# Patient Record
Sex: Female | Born: 1950 | Race: Black or African American | Hispanic: No | Marital: Married | Smoking: Former smoker
Health system: Southern US, Community
[De-identification: ages and names within clinical notes are randomized; demographics above are authoritative.]

## PROBLEM LIST (undated history)

## (undated) DIAGNOSIS — N179 Acute kidney failure, unspecified: Secondary | ICD-10-CM

## (undated) DIAGNOSIS — M7750 Other enthesopathy of unspecified foot: Secondary | ICD-10-CM

## (undated) DIAGNOSIS — D735 Infarction of spleen: Secondary | ICD-10-CM

## (undated) DIAGNOSIS — H269 Unspecified cataract: Secondary | ICD-10-CM

## (undated) DIAGNOSIS — M5030 Other cervical disc degeneration, unspecified cervical region: Secondary | ICD-10-CM

## (undated) DIAGNOSIS — R809 Proteinuria, unspecified: Secondary | ICD-10-CM

## (undated) DIAGNOSIS — Z87442 Personal history of urinary calculi: Secondary | ICD-10-CM

## (undated) DIAGNOSIS — D649 Anemia, unspecified: Secondary | ICD-10-CM

## (undated) DIAGNOSIS — M359 Systemic involvement of connective tissue, unspecified: Secondary | ICD-10-CM

## (undated) HISTORY — DX: Systemic involvement of connective tissue, unspecified: M35.9

## (undated) HISTORY — DX: Other cervical disc degeneration, unspecified cervical region: M50.30

## (undated) HISTORY — PX: CATARACT EXTRACTION, BILATERAL: SHX1313

## (undated) HISTORY — DX: Other enthesopathy of unspecified foot and ankle: M77.50

## (undated) HISTORY — DX: Unspecified cataract: H26.9

## (undated) HISTORY — PX: FOOT SURGERY: SHX648

---
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
## 2013-12-20 LAB — TSH: TSH: 1.33 u[IU]/mL (ref 0.41–5.90)

## 2013-12-20 LAB — CBC AND DIFFERENTIAL
HCT: 39 % (ref 36–46)
Hemoglobin: 12.3 g/dL (ref 12.0–16.0)
PLATELETS: 246 10*3/uL (ref 150–399)
WBC: 3.5 10*3/mL

## 2013-12-20 LAB — BASIC METABOLIC PANEL
BUN: 20 mg/dL (ref 4–21)
CREATININE: 0.8 mg/dL (ref 0.5–1.1)
GLUCOSE: 85 mg/dL
Potassium: 4.6 mmol/L (ref 3.4–5.3)
Sodium: 138 mmol/L (ref 137–147)

## 2013-12-20 LAB — HEPATIC FUNCTION PANEL
ALT: 13 U/L (ref 7–35)
AST: 15 U/L (ref 13–35)
Alkaline Phosphatase: 76 U/L (ref 25–125)

## 2014-06-17 ENCOUNTER — Other Ambulatory Visit: Payer: Self-pay | Admitting: Family Medicine

## 2015-01-16 ENCOUNTER — Other Ambulatory Visit: Payer: Self-pay | Admitting: Family Medicine

## 2015-01-17 ENCOUNTER — Other Ambulatory Visit: Payer: Self-pay | Admitting: Family Medicine

## 2015-01-17 ENCOUNTER — Telehealth: Payer: Self-pay | Admitting: Family Medicine

## 2015-01-17 NOTE — Telephone Encounter (Signed)
Patient is requesting a refill on Naproxen. She states that she takes it as needed. 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  Dr. Conley Rolls, I will pull this patient's chart and place in your box for review.   Thanks, [employer]

## 2015-01-18 NOTE — Telephone Encounter (Signed)
Jasmine-I have not seen the chart? Have you gotten it taken care of?  Thanks, Dr Conley RollsLe

## 2015-01-19 NOTE — Telephone Encounter (Signed)
Looks like MendonJasmine spoke with Pt. See previous message.

## 2015-01-19 NOTE — Telephone Encounter (Signed)
The chart is not filed back, or at the nurses station, or in Dr. Irwin BrakemanLe's box, nor is it in W/C @ 104.     Per Dr. Conley RollsLe - Aleve is the same thing as Naproxen.  Patient can take this.   It has been 11 months since last seen, if she is in need of additional treatment, she needs an OV.   Called patient LMOVM to call back.

## 2015-01-19 NOTE — Telephone Encounter (Signed)
[facility], not Dr. Irwin BrakemanLe's.  Last (and only) DOS is 03/06/2014. She fell and injured her wrist at work. [facility]?  I'll put the paper record in Dr. Irwin BrakemanLe's box, but I suspect the answer is that if she's still needing this, she needs to come in.

## 2015-01-19 NOTE — Telephone Encounter (Signed)
Patient returned call. Gave her the message from Dr. Conley RollsLe.

## 2015-01-19 NOTE — Telephone Encounter (Signed)
Hi, I distinctly remember putting her chart in your box on 01/17/2015 with the phone message attached to it. Perhaps medical records can help us figure this out?  Thanks, [employer]

## 2015-05-23 ENCOUNTER — Ambulatory Visit: Payer: Federal, State, Local not specified - PPO

## 2015-05-23 ENCOUNTER — Ambulatory Visit (INDEPENDENT_AMBULATORY_CARE_PROVIDER_SITE_OTHER): Payer: Federal, State, Local not specified - PPO | Admitting: Podiatry

## 2015-05-23 VITALS — BP 176/86 | HR 86 | Resp 18

## 2015-05-23 DIAGNOSIS — M21622 Bunionette of left foot: Secondary | ICD-10-CM | POA: Diagnosis not present

## 2015-05-23 DIAGNOSIS — R52 Pain, unspecified: Secondary | ICD-10-CM | POA: Diagnosis not present

## 2015-05-23 DIAGNOSIS — M205X9 Other deformities of toe(s) (acquired), unspecified foot: Secondary | ICD-10-CM

## 2015-05-23 NOTE — Progress Notes (Signed)
Subjective:    Patient ID: Madeline Harper, female    DOB: 1950/11/15, 65 y.o.   MRN: [id number]  HPI  65 year old female presents the office today with concerns of bilateral foot and the left >> and right. She states that she has a Taylor's bunion left side and she is previously seen in the podiatrist and she has tried shoe gear changes, padding, offloading without any resolution of symptoms and she is continuing to have pain at this time she'll have the one year removed. Recently she started to develop pain in between her fourth and fifth toes on her right foot. She states he had surgery to her fifth toe to shave the bone down and this helped for several years however she is turned have recurrence of pain at this time. She denies any recent injury or trauma. No swelling or redness. No claudication symptoms however she states that she is somewhat concerned about the circulation to her toes and she occasionally gets tingling. No other complaints at this time.  Review of Systems  All other systems reviewed and are negative.      Objective:   Physical Exam General: AAO x3, NAD  Dermatological: Skin is warm, dry and supple bilateral. Nails x 10 are well manicured; remaining integument appears unremarkable at this time. There are no open sores, no preulcerative lesions, no rash or signs of infection present.  Vascular: Dorsalis Pedis artery and Posterior Tibial artery pedal pulses are 2/4 bilateral with immedate capillary fill time. Pedal hair growth present. No varicosities and no lower extremity edema present bilateral. There is no pain with calf compression, swelling, warmth, erythema.   Neruologic: Grossly intact via light touch bilateral. Vibratory intact via tuning fork bilateral. Protective threshold with Semmes Wienstein monofilament intact to all pedal sites bilateral. Patellar and Achilles deep tendon reflexes 2+ bilateral. No Babinski or clonus noted bilateral.   Musculoskeletal: There is  a Taylor's bunion deformity present on the left side with exostosis as well as a palpable bursa off the lateral aspect of submetatarsal head. There is adductovarus of fourth and fifth toes with a right-sided worse the left. There is a small hyperkeratotic lesion on the right fourth interspace. There is no overlying edema, erythema, increase in warmth. There is no area pinpoint bony tenderness or pain the vibratory sensation. MMT 5/5, ROM WNL.   Gait: Unassisted, Nonantalgic.      Assessment & Plan:  65 year old female left symptomatic Taylor's bunion, right fourth and fifth adductovarus -Treatment options discussed including all alternatives, risks, and complications -Etiology of symptoms were discussed -Due to chronic liver x-rays now a new x-rays today. It does appear to be an exostosis off the lateral aspect of the fifth metatarsal head of the left side however there is no significant deformity to the metatarsal. Adductovarus and hammertoe deformity is also present.  -Discussed both conservative and surgical treatment options. At this time she was still off on surgery of the right-sided proceed with surgery in the left side with a Taylor's bunion. I discussed their exostectomy of the lateral aspect of submetatarsal head and possible osteotomy if worn tendon she went to proceed with this. Discussed with the postoperative course is variable depending on the procedure. We'll try for exostectomy alone. Discussed this is not a guarantee of resolution of symptoms there is a high chance of recurrence. -The incision placement as well as the postoperative course was discussed with the patient. I discussed risks of the surgery which include, but not limited to,  infection, bleeding, pain, swelling, need for further surgery, delayed or nonhealing, painful or ugly scar, numbness or sensation changes, over/under correction, recurrence, transfer lesions, further deformity, hardware failure, DVT/PE, loss of toe/foot.  Patient understands these risks and wishes to proceed with surgery. The surgical consent was reviewed with the patient all 3 pages were signed. No promises or guarantees were given to the outcome of the procedure. All questions were answered to the best of my ability. Before the surgery the patient was encouraged to call the office if there is any further questions. The surgery will be performed at the [facility] on an outpatient basis. -Given her symptoms and concern for vascular insufficiency Will order arterial studies Prior to surgery.  Ovid Curd, DPM

## 2015-05-23 NOTE — Patient Instructions (Signed)

## 2015-05-24 ENCOUNTER — Telehealth: Payer: Self-pay | Admitting: *Deleted

## 2015-05-24 NOTE — Telephone Encounter (Signed)
"  I'm calling because I was there yesterday.  I scheduled for my surgery on March 1st.  I just found out I have an appointment on March 3.  I already paid for tickets.  I'd like to reschedule if possible.  Please contact me.  I need to reschedule for the next available date.  Waiting to hear from you."

## 2015-05-24 NOTE — Telephone Encounter (Signed)
I'm returning your call.  We can reschedule your surgery to March 8th.  "March 8, that will be fine.  My husband reminded me we have a conference to go to.  Thank you so much."

## 2015-05-25 ENCOUNTER — Telehealth: Payer: Self-pay | Admitting: *Deleted

## 2015-05-25 DIAGNOSIS — R0989 Other specified symptoms and signs involving the circulatory and respiratory systems: Secondary | ICD-10-CM

## 2015-05-25 NOTE — Telephone Encounter (Addendum)
-----   Message from Vivi Barrack, DPM sent at 05/24/2015  3:41 PM EST ----- Can you order arterial studies? Thanks. 05/25/2015-I ASKED PT IF SHE was established with a cardiovascular doctor in [facility] and she stated no, and I offered to schedule with [facility] and pt accept.  Faxed orders to [facility].

## 2015-05-29 ENCOUNTER — Other Ambulatory Visit: Payer: Self-pay | Admitting: Podiatry

## 2015-05-29 DIAGNOSIS — R0989 Other specified symptoms and signs involving the circulatory and respiratory systems: Secondary | ICD-10-CM

## 2015-06-06 ENCOUNTER — Ambulatory Visit (HOSPITAL_COMMUNITY)
Admission: RE | Admit: 2015-06-06 | Discharge: 2015-06-06 | Disposition: A | Discharge status: Home / Self Care | Payer: Federal, State, Local not specified - PPO | Source: Ambulatory Visit | Attending: Podiatry | Admitting: Podiatry

## 2015-06-06 DIAGNOSIS — R938 Abnormal findings on diagnostic imaging of other specified body structures: Secondary | ICD-10-CM | POA: Diagnosis not present

## 2015-06-06 DIAGNOSIS — R0989 Other specified symptoms and signs involving the circulatory and respiratory systems: Secondary | ICD-10-CM | POA: Diagnosis not present

## 2015-06-06 DIAGNOSIS — R209 Unspecified disturbances of skin sensation: Secondary | ICD-10-CM | POA: Diagnosis not present

## 2015-06-07 ENCOUNTER — Encounter: Payer: Self-pay | Admitting: *Deleted

## 2015-06-07 ENCOUNTER — Telehealth: Payer: Self-pay | Admitting: *Deleted

## 2015-06-07 DIAGNOSIS — I739 Peripheral vascular disease, unspecified: Secondary | ICD-10-CM

## 2015-06-07 NOTE — Telephone Encounter (Signed)
I'm calling to inform you Dr. Ardelle Anton got your doppler results back.  He said over all your doppler study was normal.  However, your toe pressures were abnormal.  He wants to get medical clearance from the vascular doctor before he performs your surgery so we may have to postpone your surgery.  I sent a medical clearance letter to Dr. Eden Emms requesting the clearance.  "Okay that's fine.  Will I get to see him before I have surgery to go over the study in detail?"  Yes, Dr. Ardelle Anton said he wants to see you again prior to having surgery.  "So we'll schedule that at a later date.  That is fine."

## 2015-06-12 NOTE — Telephone Encounter (Signed)
"  I was called last Thursday.  I was told when I had my vascular, they saw some abnormalities in my toes.  They may have to reschedule my surgery.  No one has called me since.  My surgery is Wednesday.  I have no idea of what's going on.  Please give me a call and let me know what's going on."

## 2015-06-12 NOTE — Telephone Encounter (Signed)
"  Patient has been calling here all day.  She's at Dr. Landry DykeKelly's office.  She said she is unclear of what's supposed to be going on.  Can I connect you to her?"  Yes, I'll take the call.    Mrs. Eves your surgery has been canceled for Wednesday.  Dr. Ardelle AntonWagoner wants to make sure you have adequate blood flow to your foot before he does surgery.  I sent a letter to Dr. Eden EmmsNishan.  He said he can't give clearance because he's never seen you before.  So I'm going to send a letter to Dr. Gery PrayBarry and see if he'll give the okay.  I'm not sure he will or not without seeing you.  "I feel like I was left in limbo.  I don't know what's going on.  I have to make arrangements.  I've already taken time off from work.  I have people arranged to help me.  How do we get this process going?  I'm just frustrated.  So Dr. Tresa EndoKelly has nothing to do with all this?"  No, Dr. Tresa EndoKelly is not involved.  If you want I can send a referral to Dr. Allyson SabalBerry and get them to call and make you an appointment.  "When will he be able to see me?"  I'm not sure they will have to schedule you for whatever he has available.  "Do I need to call them and get this taken care of?  We need to get this moving."  I have to make the referral.  "So, you're going to take care of this?"  Yes, I'm getting ready to send the referral.  They'll call you to set up the appointment.

## 2015-06-14 ENCOUNTER — Encounter: Payer: Self-pay | Admitting: Podiatry

## 2015-06-14 ENCOUNTER — Encounter: Payer: Self-pay | Admitting: Cardiovascular Disease

## 2015-06-14 ENCOUNTER — Ambulatory Visit (INDEPENDENT_AMBULATORY_CARE_PROVIDER_SITE_OTHER): Payer: Federal, State, Local not specified - PPO | Admitting: Cardiovascular Disease

## 2015-06-14 VITALS — BP 150/90 | HR 98 | Ht 59.0 in | Wt 135.8 lb

## 2015-06-14 DIAGNOSIS — Z01818 Encounter for other preprocedural examination: Secondary | ICD-10-CM | POA: Diagnosis not present

## 2015-06-14 DIAGNOSIS — M779 Enthesopathy, unspecified: Secondary | ICD-10-CM | POA: Diagnosis not present

## 2015-06-14 DIAGNOSIS — M7750 Other enthesopathy of unspecified foot: Secondary | ICD-10-CM | POA: Insufficient documentation

## 2015-06-14 NOTE — Patient Instructions (Signed)
Medication Instructions:  Your physician recommends that you continue on your current medications as directed. Please refer to the Current Medication list given to you today.   Labwork: none  Testing/Procedures: none  Follow-Up: Follow up with Dr. Berry as needed.   Any Other Special Instructions Will Be Listed Below (If Applicable).     If you need a refill on your cardiac medications before your next appointment, please call your pharmacy.   

## 2015-06-14 NOTE — Assessment & Plan Note (Signed)
This Madeline Harper has a painful foot spur on her left foot. 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 She had Dopplers performed in our office revealed a right ABI of 1.2 left ABI of 1. Her right TBI was 0.84 and left 0.59. She denies claudication. She has palpable pedal pulses. I believe she can undergo a left foot surgical procedure at low vascular risk.

## 2015-06-14 NOTE — Progress Notes (Signed)
06/14/2015 Madeline Harper   03-Aug-1950  [id number]  Primary Physician Pcp Not In System Primary Cardiologist: Runell Gess MD Roseanne Reno   HPI:  Madeline Harper is a 66 year old married African-American female mother of 2, grandmother and 5 grandchildren referred by Dr. Ardelle Anton, her podiatrist, for vascular evaluation prior to elective left foot surgery for a bone spur. She has no cardiovascular risk factors. Her sister did have a myocardial infarction. She has never had a heart attack or stroke. She denies chest pain, shortness of breath or claudication. She does have a painful left foot spur. 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 She had Dopplers in the office that showed normal ABIs with a slightly lower left  TBI. She has palpable pedal pulses.. I believe that she can undergo her surgical procedure at low vascular risk. I will see her back as needed.   Current Outpatient Prescriptions  Medication Sig Dispense Refill  . calcium carbonate (CALCIUM 600) 600 MG TABS tablet Take 600 mg by mouth daily.    . Multiple Vitamins-Minerals (MULTI ADULT GUMMIES PO) Take 1 tablet by mouth daily.    . naproxen sodium (ANAPROX) 220 MG tablet Take 220 mg by mouth as needed.     No current facility-administered medications for this visit.    No Known Allergies  Social History   Social History  . Marital Status: Married    Spouse Name: N/A  . Number of Children: N/A  . Years of Education: N/A   Occupational History  . Not on file.   Social History Main Topics  . Smoking status: Never Smoker   . Smokeless tobacco: Not on file  . Alcohol Use: Not on file  . Drug Use: Not on file  . Sexual Activity: Not on file   Other Topics Concern  . Not on file   Social History Narrative  . No narrative on file     Review of Systems: General: negative for chills, fever, night sweats or weight changes.  Cardiovascular: negative for chest pain, dyspnea on  exertion, edema, orthopnea, palpitations, paroxysmal nocturnal dyspnea or shortness of breath Dermatological: negative for rash Respiratory: negative for cough or wheezing Urologic: negative for hematuria Abdominal: negative for nausea, vomiting, diarrhea, bright red blood per rectum, melena, or hematemesis Neurologic: negative for visual changes, syncope, or dizziness All other systems reviewed and are otherwise negative except as noted above.    Blood pressure 150/90, pulse 98, height  (1.499 m), weight 135 lb 12.8 oz (61.598 kg).  General appearance: alert and no distress Neck: no adenopathy, no carotid bruit, no JVD, supple, symmetrical, trachea midline and thyroid not enlarged, symmetric, no tenderness/mass/nodules Lungs: clear to auscultation bilaterally Heart: regular rate and rhythm, S1, S2 normal, no murmur, click, rub or gallop Extremities: extremities normal, atraumatic, no cyanosis or edema  EKG normal sinus rhythm at 98 without ST or T-wave changes. I personally reviewed this EKG  ASSESSMENT AND PLAN:   Bone spur of foot This Corro has a painful foot spur on her left foot. 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 She had Dopplers performed in our office revealed a right ABI of 1.2 left ABI of 1. Her right TBI was 0.84 and left 0.59. She denies claudication. She has palpable pedal pulses. I believe she can undergo a left foot surgical procedure at low vascular risk.      Runell Gess MD  Nicholes CalamityFACP,FACC,FAHA, FSCAI 06/14/2015 2:43 PM

## 2015-06-14 NOTE — Progress Notes (Signed)
Patient ID: Lester KinsmanDiane Harper, female   DOB: 06-05-1950, 65 y.o.   MRN: [id number] Last Wednesday, 3/1 I reviewed the vascular studies and the TBI is abnormal. As I am working on the toes for surgery I am hesitant on preforming this surgery without any clearance. On 06/07/15 I discussed this with our surgery coordinator, Madeline Harper in regards to setting up a referral and delaying surgery.

## 2015-06-15 ENCOUNTER — Telehealth: Payer: Self-pay | Admitting: *Deleted

## 2015-06-15 NOTE — Telephone Encounter (Signed)
I'm returning your call.  He can do your surgery on Wednesday of next week.  "That will be good, the sooner the better.  What time will I need to be there?"  Surgical center will call you a day or two prior to surgery date and give you the arrival time.  Do not eat or drink anything after midnight.  "Okay, thank you so much."

## 2015-06-15 NOTE — Telephone Encounter (Signed)
"  I'm calling about the scheduling of my surgery.  I got clearance from Dr. Allyson SabalBerry.  I want to know when I'll be able to have the surgery done on my foot.  I need it taken care of ASAP.  If you would give me a call back."

## 2015-06-15 NOTE — Addendum Note (Signed)
Addended by: Evans LanceSTOVER, Donni Oglesby W on: 06/15/2015 01:22 PM   Modules accepted: Orders

## 2015-06-19 ENCOUNTER — Telehealth: Payer: Self-pay | Admitting: *Deleted

## 2015-06-19 NOTE — Telephone Encounter (Signed)
"  I'm scheduled for surgery on Wednesday with Dr. Ardelle AntonWagoner.  What time am I supposed to be there?"  You need to call the surgical center.  There phone number is [phone].  "I don't call you, I call them?"  Yes, that is correct.

## 2015-06-21 ENCOUNTER — Encounter: Payer: Self-pay | Admitting: Podiatry

## 2015-06-21 DIAGNOSIS — M2012 Hallux valgus (acquired), left foot: Secondary | ICD-10-CM | POA: Diagnosis not present

## 2015-06-26 ENCOUNTER — Telehealth: Payer: Self-pay | Admitting: *Deleted

## 2015-06-26 NOTE — Telephone Encounter (Signed)
Pt states [facility] an app[facility], but doesn't know the location.  I gave pt the [address], [place], [place] [id number].

## 2015-06-28 ENCOUNTER — Encounter: Payer: Self-pay | Admitting: Podiatry

## 2015-06-28 ENCOUNTER — Ambulatory Visit (INDEPENDENT_AMBULATORY_CARE_PROVIDER_SITE_OTHER): Payer: Federal, State, Local not specified - PPO | Admitting: Podiatry

## 2015-06-28 ENCOUNTER — Ambulatory Visit (HOSPITAL_BASED_OUTPATIENT_CLINIC_OR_DEPARTMENT_OTHER)
Admission: RE | Admit: 2015-06-28 | Discharge: 2015-06-28 | Disposition: A | Discharge status: Home / Self Care | Payer: Federal, State, Local not specified - PPO | Source: Ambulatory Visit | Attending: Podiatry | Admitting: Podiatry

## 2015-06-28 DIAGNOSIS — M21622 Bunionette of left foot: Secondary | ICD-10-CM | POA: Insufficient documentation

## 2015-06-28 DIAGNOSIS — Z9889 Other specified postprocedural states: Secondary | ICD-10-CM

## 2015-06-28 NOTE — Progress Notes (Signed)
Patient ID: Lester KinsmanDiane Salser, female   DOB: [birth year], 65 y.o.   MRN: [id number]  Subjective: Lester KinsmanDiane Galluzzo is a 65 y.o. is seen today in office s/p left tailors bunionecotmy (exostectomy) preformed on 06/21/15. She states that overall she is doing well she's having no pain. She stopped taking Percocet about 3 defect of surgery and she has stopped taking ibuprofen. She is continued surgical shoe. He states that she was having pain to her right fifth toe ever since the surgery on the left with the pain is also resolved. No recent injury or trauma. No other complaints. Denies any systemic complaints such as fevers, chills, nausea, vomiting. No calf pain, chest pain, shortness of breath.   Objective: General: No acute distress, AAOx3  DP/PT pulses palpable 2/4, CRT < 3 sec to all digits.  Protective sensation intact. Motor function intact.  Right foot: Incision is well coapted without any evidence of dehiscence and sutures intact. There is no surrounding erythema, ascending cellulitis, fluctuance, crepitus, malodor, drainage/purulence. There is very minimal edema around the surgical site. There is no pain along the surgical site. She states she can "feel it" over the incision upon palpation but it does not hurt. No other areas of tenderness to bilateral lower extremities.  No other open lesions or pre-ulcerative lesions.  No pain with calf compression, swelling, warmth, erythema.   Assessment and Plan:  Status post right 5th metatarsal exostectomy for tailors bunion, doing well with no complications   -Treatment options discussed including all alternatives, risks, and complications -X-ray was ordered and reviewed. On this evaluation of the x-ray does appear to have a slightly more I am ankle for the fourth and fifth compared to x-rays that she brought the office however clinically she has significant improvement. There is no palpable bump present and there appears to be much better clinical result compared  to preoperatively. -Antibiotic ointment was applied followed by dry sterile dressing. Keep the dressing clean, dry, intact. -Continue surgical shoe. -Ice/elevation -Pain medication as needed. -Monitor for any clinical signs or symptoms of infection and DVT/PE and directed to call the office immediately should any occur or go to the ER. -Follow-up in 1 week for suture removal or sooner if any problems arise. In the meantime, encouraged to call the office with any questions, concerns, change in symptoms.   Ovid CurdMatthew Lucianna Ostlund, DPM

## 2015-07-05 ENCOUNTER — Ambulatory Visit (INDEPENDENT_AMBULATORY_CARE_PROVIDER_SITE_OTHER): Payer: Federal, State, Local not specified - PPO | Admitting: Podiatry

## 2015-07-05 ENCOUNTER — Encounter: Payer: Self-pay | Admitting: Podiatry

## 2015-07-05 VITALS — BP 156/99 | HR 94 | Resp 18

## 2015-07-05 DIAGNOSIS — M21622 Bunionette of left foot: Secondary | ICD-10-CM

## 2015-07-05 DIAGNOSIS — Z9889 Other specified postprocedural states: Secondary | ICD-10-CM

## 2015-07-05 NOTE — Progress Notes (Signed)
Patient ID: Madeline KinsmanDiane Zane, female   DOB: 05/14/1950, 65 y.o.   MRN: [id number]  Subjective: Madeline KinsmanDiane Apfel is a 65 y.o. is seen today in office s/p left tailors bunionecotmy (exostectomy) preformed on 06/21/15. She states that she is doing well. She does continue the surgical shoe. She gets some throbbing sensation of the gets swollen but she ices it and does help. She is not taking any pain medicine. No other complaints. Denies any systemic complaints such as fevers, chills, nausea, vomiting. No calf pain, chest pain, shortness of breath.   Objective: General: No acute distress, AAOx3  DP/PT pulses palpable 2/4, CRT < 3 sec to all digits.  Protective sensation intact. Motor function intact.  Right foot: Incision is well coapted without any evidence of dehiscence and sutures intact. There is no surrounding erythema, ascending cellulitis, fluctuance, crepitus, malodor, drainage/purulence. There is very mild edema around the surgical site. There is no pain along the surgical site.  No other areas of tenderness to bilateral lower extremities.  No other open lesions or pre-ulcerative lesions.  No pain with calf compression, swelling, warmth, erythema.   Assessment and Plan:  Status post right 5th metatarsal exostectomy for tailors bunion, doing well with no complications   -Treatment options discussed including all alternatives, risks, and complications -Sutures removed today without complications. Antibiotic ointment was applied followed by dry sterile dressing. She continued the bandage off tomorrow to start to shower. Apply antibiotic ointment over the incision and a Band-Aid. Once the scar form she can use cocoa butter or vitamin E cream over the incision daily to help with scarring. -Continue with ice and elevation to help with swelling -Monitor for any clinical signs or symptoms of infection and DVT/PE and directed to call the office immediately should any occur or go to the ER. -Follow-up in 2-3  weeks or sooner if any problems arise. In the meantime, encouraged to call the office with any questions, concerns, change in symptoms.   Ovid CurdMatthew Dayja Loveridge, DPM

## 2015-07-12 ENCOUNTER — Telehealth: Payer: Self-pay | Admitting: *Deleted

## 2015-07-12 NOTE — Telephone Encounter (Signed)
Pt states had sutures removed last week and it looks like the edges of the surgical site have opened up.  I asked pt if she had any drainage, redness or increased swelling, pt denies these symptoms, and says she can see healthy skin beneath.  I told pt that often with surgery the outer layer of the suture line becomes dry, and opens due to the cutting of the supply line for blood and nutrients between the surgical sides of the wound. I told pt to cover with lightly coated antibiotic ointment and gause after she shower and report any changes.  I offer to check her foot but she stated she felt it was okay.

## 2015-07-19 ENCOUNTER — Ambulatory Visit (INDEPENDENT_AMBULATORY_CARE_PROVIDER_SITE_OTHER): Payer: Federal, State, Local not specified - PPO | Admitting: Podiatry

## 2015-07-19 ENCOUNTER — Encounter: Payer: Self-pay | Admitting: Podiatry

## 2015-07-19 ENCOUNTER — Ambulatory Visit (HOSPITAL_BASED_OUTPATIENT_CLINIC_OR_DEPARTMENT_OTHER)
Admission: RE | Admit: 2015-07-19 | Discharge: 2015-07-19 | Disposition: A | Discharge status: Home / Self Care | Payer: Federal, State, Local not specified - PPO | Source: Ambulatory Visit | Attending: Podiatry | Admitting: Podiatry

## 2015-07-19 VITALS — BP 160/99 | HR 73 | Resp 18

## 2015-07-19 DIAGNOSIS — M21622 Bunionette of left foot: Secondary | ICD-10-CM | POA: Insufficient documentation

## 2015-07-19 DIAGNOSIS — Z09 Encounter for follow-up examination after completed treatment for conditions other than malignant neoplasm: Secondary | ICD-10-CM | POA: Insufficient documentation

## 2015-07-19 DIAGNOSIS — Z9889 Other specified postprocedural states: Secondary | ICD-10-CM

## 2015-07-19 NOTE — Progress Notes (Signed)
Patient ID: Madeline KinsmanDiane Hengst, female   DOB: 10-Jan-1951, 65 y.o.   MRN: [id number]  Subjective: Madeline KinsmanDiane Tiffany is a 65 y.o. is seen today in office s/p left tailors bunionecotmy (exostectomy) preformed on 06/21/15. She states that she is doing well on the incision has opened up somewhat. She has been using antibiotic ointment and this seems to be helping and is closing. She does continue the surgical shoe. She denies any pain to the area at this time. She is not taking any pain medicine. No other complaints. Denies any systemic complaints such as fevers, chills, nausea, vomiting. No calf pain, chest pain, shortness of breath.   Objective: General: No acute distress, AAOx3  DP/PT pulses palpable 2/4, CRT < 3 sec to all digits.  Protective sensation intact. Motor function intact.  Right foot: All incision there is a superficial dehiscence of the wound which appears to be epidermis. The underlying skin appears to be intact. There is localized edema to the area any erythema or increase in warmth. There is no drainage or pus. No before meals cellulitis. There is no malodor. No clinical signs of infection at this time. No tenderness along the incision of the surgical site. No other areas of tenderness to bilateral lower extremities at this time. No other open lesions or pre-ulcerative lesions.  No pain with calf compression, swelling, warmth, erythema.   Assessment and Plan:  Status post right 5th metatarsal exostectomy for tailors bunion, with superficial dehiscence  -Treatment options discussed including all alternatives, risks, and complications -Wound was debrided today. Continue antibiotic ointment dressing changes. Monitor for signs or symptoms of infection. Continue with surgical shoe. -Follow-up as scheduled. Call any questions or concerns in the meantime.  Ovid CurdMatthew Nikolai Wilczak, DPM

## 2015-07-24 NOTE — Progress Notes (Signed)
[facility], Tailors Bunionectomy left foot.  Prescription was written for Percocet 5/325, quantity of 30, Phenergan 12.5 mg, quantity of 30 and Keflex 500 mg, quantity 21.

## 2015-08-02 ENCOUNTER — Encounter: Payer: Self-pay | Admitting: Podiatry

## 2015-08-02 ENCOUNTER — Ambulatory Visit (INDEPENDENT_AMBULATORY_CARE_PROVIDER_SITE_OTHER): Payer: Federal, State, Local not specified - PPO | Admitting: Podiatry

## 2015-08-02 DIAGNOSIS — M21622 Bunionette of left foot: Secondary | ICD-10-CM

## 2015-08-02 DIAGNOSIS — L84 Corns and callosities: Secondary | ICD-10-CM | POA: Diagnosis not present

## 2015-08-07 NOTE — Progress Notes (Signed)
Patient ID: Madeline Harper, female   DOB: 1950/08/28, 65 y.o.   MRN: [id number]  Subjective: Madeline Harper is a 65 y.o. is seen today in office s/p left tailors bunionecotmy (exostectomy) preformed on 06/21/15. She states that she is been applying antibiotic ointment overlying the incision and a bandage daily. She has not been seen drainage or pus. No significant increase in swelling. No redness or red streaks. No pain to the area. She also has a callus on the right big toe that she would have trimmed today. No recent injury or trauma. No other complaints.  Denies any systemic complaints such as fevers, chills, nausea, vomiting. No calf pain, chest pain, shortness of breath.   Objective: General: No acute distress, AAOx3  DP/PT pulses palpable 2/4, CRT < 3 sec to all digits.  Protective sensation intact. Motor function intact.  Left foot: On the incision on the superficial area of dehiscence is a hyperkeratotic lesion over the incision. Upon debridement underlying skin appears to be intact. There is no drainage or pus. There is trace edema around the area of the any increase in warmth or erythema. No ascending cellulitis. No malodor. There is no tenderness on the surgical site. Right foot: Hyperkeratotic lesion plantar hallux. Upon debridement no underlying ulceration, drainage or other signs of infection. No other open lesions or pre-ulcerative lesions.  No pain with calf compression, swelling, warmth, erythema.   Assessment and Plan:  Status post right 5th metatarsal exostectomy for tailors bunion, with healed wound: Right hyperkeratotic lesion  -Treatment options discussed including all alternatives, risks, and complications -Wound was debrided today. Certainly stress or over the incision. Hyperkeratotic tissue is slightly debrided today. Continue the bandages needed. -Callus right big toe debrided without complications or bleeding -At this point she can return back to work next week. Note  provided. -Follow-up as scheduled. Call any questions or concerns in the meantime.  Ovid CurdMatthew Taylah Dubiel, DPM

## 2015-08-30 ENCOUNTER — Ambulatory Visit: Payer: Federal, State, Local not specified - PPO | Admitting: Podiatry

## 2015-09-13 ENCOUNTER — Ambulatory Visit: Payer: Federal, State, Local not specified - PPO | Admitting: Podiatry

## 2016-02-27 DIAGNOSIS — M25511 Pain in right shoulder: Secondary | ICD-10-CM | POA: Insufficient documentation

## 2016-02-27 DIAGNOSIS — M25512 Pain in left shoulder: Secondary | ICD-10-CM

## 2016-02-27 DIAGNOSIS — R768 Other specified abnormal immunological findings in serum: Secondary | ICD-10-CM | POA: Insufficient documentation

## 2016-02-27 NOTE — Progress Notes (Signed)
Office Visit Note  Patient: Madeline Harper             Date of Birth: Apr 08, 1951           MRN: [id number]             PCP: Nilda Simmer, MD Referring: Tamala Julian, MD Visit Date: 02/28/2016 Occupation: Sales person    Subjective:  Pain in bilateral shoulders   History of Present Illness: Madeline Harper is a 65 y.o. right-handed black female seen in consultation per request of Dr. [place] Desanctis. Patient states that one year ago she started having pain and discomfort in her right shoulder for which she was seen by an orthopedic doctor. After the cortisone injection in her right shoulder joint symptoms resolved. She did well until 2 months ago. She states she woke up one day with pain and discomfort in her bilateral shoulders difficulty getting up from bed, dressing and raising her arms. She has severe nocturnal pain. She was seen by Dr. [place] Desanctis and had cortisone injection to bilateral shoulders. She states the symptoms were relieved only for a few days and then recurred. She was given a prednisone taper starting at 60 mg for 1 week. She states after the prednisone taper finished her symptoms recurred. She was given his second taper of prednisone at 60 mg for 2 weeks. She states she finished it today she is at 5 mg now her shoulder joint pain is much better and she can move her arms up without any difficulty. She states during this time she never developed any other joint involvement. She does have some problems with right second and third trigger finger. She denies any hip pain or any difficulty getting out of the chair.  Activities of Daily Living:  Patient reports morning stiffness for 2 hours.   Patient Reports nocturnal pain.  Difficulty dressing/grooming: Reports Difficulty climbing stairs: Denies Difficulty getting out of chair: Denies Difficulty using hands for taps, buttons, cutlery, and/or writing: Denies   Review of Systems  Constitutional: Positive for fatigue. Negative for night sweats,  weight gain, weight loss and weakness.  HENT: Negative for mouth sores, trouble swallowing, trouble swallowing, mouth dryness and nose dryness.   Eyes: Negative for pain, redness, visual disturbance and dryness.  Respiratory: Negative for cough, shortness of breath and difficulty breathing.   Cardiovascular: Negative for chest pain, palpitations, hypertension, irregular heartbeat and swelling in legs/feet.  Gastrointestinal: Negative for blood in stool, constipation and diarrhea.  Endocrine: Negative for increased urination.  Genitourinary: Negative for vaginal dryness.  Musculoskeletal: Positive for arthralgias, joint pain, myalgias and myalgias. Negative for joint swelling, muscle weakness, morning stiffness and muscle tenderness.  Skin: Negative for color change, rash, hair loss, skin tightness, ulcers and sensitivity to sunlight.  Allergic/Immunologic: Negative for susceptible to infections.  Neurological: Negative for dizziness, memory loss and night sweats.  Hematological: Negative for swollen glands.  Psychiatric/Behavioral: Positive for sleep disturbance. Negative for depressed mood. The patient is not nervous/anxious.     PMFS History:  Patient Active Problem List   Diagnosis Date Noted  . Elevated sedimentation rate 02/28/2016  . Positive ANA (antinuclear antibody) 02/27/2016  . Bilateral shoulder pain 02/27/2016  . Status post left foot surgery 06/28/2015  . Tailor's bunion of left foot 06/28/2015  . Bone spur of foot 06/14/2015    Past Medical History:  Diagnosis Date  . Bone spur of foot     Family History  Problem Relation Age of Onset  . Diabetes Mother   .  Heart disease Mother   . Kidney failure Mother   . Cancer Mother     Uterine Cancer  . Diabetes Maternal Grandmother   . Kidney failure Maternal Grandmother   . Stroke Sister   . Gout Maternal Grandfather    Past Surgical History:  Procedure Laterality Date  . [place]   Social History    Social History Narrative  . No narrative on file     Objective: Vital Signs: BP (!) 174/90 (BP Location: Right Arm, Patient Position: Sitting, Cuff Size: Small)   Pulse 85   Resp 14   Ht _0  (1.499 m)   Wt 137 lb (62.1 kg)   BMI 27.67 kg/m    Physical Exam  Constitutional: She is oriented to person, place, and time. She appears well-developed and well-nourished.  HENT:  Head: Normocephalic and atraumatic.  Eyes: Conjunctivae and EOM are normal.  Neck: Normal range of motion.  Cardiovascular: Normal rate, regular rhythm, normal heart sounds and intact distal pulses.   Pulmonary/Chest: Effort normal and breath sounds normal.  Abdominal: Soft. Bowel sounds are normal.  Lymphadenopathy:    She has no cervical adenopathy.  Neurological: She is alert and oriented to person, place, and time.  Skin: Skin is warm and dry. Capillary refill takes less than 2 seconds.  Psychiatric: She has a normal mood and affect. Her behavior is normal.  Nursing note and vitals reviewed.    Musculoskeletal Exam: C-spine, thoracic, lumbar spine good range of motion. Shoulder joints, elbow joints, wrist joints, MCPs PIPs DIPs with good range of motion with no synovitis. Hip joints knee joints ankle joints MTPs PIPs with good range of motion with no synovitis she has no difficulty getting out of chair.  CDAI Exam: No CDAI exam completed.    Investigation: Findings:  01/09/2016: ESR 115, C-reactive protein 1.5 normal, rheumatoid factor 15.9 high ,ANA > 1:1280 NS HLA-B27 negative, uric acid 7.4 high    Imaging: No results found.  Speciality Comments: No specialty comments available.    Procedures:  No procedures performed Allergies: Lyrica [pregabalin]   Assessment / Plan:     Visit Diagnoses: Elevated sedimentation rate: She has very high sedimentation rate. It is usually associated with polymyalgia rheumatica she gives history of pain in bilateral shoulder joints but none in her hip  joints. She denies any history of difficulty getting up from chair or muscle weakness or muscle pain. I would repeat her sedimentation rate today.  Positive ANA (antinuclear antibody) -her ANA is high titer she also gives history of arthritis but has no other clinical features of autoimmune disease I will obtain following labs today. Plan: CBC with Differential/Platelet, COMPLETE METABOLIC PANEL WITH GFR, Urinalysis, Routine w reflex microscopic (not at [facility]), Sedimentation rate, CK, TSH, Cyclic citrul peptide antibody, IgG, Sjogrens syndrome-B extractable nuclear antibody, Lupus anticoagulant panel, RNP Antibody, Sjogrens syndrome-A extractable nuclear antibody, Anti-scleroderma antibody, Anti-DNA antibody, double-stranded, C3 and C4, Glucose 6 phosphate dehydrogenase, Protein electrophoresis, serum, IgG, IgA, IgM, Hepatitis panel, acute  Pain of both shoulders: She's been having recurrent pain in her bilateral shoulders for the last 2 months. She has had 2 high-dose prednisone taper. She is doing fairly well today. I'll keep her on 10 mg of prednisone for right now until we have her lab results back. At this point my differential will be polymyalgia versus autoimmune disease. I've given her a handout on Plaquenil to review in case her labs come positive it may also be helpful  to taper prednisone in future.  Trigger finger, right middle finger - Right second and third : They're not very symptomatic currently.  Her blood pressure was elevated today> I have advised her to follow follow-up with PCP as the prednisone could be causing elevation of blood pressure.   Orders: Orders Placed This Encounter  Procedures  . CBC with Differential/Platelet  . COMPLETE METABOLIC PANEL WITH GFR  . Urinalysis, Routine w reflex microscopic (not at [facility])  . Sedimentation rate  . CK  . TSH  . Cyclic citrul peptide antibody, IgG  . Sjogrens syndrome-B extractable nuclear antibody  . Lupus anticoagulant panel  .  RNP Antibody  . Sjogrens syndrome-A extractable nuclear antibody  . Anti-scleroderma antibody  . Anti-DNA antibody, double-stranded  . C3 and C4  . Glucose 6 phosphate dehydrogenase  . Protein electrophoresis, serum  . IgG, IgA, IgM  . Hepatitis panel, acute   Meds ordered this encounter  Medications  . predniSONE (DELTASONE) 10 MG tablet    Sig: Take 1 tablet (10 mg total) by mouth daily with breakfast.    Dispense:  30 tablet    Refill:  0    Face-to-face time spent with patient was 60 minutes. 50% of time was spent in counseling and coordination of care.  Follow-Up Instructions: Return for [place]ias.   Bo Merino, MD

## 2016-02-28 ENCOUNTER — Telehealth: Payer: Self-pay | Admitting: Rheumatology

## 2016-02-28 ENCOUNTER — Encounter: Payer: Self-pay | Admitting: Rheumatology

## 2016-02-28 ENCOUNTER — Ambulatory Visit (INDEPENDENT_AMBULATORY_CARE_PROVIDER_SITE_OTHER): Payer: Federal, State, Local not specified - PPO | Admitting: Rheumatology

## 2016-02-28 VITALS — BP 174/90 | HR 85 | Resp 14 | Ht 59.0 in | Wt 137.0 lb

## 2016-02-28 DIAGNOSIS — G8929 Other chronic pain: Secondary | ICD-10-CM | POA: Diagnosis not present

## 2016-02-28 DIAGNOSIS — M65331 Trigger finger, right middle finger: Secondary | ICD-10-CM | POA: Diagnosis not present

## 2016-02-28 DIAGNOSIS — R700 Elevated erythrocyte sedimentation rate: Secondary | ICD-10-CM | POA: Diagnosis not present

## 2016-02-28 DIAGNOSIS — R768 Other specified abnormal immunological findings in serum: Secondary | ICD-10-CM

## 2016-02-28 DIAGNOSIS — M25512 Pain in left shoulder: Secondary | ICD-10-CM | POA: Diagnosis not present

## 2016-02-28 DIAGNOSIS — M25511 Pain in right shoulder: Secondary | ICD-10-CM

## 2016-02-28 LAB — CBC WITH DIFFERENTIAL/PLATELET
BASOS PCT: 0 %
Basophils Absolute: 0 cells/uL (ref 0–200)
EOS PCT: 1 %
Eosinophils Absolute: 78 cells/uL (ref 15–500)
HCT: 38 % (ref 35.0–45.0)
Hemoglobin: 12 g/dL (ref 11.7–15.5)
LYMPHS PCT: 8 %
Lymphs Abs: 624 cells/uL — ABNORMAL LOW (ref 850–3900)
MCH: 27.5 pg (ref 27.0–33.0)
MCHC: 31.6 g/dL — AB (ref 32.0–36.0)
MCV: 87.2 fL (ref 80.0–100.0)
MONOS PCT: 6 %
MPV: 10.7 fL (ref 7.5–12.5)
Monocytes Absolute: 468 cells/uL (ref 200–950)
Neutro Abs: 6630 cells/uL (ref 1500–7800)
Neutrophils Relative %: 85 %
PLATELETS: 282 10*3/uL (ref 140–400)
RBC: 4.36 MIL/uL (ref 3.80–5.10)
RDW: 15.7 % — AB (ref 11.0–15.0)
WBC: 7.8 10*3/uL (ref 3.8–10.8)

## 2016-02-28 LAB — TSH: TSH: 0.75 mIU/L

## 2016-02-28 MED ORDER — PREDNISONE 10 MG PO TABS: 10.0000 mg | ORAL_TABLET | Freq: Every day | ORAL | 0 refills | Status: DC

## 2016-02-28 NOTE — Patient Instructions (Signed)

## 2016-02-28 NOTE — Telephone Encounter (Signed)
[facility] in [facility] called about Prednisone rx that they just received. They have questions about the rx. Please call. [phone]

## 2016-02-28 NOTE — Telephone Encounter (Signed)
Clarified with pharmacy that 10mg  is the correct dose, reviewed Dr Corliss Skainseveshwar note and discussed with pharmacist

## 2016-02-29 LAB — COMPLETE METABOLIC PANEL WITH GFR
ALT: 30 U/L — AB (ref 6–29)
AST: 16 U/L (ref 10–35)
Albumin: 3.7 g/dL (ref 3.6–5.1)
Alkaline Phosphatase: 83 U/L (ref 33–130)
BUN: 18 mg/dL (ref 7–25)
CHLORIDE: 103 mmol/L (ref 98–110)
CO2: 23 mmol/L (ref 20–31)
CREATININE: 0.75 mg/dL (ref 0.50–0.99)
Calcium: 9.3 mg/dL (ref 8.6–10.4)
GFR, Est African American: >89 mL/min (ref >=60)
GFR, Est Non African American: 85 mL/min (ref >=60)
Glucose, Bld: 102 mg/dL — ABNORMAL HIGH (ref 65–99)
POTASSIUM: 4.4 mmol/L (ref 3.5–5.3)
Sodium: 140 mmol/L (ref 135–146)
Total Bilirubin: 0.5 mg/dL (ref 0.2–1.2)
Total Protein: 7.5 g/dL (ref 6.1–8.1)

## 2016-02-29 LAB — HEPATITIS PANEL, ACUTE
HCV Ab: NEGATIVE
HEP A IGM: NONREACTIVE
HEP B S AG: NEGATIVE
Hep B C IgM: NONREACTIVE

## 2016-02-29 LAB — CK: CK TOTAL: 28 U/L (ref 7–177)

## 2016-02-29 LAB — URINALYSIS, ROUTINE W REFLEX MICROSCOPIC
Bilirubin Urine: NEGATIVE
Glucose, UA: NEGATIVE
Hgb urine dipstick: NEGATIVE
KETONES UR: NEGATIVE
Leukocytes, UA: NEGATIVE
NITRITE: NEGATIVE
PH: 7 (ref 5.0–8.0)
Protein, ur: NEGATIVE
SPECIFIC GRAVITY, URINE: 1.018 (ref 1.001–1.035)

## 2016-02-29 LAB — SEDIMENTATION RATE: SED RATE: 38 mm/h — AB (ref 0–30)

## 2016-03-01 LAB — C3 AND C4
C3 Complement: 40 mg/dL — ABNORMAL LOW (ref 90–180)
C4 COMPLEMENT: 8 mg/dL — AB (ref 16–47)

## 2016-03-01 LAB — SJOGRENS SYNDROME-A EXTRACTABLE NUCLEAR ANTIBODY: SSA (Ro) (ENA) Antibody, IgG: 5.7 — ABNORMAL HIGH

## 2016-03-01 LAB — IGG, IGA, IGM
IGA: 383 mg/dL (ref 81–463)
IGG (IMMUNOGLOBIN G), SERUM: 2174 mg/dL — AB (ref 694–1618)
IGM, SERUM: 263 mg/dL (ref 48–271)

## 2016-03-01 LAB — ANTI-SCLERODERMA ANTIBODY: SCLERODERMA (SCL-70) (ENA) ANTIBODY, IGG: NEGATIVE

## 2016-03-01 LAB — SJOGRENS SYNDROME-B EXTRACTABLE NUCLEAR ANTIBODY: SSB (LA) (ENA) ANTIBODY, IGG: NEGATIVE

## 2016-03-01 LAB — GLUCOSE 6 PHOSPHATE DEHYDROGENASE: G-6PDH: 8 U/g{Hb} (ref 4.6–13.5)

## 2016-03-01 LAB — RNP ANTIBODY: Ribonucleic Protein(ENA) Antibody, IgG: >8 — ABNORMAL HIGH

## 2016-03-01 LAB — CYCLIC CITRUL PEPTIDE ANTIBODY, IGG

## 2016-03-01 LAB — ANTI-DNA ANTIBODY, DOUBLE-STRANDED: ds DNA Ab: 14 IU/mL — ABNORMAL HIGH

## 2016-03-02 LAB — RFX DRVVT SCR W/RFLX CONF 1:1 MIX: dRVVT Screen: 31 s (ref <=45)

## 2016-03-02 LAB — RFX PTT-LA W/RFX TO HEX PHASE CONF: PTT-LA Screen: 24 s (ref <=40)

## 2016-03-02 LAB — LUPUS ANTICOAGULANT PANEL

## 2016-03-04 LAB — PROTEIN ELECTROPHORESIS, SERUM
ALBUMIN ELP: 3.8 g/dL (ref 3.8–4.8)
Alpha-1-Globulin: 0.3 g/dL (ref 0.2–0.3)
Alpha-2-Globulin: 0.8 g/dL (ref 0.5–0.9)
BETA GLOBULIN: 0.4 g/dL (ref 0.4–0.6)
Beta 2: 0.4 g/dL (ref 0.2–0.5)
Gamma Globulin: 1.8 g/dL — ABNORMAL HIGH (ref 0.8–1.7)
TOTAL PROTEIN, SERUM ELECTROPHOR: 7.5 g/dL (ref 6.1–8.1)

## 2016-03-06 ENCOUNTER — Telehealth: Payer: Self-pay | Admitting: Radiology

## 2016-03-06 NOTE — Telephone Encounter (Signed)
-----   Message from Caffie DammeAmy W Littrell, RT sent at 03/05/2016  4:52 PM EST ----- Call patient and discuss PLQ per Dr Corliss Skainseveshwar

## 2016-03-06 NOTE — Telephone Encounter (Signed)
Dr Corliss Skainseveshwar wants us to discuss PLQ with patient. She states she would like for you to discuss the PLQ with her over the phone. I have called her to advise her the labs will be reviewed in detail in the office in follow up and are consistent with Lupus, and told her you will call with the information regarding new medication Dr Corliss Skainseveshwar has recommended.

## 2016-03-07 NOTE — Telephone Encounter (Signed)
Contacted patient regarding hydroxychloroquine.  Patient was counseled on the purpose, proper use, and adverse effects of hydroxychloroquine including nausea/diarrhea, skin rash, headaches, and sun sensitivity.  Discussed importance of annual eye exams while on hydroxychloroquine to monitor for ocular toxicity and discussed importance of frequent laboratory monitoring.  Answered patients questions regarding hydroxychloroquine.  Patient verbally agreed to use of hydroxychloroquine.  Will mail educational information to patient along with consent form.  Will also mail patient the plaquenil eye exam form.  Advised patient to mail consent back to our office once she receives it.  Patient voiced understanding.  Will initiate hydroxychloroquine once consent is received.     Lilla Shookachel Jayanth Szczesniak, Pharm.D., BCPS Clinical Pharmacist Pager: [phone] Phone: [phone] 03/07/2016 10:24 AM

## 2016-03-14 ENCOUNTER — Telehealth: Payer: Self-pay | Admitting: Pharmacist

## 2016-03-14 NOTE — Telephone Encounter (Signed)
I mailed patient information and consent on hydroxychloroquine on 03/07/16.  I called patient to follow up to ensure she received the letter and to see if she had any questions on the medication.  Patient did not answer the phone.  I left a voicemail requesting patient return my phone call.      [name], Pharm.D., BCPS Clinical Pharmacist Pager: [phone] Phone: [phone] 03/14/2016 1:20 PM

## 2016-03-14 NOTE — Telephone Encounter (Signed)
Returned patient's call.  Patient did confirm that she received the information and consent on hydroxychloroquine in the mail yesterday.  Patient appears anxious about starting a new medications.  Discussed the purpose, proper use, and adverse effects of hydroxychloroquine with patient.  Patient wanted to know why she needed to start a new medication as she states she is doing really well on prednisone right now.  Reviewed adverse effects of prednisone with patient including effect on blood glucose, blood pressure, risk of osteoporosis, and thinning of skin.  Reviewed with patient that we try to use prednisone in the lowest dose possible for the shortest duration possible.  Patient voiced understanding and reports she plans to mail hydroxychloroquine consent back to our office.  Advised patient to call if she has any other questions or concerns regarding hydroxychloroquine.    Lilla Shookachel Henderson, Pharm.D., BCPS Clinical Pharmacist Pager: [phone] Phone: [phone] 03/14/2016 1:52 PM

## 2016-03-14 NOTE — Telephone Encounter (Signed)
Patient returned Rachel's phone call. Please call back soon, patient states she is at work

## 2016-03-18 ENCOUNTER — Telehealth: Payer: Self-pay | Admitting: Rheumatology

## 2016-03-18 NOTE — Telephone Encounter (Signed)
Patient left a message for Fleet ContrasRachel stating that she sent forms in on Friday and if you have any questions please give her a call. Cb# [phone]

## 2016-03-20 ENCOUNTER — Telehealth: Payer: Self-pay | Admitting: Rheumatology

## 2016-03-20 NOTE — Telephone Encounter (Signed)
Patient left message asking for Fleet ContrasRachel to please call her regarding an authorization.

## 2016-03-20 NOTE — Telephone Encounter (Signed)
I called patient back.  Patient asked if we had received her consent form in the mail.  I informed her we have not received it yet, but we will let her know when we do.    Lilla Shookachel Stepan Verrette, Pharm.D., BCPS Clinical Pharmacist Pager: [phone] Phone: [phone] 03/20/2016 4:51 PM

## 2016-03-26 NOTE — Progress Notes (Signed)
Office Visit Note  Patient: Madeline Harper             Date of Birth: 14-Oct-1950           MRN: [id number]             PCP: Nilda Simmer, MD Referring: Kennon Holter, M.D. Visit Date: 03/27/2016 Occupation: Sales person    Subjective:  Bilateral shoulder pain   History of Present Illness: Madeline Harper is a 65 y.o. female she was recently evaluated for elevated sedimentation rate and arthralgias. She also had positive ANA. She had done a few prednisone taper with good response but recurrence of symptoms after each prednisone taper. She was started on prednisone 10 mg a day last visit and was left on that dose. She denies any joint pain on prednisone 10 mg a day. She continues to have some problems with Raynauds phenomenon. She denies any joint swelling. She continues to have some problems with right second and third trigger finger. She complains of pruritus in her left foot. She reports that about a year ago she developed upper respiratory infection which resolved but she continues to have some  cough with yellow sputum since then.  Activities of Daily Living:  Patient reports morning stiffness for 0 minute.   Patient Denies nocturnal pain.  Difficulty dressing/grooming: Denies Difficulty climbing stairs: Denies Difficulty getting out of chair: Denies Difficulty using hands for taps, buttons, cutlery, and/or writing: Denies   Review of Systems  Constitutional: Positive for fatigue and weight gain. Negative for night sweats, weight loss and weakness.  HENT: Negative for mouth sores, trouble swallowing, trouble swallowing, mouth dryness and nose dryness.   Eyes: Negative for pain, redness, visual disturbance and dryness.  Respiratory: Negative for cough, shortness of breath and difficulty breathing.   Cardiovascular: Negative for chest pain, palpitations, hypertension, irregular heartbeat and swelling in legs/feet.  Gastrointestinal: Negative for blood in stool, constipation and diarrhea.    Endocrine: Negative for increased urination.  Genitourinary: Negative for vaginal dryness.  Musculoskeletal: Positive for arthralgias and joint pain. Negative for joint swelling, myalgias, muscle weakness, morning stiffness, muscle tenderness and myalgias.  Skin: Negative for color change, rash, hair loss, skin tightness, ulcers and sensitivity to sunlight.  Allergic/Immunologic: Negative for susceptible to infections.  Neurological: Negative for dizziness, memory loss and night sweats.  Hematological: Negative for swollen glands.  Psychiatric/Behavioral: Negative for depressed mood and sleep disturbance. The patient is not nervous/anxious.     PMFS History:  Patient Active Problem List   Diagnosis Date Noted  . Autoimmune disease ([place]) 03/27/2016  . Bilateral foot pain 03/27/2016  . Trigger index finger of right hand 03/27/2016  . Elevated sedimentation rate 02/28/2016  . Positive ANA (antinuclear antibody) 02/27/2016  . Bilateral shoulder pain 02/27/2016  . Status post left foot surgery 06/28/2015  . Tailor's bunion of left foot 06/28/2015  . Bone spur of foot 06/14/2015    Past Medical History:  Diagnosis Date  . Bone spur of foot     Family History  Problem Relation Age of Onset  . Diabetes Mother   . Heart disease Mother   . Kidney failure Mother   . Cancer Mother     Uterine Cancer  . Diabetes Maternal Grandmother   . Kidney failure Maternal Grandmother   . Stroke Sister   . Gout Maternal Grandfather    Past Surgical History:  Procedure Laterality Date  . [place]   Social History   Social History  Narrative  . No narrative on file     Objective: Vital Signs: BP 134/84   Pulse 82   Resp 16   Ht 4' 11"  (1.499 m)   Wt 139 lb (63 kg)   BMI 28.07 kg/m    Physical Exam  Constitutional: She is oriented to person, place, and time. She appears well-developed and well-nourished.  HENT:  Head: Normocephalic and atraumatic.  Eyes:  Conjunctivae and EOM are normal.  Neck: Normal range of motion.  Cardiovascular: Normal rate, regular rhythm, normal heart sounds and intact distal pulses.   Pulmonary/Chest: Effort normal. She has rales.  Rales in bilateral lung fields  Abdominal: Soft. Bowel sounds are normal.  Lymphadenopathy:    She has no cervical adenopathy.  Neurological: She is alert and oriented to person, place, and time.  Skin: Skin is warm and dry. Capillary refill takes less than 2 seconds.  Psychiatric: She has a normal mood and affect. Her behavior is normal.  Nursing note and vitals reviewed.    Musculoskeletal Exam: C-spine, thoracic, lumbar spine good range of motion. shoulder joints, elbow joints, wrist joints, MCPs, PIPs, DIPs with good range of motion with no synovitis. Hip joints knee joints some ankle joints MTPs PIPs DIPs with good range of motion with no synovitis. No muscular weakness on examination today.  CDAI Exam: No CDAI exam completed.    Investigation: Findings:  01/09/2016: ESR 115, C-reactive protein 1.5 normal, rheumatoid factor 15.9 high ,ANA > 1:1280 NS HLA-B27 negative, uric acid 7.4 high 02/28/2016 CBC normal, CMP normal except ALT 30, UA negative, CK normal, TSH normal, ESR 38, CCP negative, dsDNA +14, RNP positive, Ro positive, La negative, SCL 70 negative, lupus anticoagulant negative, C3 low at 40, C4 low at 8, SPEP normal, IgG 2174 elevated, hepatitis panel negative, G6PD normal   Office Visit on 02/28/2016  Component Date Value Ref Range Status  . WBC 02/28/2016 7.8  3.8 - 10.8 K/uL Final  . RBC 02/28/2016 4.36  3.80 - 5.10 MIL/uL Final  . Hemoglobin 02/28/2016 12.0  11.7 - 15.5 g/dL Final  . HCT 02/28/2016 38.0  35.0 - 45.0 % Final  . MCV 02/28/2016 87.2  80.0 - 100.0 fL Final  . MCH 02/28/2016 27.5  27.0 - 33.0 pg Final  . MCHC 02/28/2016 31.6* 32.0 - 36.0 g/dL Final  . RDW 02/28/2016 15.7* 11.0 - 15.0 % Final  . Platelets 02/28/2016 282  140 - 400 K/uL Final  . MPV  02/28/2016 10.7  7.5 - 12.5 fL Final  . Neutro Abs 02/28/2016 6630  1,500 - 7,800 cells/uL Final  . Lymphs Abs 02/28/2016 624* 850 - 3,900 cells/uL Final  . Monocytes Absolute 02/28/2016 468  200 - 950 cells/uL Final  . Eosinophils Absolute 02/28/2016 78  15 - 500 cells/uL Final  . Basophils Absolute 02/28/2016 0  0 - 200 cells/uL Final  . Neutrophils Relative % 02/28/2016 85  % Final  . Lymphocytes Relative 02/28/2016 8  % Final  . Monocytes Relative 02/28/2016 6  % Final  . Eosinophils Relative 02/28/2016 1  % Final  . Basophils Relative 02/28/2016 0  % Final  . Smear Review 02/28/2016 Criteria for review not met   Final  . Sodium 02/29/2016 140  135 - 146 mmol/L Final  . Potassium 02/29/2016 4.4  3.5 - 5.3 mmol/L Final  . Chloride 02/29/2016 103  98 - 110 mmol/L Final  . CO2 02/29/2016 23  20 - 31 mmol/L Final  . Glucose, Bld 02/29/2016  102* 65 - 99 mg/dL Final  . BUN 02/29/2016 18  7 - 25 mg/dL Final  . Creat 02/29/2016 0.75  0.50 - 0.99 mg/dL Final   Comment:   For patients > or = 64 years of age: The upper reference limit for Creatinine is approximately 13% higher for people identified as African-American.     . Total Bilirubin 02/29/2016 0.5  0.2 - 1.2 mg/dL Final  . Alkaline Phosphatase 02/29/2016 83  33 - 130 U/L Final  . AST 02/29/2016 16  10 - 35 U/L Final  . ALT 02/29/2016 30* 6 - 29 U/L Final  . Total Protein 02/29/2016 7.5  6.1 - 8.1 g/dL Final  . Albumin 02/29/2016 3.7  3.6 - 5.1 g/dL Final  . Calcium 02/29/2016 9.3  8.6 - 10.4 mg/dL Final  . GFR, Est African American 02/29/2016 >89  >=60 mL/min Final  . GFR, Est Non African American 02/29/2016 85  >=60 mL/min Final  . Color, Urine 02/29/2016 YELLOW  YELLOW Final  . APPearance 02/29/2016 CLEAR  CLEAR Final  . Specific Gravity, Urine 02/29/2016 1.018  1.001 - 1.035 Final  . pH 02/29/2016 7.0  5.0 - 8.0 Final  . Glucose, UA 02/29/2016 NEGATIVE  NEGATIVE Final  . Bilirubin Urine 02/29/2016 NEGATIVE  NEGATIVE Final    . Ketones, ur 02/29/2016 NEGATIVE  NEGATIVE Final  . Hgb urine dipstick 02/29/2016 NEGATIVE  NEGATIVE Final  . Protein, ur 02/29/2016 NEGATIVE  NEGATIVE Final  . Nitrite 02/29/2016 NEGATIVE  NEGATIVE Final  . Leukocytes, UA 02/29/2016 NEGATIVE  NEGATIVE Final  . Sed Rate 02/29/2016 38* 0 - 30 mm/hr Final  . Total CK 02/29/2016 28  7 - 177 U/L Final  . TSH 02/28/2016 0.75  mIU/L Final   Comment:   Reference Range   > or = 20 Years  0.40-4.50   Pregnancy Range First trimester  0.26-2.66 Second trimester 0.55-2.73 Third trimester  0.43-2.91     . Cyclic Citrullin Peptide Ab 03/01/2016 <16  Units Final   Comment:   Reference Range Negative               < 20 Weak Positive            20 - 39 Moderate Positive        40 - 59 Strong Positive        > 59   . SSB (La) (ENA) Antibody, IgG 03/01/2016 <1.0 NEG  <1.0 NEG AI Final  . Lupus Anticoagulant Eval 03/02/2016 REPORT   Final   Comment: A Lupus Anticoagulant is not detected. Reference Range:  Not Detected http://education.questdiagnostics.com/faq/LupusAnticoag ------------------------------------------------------- This interpretation is based on the following test results.   . Ribonucleic Protein(ENA) Antibody,* 03/01/2016 >8.0 POS* <1.0 NEG AI Final  . SSA (Ro) (ENA) Antibody, IgG 03/01/2016 5.7 POS* <1.0 NEG AI Final  . Scleroderma (Scl-70) (ENA) Antibod* 03/01/2016 <1.0 NEG  <1.0 NEG AI Final  . ds DNA Ab 03/01/2016 14* IU/mL Final   Comment:                                 IU/mL       Interpretation                               < or = 4    Negative  5-9         Indeterminate                               > or = 10   Positive     . C3 Complement 03/01/2016 40* 90 - 180 mg/dL Final  . C4 Complement 03/01/2016 8* 16 - 47 mg/dL Final  . G-6PDH 03/01/2016 8.0  4.6 - 13.5 U/g Hgb Final  . Total Protein, Serum Electrophores* 03/04/2016 7.5  6.1 - 8.1 g/dL Final  . Albumin ELP 03/04/2016 3.8   3.8 - 4.8 g/dL Final  . Alpha-1-Globulin 03/04/2016 0.3  0.2 - 0.3 g/dL Final  . Alpha-2-Globulin 03/04/2016 0.8  0.5 - 0.9 g/dL Final  . Beta Globulin 03/04/2016 0.4  0.4 - 0.6 g/dL Final  . Beta 2 03/04/2016 0.4  0.2 - 0.5 g/dL Final  . Gamma Globulin 03/04/2016 1.8* 0.8 - 1.7 g/dL Final  . Abnormal Protein Band1 03/04/2016 NOT DET  g/dL Final  . SPE Interp. 03/04/2016 SEE NOTE   Final   Comment: The increase in the gamma globulins appears to be polyclonal, suggesting a chronic inflammatory response. Reviewed by Odis Hollingshead, MD, PhD, FCAP (Electronic Signature on File)   . Abnormal Protein Band2 03/04/2016 NOT DET  g/dL Final  . Abnormal Protein Band3 03/04/2016 NOT DET  g/dL Final  . IgG (Immunoglobin G), Serum 03/01/2016 2174* 694 - 1,618 mg/dL Final  . IgA 03/01/2016 383  81 - 463 mg/dL Final  . IgM, Serum 03/01/2016 263  48 - 271 mg/dL Final  . Hepatitis B Surface Ag 02/29/2016 NEGATIVE  NEGATIVE Final  . HCV Ab 02/29/2016 NEGATIVE  NEGATIVE Final  . Hep B C IgM 02/29/2016 NON REACTIVE  NON REACTIVE Final   Comment: High levels of Hepatitis B Core IgM antibody are detectable during the acute stage of Hepatitis B. This antibody is used to differentiate current from past HBV infection.     . Hep A IgM 02/29/2016 NON REACTIVE  NON REACTIVE Final   Comment:   Effective February 21, 2014, Hepatitis Acute Panel (test code [id number]) will be revised to automatically reflex to the Hepatitis C Viral RNA, Quantitative, Real-Time PCR assay if the Hepatitis C antibody screening result is Reactive. This action is being taken to ensure that the CDC/USPSTF recommended HCV diagnostic algorithm with the appropriate test reflex needed for accurate interpretation is followed.     Marland Kitchen dRVVT Mix Interp. 03/02/2016 REPORT   Final  . dRVVT Screen 03/02/2016 31  <=45 sec Final  . dRVVT 03/02/2016 REPORT   Final  . PTT-LA Screen 03/02/2016 24  <=40 sec Final  . Additional Testing 03/02/2016  REPORT   Final    Imaging: No results found.  Speciality Comments: No specialty comments available.    Procedures:  No procedures performed Allergies: Lyrica [pregabalin]   Assessment / Plan:     Visit Diagnoses: Autoimmune disease ([place]) - ANA> 1:1280NS,ds14, positive RNP, positive Ro, low C3 and C4, elevated ESR 115,Arthritis, Raynauds. She is doing better on prednisone 10 mg a day. She has minimal arthralgias now and no joint swelling. We had detailed discussion regarding the autoimmune disease. 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 I would like to initiate Plaquenil  and taper off the prednisone over time. Detailed counseling regarding autoimmune disease was provided. Different treatment options and their side effects were discussed at length. Indications side effects contraindications of Plaquenil  were discussed with patient with me and Dr. Koleen Nimrod both. She was in agreement and will proceed with Plaquenil. Handout was given consent was taken. Based on her height Given her Plaquenil 200 mg by mouth daily. She was also given a Plaquenil eye exam form so she can get eye exams through her ophthalmologist to monitor for ocular toxicity. I will check labs in a month and then every 3 months to monitor for drug toxicity.  She also had bilateral crackles in her chest on examination. She gives history of chronic cough and sputum production for a year. She denies any shortness of breath. I will obtain a chest x-ray PA and lateral and high-resolution CT as she has mixed connective tissue disease. I will also refer her to pulmonary.  She has positive Ro antibody which could be associated with arrhythmias. I've given her a prescription to get EKG done with her PCP to rule out arrhythmias.  Chronic pain of both shoulders: Improved on prednisone. Prednisone taper was discussed she'll stay on 10 mg of prednisone for one month and then will taper by 2.5 mg every 2 weeks. If she has any  flare she supposed to notify us.  Bilateral foot pain: Some discomfort  Trigger index finger of right hand - And third: The pain is tolerable currently  High risk medication use - Plan: CBC with Differential/Platelet, COMPLETE METABOLIC PANEL WITH GFR, Thiopurine methyltransferase(tpmt)rbc      Imaging: ImagingResults  No results found.    Speciality Comments: No specialty comments available.    Procedures:  No procedures performed Allergies: Lyrica [pregabalin]        Orders: Orders Placed This Encounter  Procedures  . CBC with Differential/Platelet  . COMPLETE METABOLIC PANEL WITH GFR  . Thiopurine methyltransferase(tpmt)rbc   Meds ordered this encounter  Medications  . hydroxychloroquine (PLAQUENIL) 200 MG tablet    Sig: Take 1 tablet (200 mg total) by mouth daily.    Dispense:  90 tablet    Refill:  0  . predniSONE (DELTASONE) 5 MG tablet    Sig: Take 10 mg daily for 1 month then take 7.5 mg daily for two weeks  then 5 mg daily for two weeks then 2.5 mg daily for two weeks    Dispense:  102 tablet    Refill:  0    Face-to-face time spent with patient was 45 minutes. 50% of time was spent in counseling and coordination of care.  Follow-Up Instructions: Return in about 2 months (around 05/28/2016) for Autoimmune disease.   Bo Merino, MD

## 2016-03-27 ENCOUNTER — Ambulatory Visit (INDEPENDENT_AMBULATORY_CARE_PROVIDER_SITE_OTHER): Payer: Federal, State, Local not specified - PPO | Admitting: Rheumatology

## 2016-03-27 ENCOUNTER — Ambulatory Visit (HOSPITAL_COMMUNITY)
Admission: RE | Admit: 2016-03-27 | Discharge: 2016-03-27 | Disposition: A | Discharge status: Home / Self Care | Payer: Federal, State, Local not specified - PPO | Source: Ambulatory Visit | Attending: Rheumatology | Admitting: Rheumatology

## 2016-03-27 ENCOUNTER — Encounter: Payer: Self-pay | Admitting: Rheumatology

## 2016-03-27 ENCOUNTER — Ambulatory Visit: Payer: Federal, State, Local not specified - PPO | Admitting: Rheumatology

## 2016-03-27 VITALS — BP 134/84 | HR 82 | Resp 16 | Ht 59.0 in | Wt 139.0 lb

## 2016-03-27 DIAGNOSIS — Z79899 Other long term (current) drug therapy: Secondary | ICD-10-CM | POA: Diagnosis not present

## 2016-03-27 DIAGNOSIS — M79671 Pain in right foot: Secondary | ICD-10-CM | POA: Diagnosis not present

## 2016-03-27 DIAGNOSIS — M351 Other overlap syndromes: Secondary | ICD-10-CM

## 2016-03-27 DIAGNOSIS — R0989 Other specified symptoms and signs involving the circulatory and respiratory systems: Secondary | ICD-10-CM | POA: Diagnosis present

## 2016-03-27 DIAGNOSIS — M79672 Pain in left foot: Secondary | ICD-10-CM | POA: Diagnosis not present

## 2016-03-27 DIAGNOSIS — Z111 Encounter for screening for respiratory tuberculosis: Secondary | ICD-10-CM | POA: Diagnosis not present

## 2016-03-27 DIAGNOSIS — M359 Systemic involvement of connective tissue, unspecified: Secondary | ICD-10-CM

## 2016-03-27 DIAGNOSIS — M25511 Pain in right shoulder: Secondary | ICD-10-CM | POA: Diagnosis not present

## 2016-03-27 DIAGNOSIS — R918 Other nonspecific abnormal finding of lung field: Secondary | ICD-10-CM | POA: Insufficient documentation

## 2016-03-27 DIAGNOSIS — M25512 Pain in left shoulder: Secondary | ICD-10-CM | POA: Diagnosis not present

## 2016-03-27 DIAGNOSIS — M65321 Trigger finger, right index finger: Secondary | ICD-10-CM | POA: Diagnosis not present

## 2016-03-27 DIAGNOSIS — G8929 Other chronic pain: Secondary | ICD-10-CM | POA: Diagnosis not present

## 2016-03-27 MED ORDER — PREDNISONE 5 MG PO TABS: ORAL_TABLET | ORAL | 0 refills | Status: DC

## 2016-03-27 MED ORDER — HYDROXYCHLOROQUINE SULFATE 200 MG PO TABS: 200.0000 mg | ORAL_TABLET | Freq: Every day | ORAL | 0 refills | Status: DC

## 2016-03-27 NOTE — Patient Instructions (Addendum)
Standing Labs We placed an order today for your standing lab work.    Please come back and get your standing labs in 1 month  We have open lab Monday through Friday from 8:30-11:30 AM and 1:30-4 PM at the office of Dr. Arbutus PedShaili Katee Wentland/Naitik Panwala, PA.   The office is located at [address], [address], [place], [place] [address] No appointment is necessary.   Labs are drawn by [facility].  You may receive a bill from [facility] for your lab work.   [facility], you will get a call about the CT scan and the Pulmonology appointment,    Your prednisone taper using 5 mg tablets You will use 2 tablets for one month, then one and a half for 2 weeks, then one tablet for 2 weeks, then one half tablet for 2 weeks. Then you will stop.  Hydroxychloroquine tablets What is this medicine? HYDROXYCHLOROQUINE (hye drox ee KLOR oh kwin) is used to treat rheumatoid arthritis and systemic lupus erythematosus. It is also used to treat malaria. This medicine may be used for other purposes; ask your health care provider or pharmacist if you have questions. COMMON BRAND NAME(S): Plaquenil, Quineprox What should I tell my health care provider before I take this medicine? They need to know if you have any of these conditions: -diabetes -eye disease, vision problems -G6PD deficiency -history of blood diseases -history of irregular heartbeat -if you often drink alcohol -kidney disease -liver disease -porphyria -psoriasis -seizures -an unusual or allergic reaction to chloroquine, hydroxychloroquine, other medicines, foods, dyes, or preservatives -pregnant or trying to get pregnant -breast-feeding How should I use this medicine? Take this medicine by mouth with a glass of water. Follow the directions on the prescription label. Avoid taking antacids within 4 hours of taking this medicine. It is best to separate these medicines by at least 4 hours. Do not cut, crush or chew this  medicine. You can take it with or without food. If it upsets your stomach, take it with food. Take your medicine at regular intervals. Do not take your medicine more often than directed. Take all of your medicine as directed even if you think you are better. Do not skip doses or stop your medicine early. Talk to your pediatrician regarding the use of this medicine in children. While this drug may be prescribed for selected conditions, precautions do apply. Overdosage: If you think you have taken too much of this medicine contact a poison control center or emergency room at once. NOTE: This medicine is only for you. Do not share this medicine with others. What if I miss a dose? If you miss a dose, take it as soon as you can. If it is almost time for your next dose, take only that dose. Do not take double or extra doses. What may interact with this medicine? Do not take this medicine with any of the following medications: -cisapride -dofetilide -dronedarone -live virus vaccines -penicillamine -pimozide -thioridazine -ziprasidone This medicine may also interact with the following medications: -ampicillin -antacids -cimetidine -cyclosporine -digoxin -medicines for diabetes, like insulin, glipizide, glyburide -medicines for seizures like carbamazepine, phenobarbital, phenytoin -mefloquine -methotrexate -other medicines that prolong the QT interval (cause an abnormal heart rhythm) -praziquantel This list may not describe all possible interactions. Give your health care provider a list of all the medicines, herbs, non-prescription drugs, or dietary supplements you use. Also tell them if you smoke, drink alcohol, or use illegal drugs. Some items may interact  with your medicine. What should I watch for while using this medicine? Tell your doctor or healthcare professional if your symptoms do not start to get better or if they get worse. Avoid taking antacids within 4 hours of taking this  medicine. It is best to separate these medicines by at least 4 hours. Tell your doctor or health care professional right away if you have any change in your eyesight. Your vision and blood may be tested before and during use of this medicine. This medicine can make you more sensitive to the sun. Keep out of the sun. If you cannot avoid being in the sun, wear protective clothing and use sunscreen. Do not use sun lamps or tanning beds/booths. What side effects may I notice from receiving this medicine? Side effects that you should report to your doctor or health care professional as soon as possible: -allergic reactions like skin rash, itching or hives, swelling of the face, lips, or tongue -changes in vision -decreased hearing or ringing of the ears -redness, blistering, peeling or loosening of the skin, including inside the mouth -seizures -sensitivity to light -signs and symptoms of a dangerous change in heartbeat or heart rhythm like chest pain; dizziness; fast or irregular heartbeat; palpitations; feeling faint or lightheaded, falls; breathing problems -signs and symptoms of liver injury like dark yellow or brown urine; general ill feeling or flu-like symptoms; light-colored stools; loss of appetite; nausea; right upper belly pain; unusually weak or tired; yellowing of the eyes or skin -signs and symptoms of low blood sugar such as feeling anxious; confusion; dizziness; increased hunger; unusually weak or tired; sweating; shakiness; cold; irritable; headache; blurred vision; fast heartbeat; loss of consciousness -uncontrollable head, mouth, neck, arm, or leg movements Side effects that usually do not require medical attention (report to your doctor or health care professional if they continue or are bothersome): -anxious -diarrhea -dizziness -hair loss -headache -irritable -loss of appetite -nausea, vomiting -stomach pain This list may not describe all possible side effects. Call your  doctor for medical advice about side effects. You may report side effects to FDA at 1-800-FDA-1088. Where should I keep my medicine? Keep out of the reach of children. In children, this medicine can cause overdose with small doses. Store at room temperature between 15 and 30 degrees C (59 and 86 degrees F). Protect from moisture and light. Throw away any unused medicine after the expiration date. NOTE: This sheet is a summary. It may not cover all possible information. If you have questions about this medicine, talk to your doctor, pharmacist, or health care provider.  2017 Elsevier/Gold Standard (2015-11-08 14:16:15)  Please get him EKG through your PCP to rule out any arrhythmias can be associated with autoimmune disease.  Please get eye exam to monitor for Plaquenil usage.  Please keep up her appointment with pulmonologist.  We will be scheduling chest x-ray and CT scan of the chest.

## 2016-03-27 NOTE — Progress Notes (Signed)
Pharmacy Note  Subjective: Patient presents today to the [facility] to see Dr. Corliss Skainseveshwar.  Patient seen by the pharmacist for counseling on hydroxychloroquine.    Objective: CMP Latest Ref Rng & Units 02/28/2016  Glucose 65 - 99 mg/dL 147(W102(H)  BUN 7 - 25 mg/dL 18  Creatinine 2.950.50 - 6.210.99 mg/dL 3.080.75  Sodium 657135 - 846146 mmol/L 140  Potassium 3.5 - 5.3 mmol/L 4.4  Chloride 98 - 110 mmol/L 103  CO2 20 - 31 mmol/L 23  Calcium 8.6 - 10.4 mg/dL 9.3  Total Protein 6.1 - 8.1 g/dL 7.5  Total Bilirubin 0.2 - 1.2 mg/dL 0.5  Alkaline Phos 33 - 130 U/L 83  AST 10 - 35 U/L 16  ALT 6 - 29 U/L 30(H)    CBC    Component Value Date/Time   WBC 7.8 02/28/2016 0959   RBC 4.36 02/28/2016 0959   HGB 12.0 02/28/2016 0959   HCT 38.0 02/28/2016 0959   PLT 282 02/28/2016 0959   MCV 87.2 02/28/2016 0959   MCH 27.5 02/28/2016 0959   MCHC 31.6 (L) 02/28/2016 0959   RDW 15.7 (H) 02/28/2016 0959   LYMPHSABS 624 (L) 02/28/2016 0959   MONOABS 468 02/28/2016 0959   EOSABS 78 02/28/2016 0959   BASOSABS 0 02/28/2016 0959    Assessment/Plan: I counseled patient on hydroxychloroquine over the phone on 03/06/16.  I reviewed the purpose, proper use, and adverse effects of hydroxychloroquine including nausea/diarrhea, skin rash, headaches, and sun sensitivity.  Discussed importance of annual eye exams while on hydroxychloroquine to monitor to ocular toxicity and discussed importance of frequent laboratory monitoring.  Provided patient with eye exam form for baseline ophthalmologic exam and standing lab instructions.  Provided patient with additional educational materials on hydroxychloroquine and answered all questions.  Patient consented to hydroxychloroquine.  Will upload consent in the media tab.     Lilla Shookachel Anala Whisenant, Pharm.D., BCPS Clinical Pharmacist Pager: [phone] Phone: [phone] 03/27/2016 9:40 AM

## 2016-04-09 ENCOUNTER — Telehealth (INDEPENDENT_AMBULATORY_CARE_PROVIDER_SITE_OTHER): Payer: Self-pay | Admitting: Rheumatology

## 2016-04-09 NOTE — Telephone Encounter (Signed)
Patient calling about the Plaquenil.  Would like to know WHEN she takes Rx.....AM/PM.  She would like to take it at night.  She's worried that it makes her "want to crawl back in bed"   Please advise ASAP.

## 2016-04-09 NOTE — Telephone Encounter (Signed)
I have called her to advise her to use the PLQ at bedtime.

## 2016-05-22 ENCOUNTER — Encounter: Payer: Self-pay | Admitting: Internal Medicine

## 2016-05-22 ENCOUNTER — Ambulatory Visit (INDEPENDENT_AMBULATORY_CARE_PROVIDER_SITE_OTHER): Payer: Federal, State, Local not specified - PPO | Admitting: Internal Medicine

## 2016-05-22 VITALS — BP 124/78 | HR 91 | Ht 59.0 in | Wt 142.0 lb

## 2016-05-22 DIAGNOSIS — M351 Other overlap syndromes: Secondary | ICD-10-CM | POA: Diagnosis not present

## 2016-05-22 DIAGNOSIS — R059 Cough, unspecified: Secondary | ICD-10-CM

## 2016-05-22 DIAGNOSIS — R0989 Other specified symptoms and signs involving the circulatory and respiratory systems: Secondary | ICD-10-CM | POA: Diagnosis not present

## 2016-05-22 DIAGNOSIS — R05 Cough: Secondary | ICD-10-CM

## 2016-05-22 DIAGNOSIS — R0600 Dyspnea, unspecified: Secondary | ICD-10-CM | POA: Diagnosis not present

## 2016-05-22 DIAGNOSIS — R0689 Other abnormalities of breathing: Secondary | ICD-10-CM

## 2016-05-22 NOTE — Patient Instructions (Addendum)
ICD-9-CM ICD-10-CM   1. 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 Cough 786.2 R05   3. Dyspnea and respiratory abnormality 786.09 R06.00     R06.89   4. Bibasilar crackles 786.7 R09.89    Concern that your autoimmune disease is causing lung inflammation +/- pulmonary hypertension  Plan Do next few weeks - Pre-bd spiro and dlco only. No lung volume or bd response. No post-bd spiro Do next few weeks - HRCT Do next few weeks -echo eval for pulmonary hypertension  Followup  next few weeks but after completing above

## 2016-05-22 NOTE — Addendum Note (Signed)
Addended by: Sheran LuzEAST, Joslin Doell K on: 05/22/2016 12:23 PM   Modules accepted: Orders

## 2016-05-22 NOTE — Progress Notes (Signed)
Subjective:    Patient ID: Madeline Harper, female    DOB: May 16, 1950, 66 y.o.   MRN: [id number]  IOV 05/22/2016    HPI  Chief Complaint  Patient presents with  . Pulmonary Consult    Pt referred by Dr. Corliss Skains for abnormal lung sounds. Pt c/o DOE, prod cough with yellow mucus in morning - resolves throughout the day. Pt denies CP/tightness and f/c/s.      Notes from rheumatologist Dr. D reviewed and summarized Patient has a constellation of ANA greater than 1:1280, positive double-stranded DNA, positive RNP, positive Ro, low C3 and C4 associated with elevated sedimentation rate of 115, arthritis and raynaud. 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 During visit on 03/27/2016 based on my summarization of the chart crackles was observed in the flowsheet been referred here.  Madeline Harper presents for evaluation today. She tells me that for the last 1 year she's had arthralgia and Raynaud symptoms this then resulted in the mixed connective tissue diagnosis as stated above. She also tells me for the last 1 year after having a cold she's had chronic cough associated with sputum production. It is particularly worse in the daytime but she does cough during the rest of the day. She also tells me that she has mild dyspnea and exertion for climbing stairs. She tells me that she works out regularly at [employer] but she does not feel dyspneic although she demonstrated that she has labored breathing. This all improved with rest. 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 She is also wondering if the doctors of performing unnecessary tests on her.   Results for MYKENNA, VIELE (MRN [id number]) as of 05/22/2016 12:00  Ref. Range 02/28/2016 09:59  Creatinine Latest Ref Range: 0.50 - 0.99 mg/dL 7.82    She had a chest x-ray 03/27/2016: Shows coarse interstitial markings suggestive of  interstitial lung disease  Results for BECKI, MCCASKILL (MRN [id number]) as of 05/22/2016 12:00  Ref. Range 02/28/2016 09:59  Cyclic Citrullin Peptide Ab Latest Units: Units <16  ds DNA Ab Latest Units: IU/mL 14 (H)  C3 Complement Latest Ref Range: 90 - 180 mg/dL 40 (L)  C4 Complement Latest Ref Range: 16 - 47 mg/dL 8 (L)  IgG (Immunoglobin G), Serum Latest Ref Range: 694 - 1,618 mg/dL 5,784 (H)  IgA Latest Ref Range: 81 - 463 mg/dL 696  EXBMW-4-[id number] Latest Ref Range: 0.2 - 0.3 g/dL 0.3  UVOZD-6-[id number] Latest Ref Range: 0.5 - 0.9 g/dL 0.8  Ribonucleic Protein(ENA) Antibody, IgG Latest Ref Range: <1.0 NEG AI  >8.0 POS (H)  SSA (Ro) (ENA) Antibody, IgG Latest Ref Range: <1.0 NEG AI  5.7 POS (H)  SSB (La) (ENA) Antibody, IgG Latest Ref Range: <1.0 NEG AI  <1.0 NEG  Scleroderma (Scl-70) (ENA) Antibody, IgG Latest Ref Range: <1.0 NEG AI  <1.0 NEG      has a past medical history of Bone spur of foot.   reports that she quit smoking about 36 years ago. Her smoking use included Cigarettes. She has a 30.00 pack-year smoking history. She has never used smokeless tobacco.  Past Surgical History:  Procedure Laterality Date  . KIDNEY STONE SURGERY  1972  . TUBAL LIGATION  1970    Allergies  Allergen Reactions  . Lyrica [Pregabalin] Swelling    Pedal edema      There is no immunization history on file for this  patient.  Family History  Problem Relation Age of Onset  . Diabetes Mother   . Heart disease Mother   . Kidney failure Mother   . Cancer Mother     Uterine Cancer  . Diabetes Maternal Grandmother   . Kidney failure Maternal Grandmother   . Stroke Sister   . Gout Maternal Grandfather      Current Outpatient Prescriptions:  .  acetaminophen (TYLENOL 8 HOUR ARTHRITIS PAIN) 650 MG CR tablet, Take 650 mg by mouth every 8 (eight) hours as needed for pain., Disp: , Rfl:  .  calcium carbonate (CALCIUM 600) 600 MG TABS tablet, Take 600 mg by mouth daily., Disp: , Rfl:  .   estradiol (ESTRACE VAGINAL) 0.1 MG/GM vaginal cream, as needed. , Disp: , Rfl:  .  hydroxychloroquine (PLAQUENIL) 200 MG tablet, Take 1 tablet (200 mg total) by mouth daily., Disp: 90 tablet, Rfl: 0 .  Multiple Vitamins-Minerals (MULTI ADULT GUMMIES PO), Take 1 tablet by mouth daily., Disp: , Rfl:  .  naproxen sodium (ANAPROX) 220 MG tablet, Take 220 mg by mouth as needed., Disp: , Rfl:  .  predniSONE (DELTASONE) 5 MG tablet, Take 10 mg daily for 1 month then take 7.5 mg daily for two weeks  then 5 mg daily for two weeks then 2.5 mg daily for two weeks, Disp: 102 tablet, Rfl: 0 .  tiZANidine (ZANAFLEX) 4 MG tablet, every 6 (six) hours as needed. , Disp: , Rfl:    Review of Systems  Constitutional: Negative for fever and unexpected weight change.  HENT: Positive for congestion. Negative for dental problem, ear pain, nosebleeds, postnasal drip, rhinorrhea, sinus pressure, sneezing, sore throat and trouble swallowing.   Eyes: Negative for redness and itching.  Respiratory: Positive for cough. Negative for chest tightness, shortness of breath and wheezing.   Cardiovascular: Negative for palpitations and leg swelling.  Gastrointestinal: Negative for nausea and vomiting.  Genitourinary: Negative for dysuria.  Musculoskeletal: Negative for joint swelling.  Skin: Negative for rash.  Neurological: Negative for headaches.  Hematological: Does not bruise/bleed easily.  Psychiatric/Behavioral: Negative for dysphoric mood. The patient is not nervous/anxious.        Objective:   Physical Exam  Constitutional: She is oriented to person, place, and time. She appears well-developed and well-nourished. No distress.  HENT:  Head: Normocephalic and atraumatic.  Right Ear: External ear normal.  Left Ear: External ear normal.  Mouth/Throat: Oropharynx is clear and moist. No oropharyngeal exudate.  Eyes: Conjunctivae and EOM are normal. Pupils are equal, round, and reactive to light. Right eye exhibits no  discharge. Left eye exhibits no discharge. No scleral icterus.  Neck: Normal range of motion. Neck supple. No JVD present. No tracheal deviation present. No thyromegaly present.  Cardiovascular: Normal rate, regular rhythm, normal heart sounds and intact distal pulses.  Exam reveals no gallop and no friction rub.   No murmur heard. Pulmonary/Chest: Effort normal. No respiratory distress. She has no wheezes. She has rales. She exhibits no tenderness.  Extensive crackles  Abdominal: Soft. Bowel sounds are normal. She exhibits no distension and no mass. There is no tenderness. There is no rebound and no guarding.  Musculoskeletal: Normal range of motion. She exhibits no edema or tenderness.  Lymphadenopathy:    She has no cervical adenopathy.  Neurological: She is alert and oriented to person, place, and time. She has normal reflexes. No cranial nerve deficit. She exhibits normal muscle tone. Coordination normal.  Skin: Skin is warm and dry. No  rash noted. She is not diaphoretic. No erythema. No pallor.  Psychiatric: She has a normal mood and affect. Her behavior is normal. Judgment and thought content normal.  Vitals reviewed.   Vitals:   05/22/16 1154  BP: 124/78  Pulse: 91  SpO2: 100%  Weight: 142 lb (64.4 kg)  Height: 4\' 11"  (1.499 m)   Estimated body mass index is 28.68 kg/m as calculated from the following:   Height as of this encounter: 4\' 11"  (1.499 m).   Weight as of this encounter: 142 lb (64.4 kg).       Assessment & Plan:     ICD-9-CM ICD-10-CM   1. 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 Cough 786.2 R05   3. Dyspnea and respiratory abnormality 786.09 R06.00 Pulmonary Function Test    R06.89 CT CHEST HIGH RESOLUTION     ECHOCARDIOGRAM COMPLETE  4. 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 No lung volume or bd response. No post-bd spiro Do next few weeks - HRCT Do next few weeks -echo eval for pulmonary hypertension  Followup  next few weeks but after completing above    Dr. Kalman ShanMurali Jacon Whetzel, M.D., [facility] Hospital[facility]C.C.P Pulmonary and Critical Care Medicine Staff Physician [facility] Pager: [phone], If no answer or between  15:00h - 7:00h: call [phone]  05/22/2016 12:21 PM

## 2016-05-28 ENCOUNTER — Ambulatory Visit (HOSPITAL_COMMUNITY)
Admission: RE | Admit: 2016-05-28 | Discharge: 2016-05-28 | Disposition: A | Discharge status: Home / Self Care | Payer: Federal, State, Local not specified - PPO | Source: Ambulatory Visit | Attending: Rheumatology | Admitting: Rheumatology

## 2016-05-28 DIAGNOSIS — M351 Other overlap syndromes: Secondary | ICD-10-CM | POA: Insufficient documentation

## 2016-05-28 DIAGNOSIS — R0989 Other specified symptoms and signs involving the circulatory and respiratory systems: Secondary | ICD-10-CM

## 2016-05-28 DIAGNOSIS — I700 Atherosclerosis of aorta: Secondary | ICD-10-CM | POA: Insufficient documentation

## 2016-05-28 DIAGNOSIS — J849 Interstitial pulmonary disease, unspecified: Secondary | ICD-10-CM | POA: Diagnosis not present

## 2016-05-28 DIAGNOSIS — Z79899 Other long term (current) drug therapy: Secondary | ICD-10-CM | POA: Insufficient documentation

## 2016-05-28 DIAGNOSIS — I7300 Raynaud's syndrome without gangrene: Secondary | ICD-10-CM | POA: Insufficient documentation

## 2016-05-28 NOTE — Progress Notes (Signed)
Office Visit Note  Patient: Madeline Harper             Date of Birth: [birth year]           MRN: [id number]             PCP: Nilda Simmer, MD Referring: Delilah Shan, MD Visit Date: 06/05/2016 Occupation: @GUAROCC @    Subjective:  Pain in hands    History of Present Illness: Madeline Harper is a 66 y.o. female with history of mixed connective tissue disease and interstitial lung disease. She was trying to taper her prednisone and when she came down to 5 mg by mouth daily she had a flare with increased joint swelling. She increased her prednisone to 10 mg by mouth daily. She's also seen Dr. Chase Caller since the last visit and is getting further workup regarding her pulmonary disease. She continues to have fatigue. Raynauds persist.  Activities of Daily Living:  Patient reports morning stiffness for 10 minutes.   Patient Denies nocturnal pain.  Difficulty dressing/grooming: Denies Difficulty climbing stairs: Denies Difficulty getting out of chair: Denies Difficulty using hands for taps, buttons, cutlery, and/or writing: Reports   Review of Systems  Constitutional: Positive for fatigue. Negative for night sweats, weight gain, weight loss and weakness.  HENT: Negative for mouth sores, trouble swallowing, trouble swallowing, mouth dryness and nose dryness.   Eyes: Negative for pain, redness, visual disturbance and dryness.  Respiratory: Positive for cough. Negative for difficulty breathing.   Cardiovascular: Negative for chest pain, palpitations, hypertension, irregular heartbeat and swelling in legs/feet.  Gastrointestinal: Negative for blood in stool, constipation and diarrhea.  Endocrine: Negative for increased urination.  Genitourinary: Negative for vaginal dryness.  Musculoskeletal: Positive for arthralgias, joint pain, joint swelling and morning stiffness. Negative for myalgias, muscle weakness, muscle tenderness and myalgias.  Skin: Positive for color change. Negative for rash, hair  loss, skin tightness, ulcers and sensitivity to sunlight.  Allergic/Immunologic: Negative for susceptible to infections.  Neurological: Negative for dizziness, memory loss and night sweats.  Hematological: Negative for swollen glands.  Psychiatric/Behavioral: Negative for depressed mood and sleep disturbance. The patient is not nervous/anxious.     PMFS History:  Patient Active Problem List   Diagnosis Date Noted  . Interstitial lung disease ([place]) 06/04/2016  . High risk medication use 05/28/2016  . Raynaud's disease without gangrene 05/28/2016  . Autoimmune disease ([place]) 03/27/2016  . Bilateral foot pain 03/27/2016  . Trigger index finger of right hand 03/27/2016  . Elevated sedimentation rate 02/28/2016  . Positive ANA (antinuclear antibody) 02/27/2016  . Bilateral shoulder pain 02/27/2016  . Status post left foot surgery 06/28/2015  . Tailor's bunion of left foot 06/28/2015  . Bone spur of foot 06/14/2015    Past Medical History:  Diagnosis Date  . Bone spur of foot     Family History  Problem Relation Age of Onset  . Diabetes Mother   . Heart disease Mother   . Kidney failure Mother   . Cancer Mother     Uterine Cancer  . Diabetes Maternal Grandmother   . Kidney failure Maternal Grandmother   . Stroke Sister   . Gout Maternal Grandfather    Past Surgical History:  Procedure Laterality Date  . [place]  . TUBAL LIGATION  1970   Social History   Social History Narrative   Lives with husband. Pt works in [employer]     Objective: Vital Signs: BP 120/80   Pulse 92  Resp 16   Wt 142 lb (64.4 kg)   BMI 28.68 kg/m    Physical Exam  Constitutional: She is oriented to person, place, and time. She appears well-developed and well-nourished.  HENT:  Head: Normocephalic and atraumatic.  Eyes: Conjunctivae and EOM are normal.  Neck: Normal range of motion.  Cardiovascular: Normal rate, regular rhythm, normal heart sounds and intact distal pulses.     Pulmonary/Chest: Effort normal. She has rales.  Bilateral lung bases  Abdominal: Soft. Bowel sounds are normal.  Lymphadenopathy:    She has no cervical adenopathy.  Neurological: She is alert and oriented to person, place, and time.  Skin: Skin is warm and dry. Capillary refill takes less than 2 seconds.  Psychiatric: She has a normal mood and affect. Her behavior is normal.  Nursing note and vitals reviewed.    Musculoskeletal Exam: C-spine and thoracic lumbar spine good range of motion. Shoulder joints elbow joints wrist joint MCPs PIPs DIPs with good range of motion with no synovitis. Hip joints knee joints ankles MTPs PIPs DIPs with good range of motion with no synovitis. No muscular weakness was noted.  CDAI Exam: No CDAI exam completed.    Investigation: No additional findings.  No visits with results within 3 Month(s) from this visit.  Latest known visit with results is:  Office Visit on 02/28/2016  Component Date Value Ref Range Status  . WBC 02/28/2016 7.8  3.8 - 10.8 K/uL Final  . RBC 02/28/2016 4.36  3.80 - 5.10 MIL/uL Final  . Hemoglobin 02/28/2016 12.0  11.7 - 15.5 g/dL Final  . HCT 02/28/2016 38.0  35.0 - 45.0 % Final  . MCV 02/28/2016 87.2  80.0 - 100.0 fL Final  . MCH 02/28/2016 27.5  27.0 - 33.0 pg Final  . MCHC 02/28/2016 31.6* 32.0 - 36.0 g/dL Final  . RDW 02/28/2016 15.7* 11.0 - 15.0 % Final  . Platelets 02/28/2016 282  140 - 400 K/uL Final  . MPV 02/28/2016 10.7  7.5 - 12.5 fL Final  . Neutro Abs 02/28/2016 6630  1,500 - 7,800 cells/uL Final  . Lymphs Abs 02/28/2016 624* 850 - 3,900 cells/uL Final  . Monocytes Absolute 02/28/2016 468  200 - 950 cells/uL Final  . Eosinophils Absolute 02/28/2016 78  15 - 500 cells/uL Final  . Basophils Absolute 02/28/2016 0  0 - 200 cells/uL Final  . Neutrophils Relative % 02/28/2016 85  % Final  . Lymphocytes Relative 02/28/2016 8  % Final  . Monocytes Relative 02/28/2016 6  % Final  . Eosinophils Relative 02/28/2016 1   % Final  . Basophils Relative 02/28/2016 0  % Final  . Smear Review 02/28/2016 Criteria for review not met   Final  . Sodium 02/28/2016 140  135 - 146 mmol/L Final  . Potassium 02/28/2016 4.4  3.5 - 5.3 mmol/L Final  . Chloride 02/28/2016 103  98 - 110 mmol/L Final  . CO2 02/28/2016 23  20 - 31 mmol/L Final  . Glucose, Bld 02/28/2016 102* 65 - 99 mg/dL Final  . BUN 02/28/2016 18  7 - 25 mg/dL Final  . Creat 02/28/2016 0.75  0.50 - 0.99 mg/dL Final   Comment:   For patients > or = 65 years of age: The upper reference limit for Creatinine is approximately 13% higher for people identified as African-American.     . Total Bilirubin 02/28/2016 0.5  0.2 - 1.2 mg/dL Final  . Alkaline Phosphatase 02/28/2016 83  33 - 130 U/L Final  .  AST 02/28/2016 16  10 - 35 U/L Final  . ALT 02/28/2016 30* 6 - 29 U/L Final  . Total Protein 02/28/2016 7.5  6.1 - 8.1 g/dL Final  . Albumin 02/28/2016 3.7  3.6 - 5.1 g/dL Final  . Calcium 02/28/2016 9.3  8.6 - 10.4 mg/dL Final  . GFR, Est African American 02/28/2016 >89  >=60 mL/min Final  . GFR, Est Non African American 02/28/2016 85  >=60 mL/min Final  . Color, Urine 02/28/2016 YELLOW  YELLOW Final  . APPearance 02/28/2016 CLEAR  CLEAR Final  . Specific Gravity, Urine 02/28/2016 1.018  1.001 - 1.035 Final  . pH 02/28/2016 7.0  5.0 - 8.0 Final  . Glucose, UA 02/28/2016 NEGATIVE  NEGATIVE Final  . Bilirubin Urine 02/28/2016 NEGATIVE  NEGATIVE Final  . Ketones, ur 02/28/2016 NEGATIVE  NEGATIVE Final  . Hgb urine dipstick 02/28/2016 NEGATIVE  NEGATIVE Final  . Protein, ur 02/28/2016 NEGATIVE  NEGATIVE Final  . Nitrite 02/28/2016 NEGATIVE  NEGATIVE Final  . Leukocytes, UA 02/28/2016 NEGATIVE  NEGATIVE Final  . Sed Rate 02/28/2016 38* 0 - 30 mm/hr Final  . Total CK 02/28/2016 28  7 - 177 U/L Final  . TSH 02/28/2016 0.75  mIU/L Final   Comment:   Reference Range   > or = 20 Years  0.40-4.50   Pregnancy Range First trimester  0.26-2.66 Second trimester  0.55-2.73 Third trimester  0.43-2.91     . Cyclic Citrullin Peptide Ab 02/28/2016 <16  Units Final   Comment:   Reference Range Negative               < 20 Weak Positive            20 - 39 Moderate Positive        40 - 59 Strong Positive        > 59   . SSB ([place]) (ENA) Antibody, IgG 02/28/2016 <1.0 NEG  <1.0 NEG AI Final  . Lupus Anticoagulant Eval 02/28/2016 REPORT   Final   Comment: A Lupus Anticoagulant is not detected. Reference Range:  Not Detected http://education.questdiagnostics.com/faq/LupusAnticoag ------------------------------------------------------- This interpretation is based on the following test results.   . Ribonucleic Protein(ENA) Antibody,* 02/28/2016 >8.0 POS* <1.0 NEG AI Final  . SSA (Ro) (ENA) Antibody, IgG 02/28/2016 5.7 POS* <1.0 NEG AI Final  . Scleroderma (Scl-70) (ENA) Antibod* 02/28/2016 <1.0 NEG  <1.0 NEG AI Final  . ds DNA Ab 02/28/2016 14* IU/mL Final   Comment:                                 IU/mL       Interpretation                               < or = 4    Negative                               5-9         Indeterminate                               > or = 10   Positive     . C3 Complement 02/28/2016 40* 90 - 180 mg/dL Final  . C4 Complement 02/28/2016 8* 16 -  47 mg/dL Final  . G-6PDH 02/28/2016 8.0  4.6 - 13.5 U/g Hgb Final  . Total Protein, Serum Electrophores* 02/28/2016 7.5  6.1 - 8.1 g/dL Final  . Albumin ELP 02/28/2016 3.8  3.8 - 4.8 g/dL Final  . Alpha-1-Globulin 02/28/2016 0.3  0.2 - 0.3 g/dL Final  . Alpha-2-Globulin 02/28/2016 0.8  0.5 - 0.9 g/dL Final  . Beta Globulin 02/28/2016 0.4  0.4 - 0.6 g/dL Final  . Beta 2 02/28/2016 0.4  0.2 - 0.5 g/dL Final  . Gamma Globulin 02/28/2016 1.8* 0.8 - 1.7 g/dL Final  . Abnormal Protein Band1 02/28/2016 NOT DET  g/dL Final  . SPE Interp. 02/28/2016 SEE NOTE   Final   Comment: The increase in the gamma globulins appears to be polyclonal, suggesting a chronic inflammatory  response. Reviewed by Odis Hollingshead, MD, PhD, FCAP (Electronic Signature on File)   . Abnormal Protein Band2 02/28/2016 NOT DET  g/dL Final  . Abnormal Protein Band3 02/28/2016 NOT DET  g/dL Final  . IgG (Immunoglobin G), Serum 02/28/2016 2174* 694 - 1,618 mg/dL Final  . IgA 02/28/2016 383  81 - 463 mg/dL Final  . IgM, Serum 02/28/2016 263  48 - 271 mg/dL Final  . Hepatitis B Surface Ag 02/28/2016 NEGATIVE  NEGATIVE Final  . HCV Ab 02/28/2016 NEGATIVE  NEGATIVE Final  . Hep B C IgM 02/28/2016 NON REACTIVE  NON REACTIVE Final   Comment: High levels of Hepatitis B Core IgM antibody are detectable during the acute stage of Hepatitis B. This antibody is used to differentiate current from past HBV infection.     . Hep A IgM 02/28/2016 NON REACTIVE  NON REACTIVE Final   Comment:   Effective February 21, 2014, Hepatitis Acute Panel (test code [phone]) will be revised to automatically reflex to the Hepatitis C Viral RNA, Quantitative, Real-Time PCR assay if the Hepatitis C antibody screening result is Reactive. This action is being taken to ensure that the CDC/USPSTF recommended HCV diagnostic algorithm with the appropriate test reflex needed for accurate interpretation is followed.     Marland Kitchen dRVVT Mix Interp. 02/28/2016 REPORT   Final  . dRVVT Screen 02/28/2016 31  <=45 sec Final  . dRVVT 02/28/2016 REPORT   Final  . PTT-LA Screen 02/28/2016 24  <=40 sec Final  . Additional Testing 02/28/2016 REPORT   Final    Imaging: Ct Chest High Resolution  Result Date: 05/28/2016 CLINICAL DATA:  66 year old female with history of crackles on physical examination for the past 2-3 months. Chronic cough. History of mixed connective tissue disease with arthralgias and Raynaud's disease. EXAM: CT CHEST WITHOUT CONTRAST TECHNIQUE: Multidetector CT imaging of the chest was performed following the standard protocol without intravenous contrast. High resolution imaging of the lungs, as well as inspiratory  and expiratory imaging, was performed. COMPARISON:  No priors. FINDINGS: Cardiovascular: Heart size is normal. There is no significant pericardial fluid, thickening or pericardial calcification. Atherosclerotic calcifications in the thoracic aorta (mild). No definite coronary artery calcifications. Mediastinum/Nodes: No pathologically enlarged mediastinal or hilar lymph nodes. Please note that accurate exclusion of hilar adenopathy is limited on noncontrast CT scans. Esophagus is unremarkable in appearance. There multiple prominent borderline enlarged axillary lymph nodes bilaterally, however, these appear to have normal fatty hila. Lungs/Pleura: Extensive areas of subpleural reticulation, thickening of the peribronchovascular interstitium, parenchymal banding, traction bronchiectasis and honeycombing are noted throughout the lungs bilaterally. These findings have a definitive craniocaudal gradient and are considered diagnostic of usual interstitial pneumonia (UIP). Inspiratory and  expiratory imaging is unremarkable. No acute consolidative airspace disease. No pleural effusions. No suspicious appearing pulmonary nodules or masses. Upper Abdomen: Unremarkable. Musculoskeletal: There are no aggressive appearing lytic or blastic lesions noted in the visualized portions of the skeleton. IMPRESSION: 1. 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 2. Aortic atherosclerosis. Electronically Signed   By: Vinnie Langton M.D.   On: 05/28/2016 14:41    Speciality Comments: No specialty comments available.    Procedures:  No procedures performed Allergies: Lyrica [pregabalin]   Assessment / Plan:     Visit Diagnoses:  Mixed connective tissue disease: She has positive ANA positive double-stranded DNA, RNP, Ro, ESR 115, hypocomplementemia arthritis and Raynauds. She also has interstitial lung  disease. The pulmonary workup is still going on. She continues to have joint pain and swelling intermittently and cough in the morning. She denies any shortness of breath. We had detailed discussion regarding her current situation and interstitial lung disease different treatment options and their side effects were discussed at length. I also made a phone call to Dr. Chase Caller and he agreed with proceeding with Imuran. I had detailed discussion regarding Imuran indications side effects contraindications with patient. Handout was given consent was taken. I will obtain CBC, CMP, TMP T, TB gold today if her tests are normal then we can start Imuran. My plan is to start on Imuran 50 mg by mouth daily for 2 weeks and increase it by 50 mg every 2 weeks until she reaches 150 mg by mouth daily. I'll check labs every 2 weeks 3 then every 2 months to monitor for drug toxicity. Before patient left she stated that she's not very much convinced about going on a DMARD therapy. I will advised to discuss this further with Dr. Chase Caller as well.  . In the meantime she will continue on prednisone 10 mg by mouth daily I'll give her a new prescription today.  Her Raynauds is a still active which causes discomfort.  She complains of pain in her bilateral hands shoulders and feet do not see any synovitis on examination today  High-risk prescription she is currently on Plaquenil. When she starts Imuran and it gets into her system I'll discontinue Plaquenil.     Chronic pain of both shoulders  Bilateral foot pain   Orders: Orders Placed This Encounter  Procedures  . CBC with Differential/Platelet  . COMPLETE METABOLIC PANEL WITH GFR  . CBC with Differential/Platelet  . COMPLETE METABOLIC PANEL WITH GFR   Meds ordered this encounter  Medications  . predniSONE (DELTASONE) 5 MG tablet    Sig: Take 2 tablets (10 mg total) by mouth daily with breakfast.    Dispense:  180 tablet    Refill:  0    Face-to-face  time spent with patient was 40 minutes. 50% of time was spent in counseling and coordination of care.  Follow-Up Instructions: Return in about 2 months (around 08/03/2016) for MCTD, ILD.  Bo Merino, MD  Note - This record has been created using Editor, commissioning.  Chart creation errors have been sought, but may not always  have been located. Such creation errors do not reflect on  the standard of medical care.

## 2016-05-29 ENCOUNTER — Ambulatory Visit (HOSPITAL_BASED_OUTPATIENT_CLINIC_OR_DEPARTMENT_OTHER)
Admission: RE | Admit: 2016-05-29 | Discharge: 2016-05-29 | Disposition: A | Discharge status: Home / Self Care | Payer: Federal, State, Local not specified - PPO | Source: Ambulatory Visit | Attending: Internal Medicine | Admitting: Internal Medicine

## 2016-05-29 DIAGNOSIS — R0689 Other abnormalities of breathing: Secondary | ICD-10-CM

## 2016-05-29 DIAGNOSIS — R0600 Dyspnea, unspecified: Secondary | ICD-10-CM | POA: Diagnosis not present

## 2016-05-29 DIAGNOSIS — M351 Other overlap syndromes: Secondary | ICD-10-CM | POA: Diagnosis not present

## 2016-05-29 DIAGNOSIS — R0989 Other specified symptoms and signs involving the circulatory and respiratory systems: Secondary | ICD-10-CM

## 2016-05-29 NOTE — Progress Notes (Signed)
Echocardiogram 2D Echocardiogram has been performed.  Madeline Harper 05/29/2016, 1:55 PM

## 2016-05-29 NOTE — Progress Notes (Signed)
Notify Pt and refer her to Dr. Marchelle Gearingamaswamy

## 2016-05-31 ENCOUNTER — Telehealth: Payer: Self-pay | Admitting: Radiology

## 2016-05-31 NOTE — Telephone Encounter (Signed)
-----   Message from Pollyann SavoyShaili Deveshwar, MD sent at 05/29/2016  5:47 PM EST ----- Notify Pt and refer her to Dr. Marchelle Gearingamaswamy

## 2016-05-31 NOTE — Telephone Encounter (Signed)
Sent to patient in my chart, called her to advise. She has an appointment with Dr Marchelle Gearingamaswamy in March

## 2016-06-04 DIAGNOSIS — J849 Interstitial pulmonary disease, unspecified: Secondary | ICD-10-CM | POA: Insufficient documentation

## 2016-06-05 ENCOUNTER — Ambulatory Visit (INDEPENDENT_AMBULATORY_CARE_PROVIDER_SITE_OTHER): Payer: Federal, State, Local not specified - PPO | Admitting: Rheumatology

## 2016-06-05 ENCOUNTER — Encounter: Payer: Self-pay | Admitting: Rheumatology

## 2016-06-05 ENCOUNTER — Telehealth: Payer: Self-pay | Admitting: Internal Medicine

## 2016-06-05 VITALS — BP 120/80 | HR 92 | Resp 16 | Wt 142.0 lb

## 2016-06-05 DIAGNOSIS — M25511 Pain in right shoulder: Secondary | ICD-10-CM

## 2016-06-05 DIAGNOSIS — J849 Interstitial pulmonary disease, unspecified: Secondary | ICD-10-CM

## 2016-06-05 DIAGNOSIS — I7300 Raynaud's syndrome without gangrene: Secondary | ICD-10-CM

## 2016-06-05 DIAGNOSIS — G8929 Other chronic pain: Secondary | ICD-10-CM

## 2016-06-05 DIAGNOSIS — Z111 Encounter for screening for respiratory tuberculosis: Secondary | ICD-10-CM

## 2016-06-05 DIAGNOSIS — M79671 Pain in right foot: Secondary | ICD-10-CM

## 2016-06-05 DIAGNOSIS — M359 Systemic involvement of connective tissue, unspecified: Secondary | ICD-10-CM | POA: Diagnosis not present

## 2016-06-05 DIAGNOSIS — M25512 Pain in left shoulder: Secondary | ICD-10-CM

## 2016-06-05 DIAGNOSIS — M79672 Pain in left foot: Secondary | ICD-10-CM

## 2016-06-05 DIAGNOSIS — Z79899 Other long term (current) drug therapy: Secondary | ICD-10-CM

## 2016-06-05 LAB — CBC WITH DIFFERENTIAL/PLATELET
BASOS ABS: 0 {cells}/uL (ref 0–200)
Basophils Relative: 0 %
Eosinophils Absolute: 0 cells/uL — ABNORMAL LOW (ref 15–500)
Eosinophils Relative: 0 %
HCT: 38.8 % (ref 35.0–45.0)
HEMOGLOBIN: 12.2 g/dL (ref 11.7–15.5)
LYMPHS ABS: 732 {cells}/uL — AB (ref 850–3900)
Lymphocytes Relative: 12 %
MCH: 26.5 pg — AB (ref 27.0–33.0)
MCHC: 31.4 g/dL — AB (ref 32.0–36.0)
MCV: 84.3 fL (ref 80.0–100.0)
MONO ABS: 366 {cells}/uL (ref 200–950)
MPV: 10.9 fL (ref 7.5–12.5)
Monocytes Relative: 6 %
NEUTROS PCT: 82 %
Neutro Abs: 5002 cells/uL (ref 1500–7800)
Platelets: 258 10*3/uL (ref 140–400)
RBC: 4.6 MIL/uL (ref 3.80–5.10)
RDW: 14.4 % (ref 11.0–15.0)
WBC: 6.1 10*3/uL (ref 3.8–10.8)

## 2016-06-05 MED ORDER — PREDNISONE 5 MG PO TABS: 10.0000 mg | ORAL_TABLET | Freq: Every day | ORAL | 0 refills | Status: DC

## 2016-06-05 NOTE — Progress Notes (Signed)
Pharmacy Note  Subjective: Patient presents today to the [facility] to see Dr. Corliss Skainseveshwar.  Patient is currently taking hydroxychloroquine 200 mg daily and prednisone 10 mg daily.  She has not had labs since starting hydroxychloroquine in December.  She is due for standing labs today.  Patient has not had hydroxychloroquine eye exam since starting hydroxychloroquine.  Advised patient of importance of hydroxychloroquine eye exam.  Provided her with eye exam form.    Does the patient feel that his/her medications are working for him/her?  No Has the patient been experiencing any side effects to the medications prescribed?  No Does the patient have any problems obtaining medications?  No  Decision was made by Dr. Corliss Skainseveshwar to initiate patient on azathioprine (Imuram).  Patient seen by the pharmacist for counseling on azathioprine (Imuran).    Objective: CMP     Component Value Date/Time   NA 140 02/28/2016 0959   K 4.4 02/28/2016 0959   CL 103 02/28/2016 0959   CO2 23 02/28/2016 0959   GLUCOSE 102 (H) 02/28/2016 0959   BUN 18 02/28/2016 0959   CREATININE 0.75 02/28/2016 0959   CALCIUM 9.3 02/28/2016 0959   PROT 7.5 02/28/2016 0959   ALBUMIN 3.7 02/28/2016 0959   AST 16 02/28/2016 0959   ALT 30 (H) 02/28/2016 0959   ALKPHOS 83 02/28/2016 0959   BILITOT 0.5 02/28/2016 0959   GFRNONAA 85 02/28/2016 0959   GFRAA >89 02/28/2016 0959   CBC    Component Value Date/Time   WBC 7.8 02/28/2016 0959   RBC 4.36 02/28/2016 0959   HGB 12.0 02/28/2016 0959   HCT 38.0 02/28/2016 0959   PLT 282 02/28/2016 0959   MCV 87.2 02/28/2016 0959   MCH 27.5 02/28/2016 0959   MCHC 31.6 (L) 02/28/2016 0959   RDW 15.7 (H) 02/28/2016 0959   LYMPHSABS 624 (L) 02/28/2016 0959   MONOABS 468 02/28/2016 0959   EOSABS 78 02/28/2016 0959   BASOSABS 0 02/28/2016 0959   TPMT: ordered today  Assessment/Plan: Patient was counseled on the purpose, proper use, and adverse effects of azathioprine  including risk of infection, nausea, rash, and hair loss.  Reviewed risk of cancer after long term use.  Discussed risk of myelosupression and reviewed importance of frequent lab work to monitor blood counts.  Provided patient with educational materials on azathioprine and answered all questions.  Patient did not want to consent to Imuran at this time.  She took home information to read about and will decide at a later date.  We will need result of TPMT and Imuran consent before Imuran can be initiated.     Lilla Shookachel Henderson, Pharm.D., BCPS Clinical Pharmacist Pager: [phone] Phone: [phone] 06/05/2016 12:32 PM

## 2016-06-05 NOTE — Telephone Encounter (Signed)
Notes Recorded by Lorel MonacoLindsay C Lemons, CMA on 06/05/2016 at 12:24 PM EST lmtcb x2 for pt. ------  Notes Recorded by Velvet BatheAshley L Caulfield, CMA on 06/04/2016 at 10:39 AM EST lmtcb X1 for pt to relay results/recs.  ------  Notes Recorded by Kalman ShanMurali Ramaswamy, MD on 05/31/2016 at 2:21 PM EST Let Madeline Harper know that echo show very mild stiff heart muscle but the important thing is no evidence of pulmonary hypertension  Pt aware of results. Nothing more needed at this time.

## 2016-06-05 NOTE — Patient Instructions (Addendum)
Standing Labs We placed an order today for your standing lab work.    Please come back and get your standing labs in 2weeks after start of Imuran then every 2weeks x2 then every 2 months.  We have open lab Monday through Friday from 8:30-11:30 AM and 1:30-4 PM at the office of Dr. Arbutus Ped, PA.   The office is located at [address], [address], [place], [place] [address] No appointment is necessary.   Labs are drawn by [facility].  You may receive a bill from [facility] for your lab work.    Continue taking prednisone 5 mg 2 tablets every morning  Azathioprine tablets What is this medicine? AZATHIOPRINE (ay za THYE oh preen) suppresses the immune system. It is used to prevent organ rejection after a transplant. It is also used to treat rheumatoid arthritis. This medicine may be used for other purposes; ask your health care provider or pharmacist if you have questions. COMMON BRAND NAME(S): Azasan, Imuran What should I tell my health care provider before I take this medicine? They need to know if you have any of these conditions: -infection -kidney disease -liver disease -an unusual or allergic reaction to azathioprine, other medicines, lactose, foods, dyes, or preservatives -pregnant or trying to get pregnant -breast feeding How should I use this medicine? Take this medicine by mouth with a full glass of water. Follow the directions on the prescription label. Take your medicine at regular intervals. Do not take your medicine more often than directed. Continue to take your medicine even if you feel better. Do not stop taking except on your doctor's advice. Talk to your pediatrician regarding the use of this medicine in children. Special care may be needed. Overdosage: If you think you have taken too much of this medicine contact a poison control center or emergency room at once. NOTE: This medicine is only for you. Do not share this medicine with others. What if I miss  a dose? If you miss a dose, take it as soon as you can. If it is almost time for your next dose, take only that dose. Do not take double or extra doses. What may interact with this medicine? Do not take this medicine with any of the following medications: -febuxostat -mercaptopurine This medicine may also interact with the following medications: -allopurinol -aminosalicylates like sulfasalazine, mesalamine, balsalazide, and olsalazine -leflunomide -medicines called ACE inhibitors like benazepril, captopril, enalapril, fosinopril, quinapril, lisinopril, ramipril, and trandolapril -mycophenolate -sulfamethoxazole; trimethoprim -vaccines -warfarin This list may not describe all possible interactions. Give your health care provider a list of all the medicines, herbs, non-prescription drugs, or dietary supplements you use. Also tell them if you smoke, drink alcohol, or use illegal drugs. Some items may interact with your medicine. What should I watch for while using this medicine? Visit your doctor or health care professional for regular checks on your progress. You will need frequent blood checks during the first few months you are receiving the medicine. If you get a cold or other infection while receiving this medicine, call your doctor or health care professional. Do not treat yourself. The medicine may increase your risk of getting an infection. Women should inform their doctor if they wish to become pregnant or think they might be pregnant. There is a potential for serious side effects to an unborn child. Talk to your health care professional or pharmacist for more information. Men may have a reduced sperm count while they are taking this medicine. Talk to your health  care professional for more information. This medicine may increase your risk of getting certain kinds of cancer. Talk to your doctor about healthy lifestyle choices, important screenings, and your risk. What side effects may I  notice from receiving this medicine? Side effects that you should report to your doctor or health care professional as soon as possible: -allergic reactions like skin rash, itching or hives, swelling of the face, lips, or tongue -changes in vision -confusion -fever, chills, or any other sign of infection -loss of balance or coordination -severe stomach pain -unusual bleeding, bruising -unusually weak or tired -vomiting -yellowing of the eyes or skin Side effects that usually do not require medical attention (report to your doctor or health care professional if they continue or are bothersome): -hair loss -nausea This list may not describe all possible side effects. Call your doctor for medical advice about side effects. You may report side effects to FDA at 1-800-FDA-1088. Where should I keep my medicine? Keep out of the reach of children. Store at room temperature between 15 and 25 degrees C (59 and 77 degrees F). Protect from light. Throw away any unused medicine after the expiration date. NOTE: This sheet is a summary. It may not cover all possible information. If you have questions about this medicine, talk to your doctor, pharmacist, or health care provider.  2018 Elsevier/Gold Standard (2013-07-20 12:00:31)

## 2016-06-06 LAB — COMPLETE METABOLIC PANEL WITH GFR
ALT: 18 U/L (ref 6–29)
AST: 20 U/L (ref 10–35)
Albumin: 3.6 g/dL (ref 3.6–5.1)
Alkaline Phosphatase: 61 U/L (ref 33–130)
BUN: 16 mg/dL (ref 7–25)
CALCIUM: 9.7 mg/dL (ref 8.6–10.4)
CHLORIDE: 104 mmol/L (ref 98–110)
CO2: 21 mmol/L (ref 20–31)
CREATININE: 0.91 mg/dL (ref 0.50–0.99)
GFR, EST AFRICAN AMERICAN: 77 mL/min (ref >=60)
GFR, EST NON AFRICAN AMERICAN: 67 mL/min (ref >=60)
Glucose, Bld: 109 mg/dL — ABNORMAL HIGH (ref 65–99)
Potassium: 4.3 mmol/L (ref 3.5–5.3)
Sodium: 139 mmol/L (ref 135–146)
Total Bilirubin: 0.3 mg/dL (ref 0.2–1.2)
Total Protein: 7.9 g/dL (ref 6.1–8.1)

## 2016-06-06 LAB — CBC WITH DIFFERENTIAL/PLATELET
BASOS PCT: 0 %
Basophils Absolute: 0 cells/uL (ref 0–200)
EOS ABS: 0 {cells}/uL — AB (ref 15–500)
EOS PCT: 0 %
HCT: 38.7 % (ref 35.0–45.0)
Hemoglobin: 12.2 g/dL (ref 11.7–15.5)
LYMPHS PCT: 13 %
Lymphs Abs: 806 cells/uL — ABNORMAL LOW (ref 850–3900)
MCH: 26.8 pg — ABNORMAL LOW (ref 27.0–33.0)
MCHC: 31.5 g/dL — ABNORMAL LOW (ref 32.0–36.0)
MCV: 85.1 fL (ref 80.0–100.0)
MONOS PCT: 5 %
MPV: 10.9 fL (ref 7.5–12.5)
Monocytes Absolute: 310 cells/uL (ref 200–950)
NEUTROS ABS: 5084 {cells}/uL (ref 1500–7800)
Neutrophils Relative %: 82 %
PLATELETS: 248 10*3/uL (ref 140–400)
RBC: 4.55 MIL/uL (ref 3.80–5.10)
RDW: 14.5 % (ref 11.0–15.0)
WBC: 6.2 10*3/uL (ref 3.8–10.8)

## 2016-06-06 NOTE — Progress Notes (Signed)
WNL

## 2016-06-06 NOTE — Progress Notes (Signed)
Left voicemail for patient to contact office for medical results.

## 2016-06-07 LAB — QUANTIFERON TB GOLD ASSAY (BLOOD)
Mitogen-Nil: 0.11 IU/mL
Quantiferon Nil Value: 0.02 IU/mL

## 2016-06-10 ENCOUNTER — Other Ambulatory Visit: Payer: Self-pay | Admitting: Rheumatology

## 2016-06-11 ENCOUNTER — Telehealth: Payer: Self-pay | Admitting: Rheumatology

## 2016-06-11 NOTE — Telephone Encounter (Signed)
Patient called wanting to know what she can take for the pain when she is working.  She wants to make sure that she is not mixing her medication.  She also states that tylenol does not help the pain.  Please call patient and advise what she can do.  She takes Oxycodone when she is not working.  [phone]#[phone].  Thank you.

## 2016-06-11 NOTE — Telephone Encounter (Signed)
Patient states she is in severe pain. Patient states she was told to take tylenol and she is in pain at work. Patient states she had Oxycodone from a previous doctor and took it. She states it did help with the pain. Patient would like to know if there is anything else she can take during the day while she is working. Patient is currently on PLQ 1 daily and Prednisone 10 mg daily.

## 2016-06-11 NOTE — Telephone Encounter (Signed)
Need results of TB gold and TPM T

## 2016-06-11 NOTE — Telephone Encounter (Signed)
Attempted to contact the patient and unable to leave message. Mailbox is full.  

## 2016-06-11 NOTE — Telephone Encounter (Signed)
Patient had CBC and CMP which was normal on 06/05/16. DO we need any other labs?

## 2016-06-11 NOTE — Telephone Encounter (Signed)
We discussed Imuran during the last visit. That would be the treatment of choice. If she wants to start on the medication and will need lab for an acute consent from the patient. In the meantime she can take Tylenol. I would not be able to prescribe narcotics.

## 2016-06-12 ENCOUNTER — Other Ambulatory Visit: Payer: Self-pay | Admitting: Pharmacist

## 2016-06-12 ENCOUNTER — Other Ambulatory Visit: Payer: Self-pay | Admitting: *Deleted

## 2016-06-12 ENCOUNTER — Telehealth: Payer: Self-pay | Admitting: Pharmacist

## 2016-06-12 ENCOUNTER — Other Ambulatory Visit: Payer: Self-pay | Admitting: Radiology

## 2016-06-12 DIAGNOSIS — Z79899 Other long term (current) drug therapy: Secondary | ICD-10-CM

## 2016-06-12 DIAGNOSIS — Z9225 Personal history of immunosupression therapy: Secondary | ICD-10-CM

## 2016-06-12 DIAGNOSIS — M351 Other overlap syndromes: Secondary | ICD-10-CM

## 2016-06-12 LAB — THIOPURINE METHYLTRANSFERASE (TPMT), RBC: Thiopurine Methyltransferase, RBC: 17 nmol/hr/mL RBC

## 2016-06-12 NOTE — Telephone Encounter (Signed)
TPMT pending....and TB Gold indeterminate. Patient coming to office to have it redrawn. Patient will sign consent on Imuran while here.

## 2016-06-12 NOTE — Telephone Encounter (Signed)
Pharmacy Note  Subjective: Patient came by the office for repeat TB Gold test and to sign consent on Imuran (see telephone note from 06/11/16).  Patient seen by me for counseling on Imuran.    Objective: CMP normal except for glucose of 109 mg/dL (1/61/09602/28/2018) CBC    Component Value Date/Time   WBC 6.2 06/05/2016 1230   RBC 4.55 06/05/2016 1230   HGB 12.2 06/05/2016 1230   HCT 38.7 06/05/2016 1230   PLT 248 06/05/2016 1230   MCV 85.1 06/05/2016 1230   MCH 26.8 (L) 06/05/2016 1230   MCHC 31.5 (L) 06/05/2016 1230   RDW 14.5 06/05/2016 1230   LYMPHSABS 806 (L) 06/05/2016 1230   MONOABS 310 06/05/2016 1230   EOSABS 0 (L) 06/05/2016 1230   BASOSABS 0 06/05/2016 1230   TPMT: normal (06/05/16)  Assessment/Plan: Patient was counseled on the purpose, proper use, and adverse effects of azathioprine including risk of infection, nausea, rash, and hair loss.  Reviewed risk of cancer after long term use.  Discussed risk of myelosupression and reviewed importance of frequent lab work to monitor blood counts.  Reviewed drug-drug interactions including contraindication with allopurinol.  Provided patient with educational materials on azathioprine and answered all questions.  Patient did not consent to Imuran at this time.  She took home information on Imuran and will read more about it.  She also asked about getting second opinion.    Patient is concerned that her pain is not being adequately treated.  Patient asked about a prescription for oxycodone.  Patient was previously informed that Dr. Corliss Skainseveshwar would not be able to prescribe narcotics, and that Dr. Corliss Skainseveshwar recommended taking acetaminophen for pain.  I again explained this to the patient.  Also reviewed that prednisone is being prescribed to help with her inflammation as well.    Lilla Shookachel Jillane Po, Pharm.D., BCPS Clinical Pharmacist Pager: [phone] Phone: [phone] 06/12/2016 2:50 PM

## 2016-06-12 NOTE — Telephone Encounter (Signed)
ok 

## 2016-06-12 NOTE — Progress Notes (Signed)
Repeat TB gold

## 2016-06-14 LAB — QUANTIFERON TB GOLD ASSAY (BLOOD)
MITOGEN-NIL SO: 0.16 [IU]/mL
QUANTIFERON NIL VALUE: 0.02 [IU]/mL
Quantiferon Tb Ag Minus Nil Value: 0 IU/mL

## 2016-06-15 NOTE — Progress Notes (Signed)
She will need PPD prior to starting on any immunosuppressive agents. She is going for second opinion. It'll be up to her when she wants to PPD.

## 2016-06-24 ENCOUNTER — Telehealth: Payer: Self-pay | Admitting: Rheumatology

## 2016-06-24 NOTE — Telephone Encounter (Signed)
Patient advised the other medication offered is Imuran. Patient states she refuses to take that medication. Patient states she wants to know what other treatment options there are. Advised she would have to discuss that with Dr. Corliss Skainseveshwar. Patient has been scheduled for 07/18/16.

## 2016-06-24 NOTE — Telephone Encounter (Signed)
Dr. Corliss Skainseveshwar had discussed about putting her on a different medication besides the PLQ.  Her PLQ is running out and is not sure what other medication Dr Corliss Skainseveshwar is wanting to put her on.  CB#[phone].  Thank you.

## 2016-06-25 ENCOUNTER — Ambulatory Visit (INDEPENDENT_AMBULATORY_CARE_PROVIDER_SITE_OTHER): Payer: Federal, State, Local not specified - PPO | Admitting: Internal Medicine

## 2016-06-25 DIAGNOSIS — M351 Other overlap syndromes: Secondary | ICD-10-CM

## 2016-06-25 DIAGNOSIS — R0989 Other specified symptoms and signs involving the circulatory and respiratory systems: Secondary | ICD-10-CM

## 2016-06-25 DIAGNOSIS — R0600 Dyspnea, unspecified: Secondary | ICD-10-CM

## 2016-06-25 DIAGNOSIS — R0689 Other abnormalities of breathing: Secondary | ICD-10-CM

## 2016-06-25 LAB — PULMONARY FUNCTION TEST
DL/VA % pred: 116 %
DL/VA: 4.86 ml/min/mmHg/L
DLCO UNC % PRED: 73 %
DLCO unc: 13.7 ml/min/mmHg
FEF 25-75 PRE: 2.35 L/s
FEF2575-%Pred-Pre: 146 %
FEV1-%PRED-PRE: 112 %
FEV1-PRE: 1.81 L
FEV1FVC-%Pred-Pre: 120 %
FEV6-%PRED-PRE: 97 %
FEV6-Pre: 1.93 L
FEV6FVC-%PRED-PRE: 104 %
FVC-%PRED-PRE: 93 %
FVC-Pre: 1.93 L
PRE FEV1/FVC RATIO: 94 %
Pre FEV6/FVC Ratio: 100 %

## 2016-06-25 NOTE — Progress Notes (Signed)
PFT done today. 

## 2016-06-26 ENCOUNTER — Encounter: Payer: Self-pay | Admitting: Internal Medicine

## 2016-06-26 ENCOUNTER — Telehealth: Payer: Self-pay | Admitting: Pharmacist

## 2016-06-26 ENCOUNTER — Ambulatory Visit (INDEPENDENT_AMBULATORY_CARE_PROVIDER_SITE_OTHER): Payer: Federal, State, Local not specified - PPO | Admitting: Internal Medicine

## 2016-06-26 DIAGNOSIS — J84112 Idiopathic pulmonary fibrosis: Secondary | ICD-10-CM

## 2016-06-26 MED ORDER — HYDROXYCHLOROQUINE SULFATE 200 MG PO TABS: 200.0000 mg | ORAL_TABLET | Freq: Every day | ORAL | 0 refills | Status: DC

## 2016-06-26 NOTE — Progress Notes (Signed)
Subjective:     Patient ID: Madeline Harper, female   DOB: 17-Dec-1950, 66 y.o.   MRN: [id number]  HPI  IOV 05/22/2016    HPI  Chief Complaint  Patient presents with  . Pulmonary Consult    Pt referred by Dr. Corliss Skains for abnormal lung sounds. Pt c/o DOE, prod cough with yellow mucus in morning - resolves throughout the day. Pt denies CP/tightness and f/c/s.      Notes from rheumatologist Dr. D reviewed and summarized Patient has a constellation of ANA greater than 1:1280, positive double-stranded DNA, positive RNP, positive Ro, low C3 and C4 associated with elevated sedimentation rate of 115, arthritis and raynaud. 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 During visit on 03/27/2016 based on my summarization of the chart crackles was observed in the flowsheet been referred here.  Madeline Harper presents for evaluation today. She tells me that for the last 1 year she's had arthralgia and Raynaud symptoms this then resulted in the mixed connective tissue diagnosis as stated above. She also tells me for the last 1 year after having a cold she's had chronic cough associated with sputum production. It is particularly worse in the daytime but she does cough during the rest of the day. She also tells me that she has mild dyspnea and exertion for climbing stairs. She tells me that she works out regularly at [employer] but she does not feel dyspneic although she demonstrated that she has labored breathing. This all improved with rest. 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 She is also wondering if the doctors of performing unnecessary tests on her.   Results for NASIA, CANNAN (MRN [id number]) as of 05/22/2016 12:00  Ref. Range 02/28/2016 09:59  Creatinine Latest Ref Range: 0.50 - 0.99 mg/dL 7.82    She had a chest x-ray 03/27/2016: Shows coarse interstitial markings suggestive of  interstitial lung disease  Results for YSIDRA, SOPHER (MRN [id number]) as of 05/22/2016 12:00  Ref. Range 02/28/2016 09:59  Cyclic Citrullin Peptide Ab Latest Units: Units <16  ds DNA Ab Latest Units: IU/mL 14 (H)  C3 Complement Latest Ref Range: 90 - 180 mg/dL 40 (L)  C4 Complement Latest Ref Range: 16 - 47 mg/dL 8 (L)  IgG (Immunoglobin G), Serum Latest Ref Range: 694 - 1,618 mg/dL 5,784 (H)  IgA Latest Ref Range: 81 - 463 mg/dL 696  EXBMW-4-XLKGMWNU Latest Ref Range: 0.2 - 0.3 g/dL 0.3  UVOZD-6-UYQIHKVQ Latest Ref Range: 0.5 - 0.9 g/dL 0.8  Ribonucleic Protein(ENA) Antibody, IgG Latest Ref Range: <1.0 NEG AI  >8.0 POS (H)  SSA (Ro) (ENA) Antibody, IgG Latest Ref Range: <1.0 NEG AI  5.7 POS (H)  SSB (La) (ENA) Antibody, IgG Latest Ref Range: <1.0 NEG AI  <1.0 NEG  Scleroderma (Scl-70) (ENA) Antibody, IgG Latest Ref Range: <1.0 NEG AI  <1.0 NEG      has a past medical history of Bone spur of foot.   reports that she quit smoking about 36 years ago. Her smoking use included Cigarettes. She has a 30.00 pack-year smoking history. She has never used smokeless tobacco.   OV 06/26/2016  Chief Complaint  Patient presents with  . Follow-up    Pt here after CT chest, PFT, and echo. Pt deneis change in breahting since last OV.    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 In the interim she did see Dr. Algis Downs on 05/28/2016 and has been recommended Imuran based on her symptoms, autoimmune profile and presence of UIP on CT chest. The show that she has grade 1 diastolic dysfunction associated borderline elevation of pulmonary artery systolic pressure which could easily be due to grade 1 diastolic dysfunction. 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 Based on pulmonary function testing and is only mild severe   The overwhelming thing for this visit is that she is extremely  scared to take Imuran another immune suppressants. She is worried about the cancer risk. We talked about the spiritual distress that she is in. We talked about the pros and cons of these medications. We talked about the risk of disease progression particularly pulmonary fibrosis. We talked about the future of drugs that are less toxic being developed such as Pirfenidone (Esbriet) and Ofev . We talked about  having to manage her chronic disease not be in denial. After all this she was somewhat reassured we talked about the need to monitor  She does have early morning cough with some mild sputum for which she wants symptom relief   Echocardiogram 05/29/16: Shows ejection fraction of 65% with grade 1 diastoic dysfunction and a PSA systolic pressure off 28 mmHg  IMPRESSION: 1. 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 2. Aortic atherosclerosis.   Electronically Signed   By: Trudie Reedaniel  Entrikin M.D.   On: 05/28/2016 14:41    Results for Madeline KinsmanWALKER, Madeline (MRN [id number]) as of 06/26/2016 10:41  Ref. Range 06/25/2016 11:11  FVC-Pre Latest Units: L 1.93  FVC-%Pred-Pre Latest Units: % 93  FEV1-Pre Latest Units: L 1.81  FEV1-%Pred-Pre Latest Units: % 112  Pre FEV1/FVC ratio Latest Units: % 94  Results for Madeline KinsmanWALKER, Madeline (MRN [id number]) as of 06/26/2016 10:41  Ref. Range 06/25/2016 11:11  DLCO unc Latest Units: ml/min/mmHg 13.70  DLCO unc % pred Latest Units: % 73      has a past medical history of Bone spur of foot.   reports that she quit smoking about 36 years ago. Her smoking use included Cigarettes. She has a 30.00 pack-year smoking history. She has never used smokeless tobacco.  Past Surgical History:  Procedure Laterality Date  . KIDNEY STONE SURGERY  1972  . TUBAL LIGATION  1970    Allergies  Allergen Reactions  . Lyrica [Pregabalin] Swelling    Pedal edema       There is no immunization history on file for this patient.  Family History  Problem Relation Age of Onset  . Diabetes Mother   . Heart disease Mother   . Kidney failure Mother   . Cancer Mother     Uterine Cancer  . Diabetes Maternal Grandmother   . Kidney failure Maternal Grandmother   . Stroke Sister   . Gout Maternal Grandfather      Current Outpatient Prescriptions:  .  acetaminophen (TYLENOL 8 HOUR ARTHRITIS PAIN) 650 MG CR tablet, Take 650 mg by mouth every 8 (eight) hours as needed for pain., Disp: , Rfl:  .  calcium carbonate (CALCIUM 600) 600 MG TABS tablet, Take 600 mg by mouth daily., Disp: , Rfl:  .  estradiol (ESTRACE VAGINAL) 0.1 MG/GM vaginal cream, as needed. , Disp: , Rfl:  .  hydroxychloroquine (PLAQUENIL) 200 MG tablet, Take 1 tablet (200 mg total) by mouth daily.,  Disp: 90 tablet, Rfl: 0 .  Multiple Vitamins-Minerals (MULTI ADULT GUMMIES PO), Take 1 tablet by mouth daily., Disp: , Rfl:  .  predniSONE (DELTASONE) 5 MG tablet, Take 2 tablets (10 mg total) by mouth daily with breakfast., Disp: 180 tablet, Rfl: 0 .  tiZANidine (ZANAFLEX) 4 MG tablet, every 6 (six) hours as needed. , Disp: , Rfl:    Review of Systems     Objective:   Physical Exam  Vitals:   06/26/16 1029  BP: 138/80  Pulse: 85  SpO2: 98%  Weight: 141 lb (64 kg)  Height: 4\' 11"  (1.499 m)   Estimated body mass index is 28.48 kg/m as calculated from the following:   Height as of this encounter: 4\' 11"  (1.499 m).   Weight as of this encounter: 141 lb (64 kg).  discusion only     Assessment:       ICD-9-CM ICD-10-CM   1. 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 No lung volume or bd response.  - return sooner if needed  > 50% of this > 25 min visit spent in face to face counseling or coordination of care     Dr. Kalman Shan, M.D., [facility].C.P Pulmonary and Critical Care Medicine Staff Physician [facility] Pager: [phone], If no answer or between  15:00h - 7:00h: call [phone]  06/26/2016 11:05 AM

## 2016-06-26 NOTE — Patient Instructions (Addendum)
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 No lung volume or bd response.  - return sooner if needed

## 2016-06-26 NOTE — Telephone Encounter (Signed)
Received a phone call from Mrs. Orzechowski today.  She reports she had an appointment with her pulmonologist, Dr. Marchelle Gearingamaswamy, and has decided she wants to start Imuran.  Reviewed patient's chart and noted that she had two indeterminant TB Gold tests.  Advised patient she will need TB skin test (PPD) before we can start Imuran.  Patient confirms she will get PPD and bring the results to her appointment on 07/18/16.  We will also need Imuran consent at that time.   Patient needs refill of Plaquenil.   Last visit: 06/05/16 Next visit: 07/18/16 Labs: 06/05/16 CMP, CBC normal  Ok to refill Plaquenil?

## 2016-06-26 NOTE — Assessment & Plan Note (Addendum)
We discussed at length pro- and cono of having to take immuran and other immune suppressants  At this point your pulmonary fibrosis is mild and in order to keep it where it is, support recommendation to accept risks of medication and take the immune suppressant  In future, if less toxic drugs get approved (eg Ofev or esbriet) we can certainly start those  Plan - please revisit with Dr Corliss Skainseveshwar to tsart immune suppression - recommend OTC prilosec 20mg  per day - acid reflux Rx can help slow the fibrosis - recommend nasal steroid OTC - recommend OTC mucines for cough  - return in 6 months with Pre-bd spiro and dlco only. No lung volume or bd response.  - return sooner if needed

## 2016-06-26 NOTE — Telephone Encounter (Signed)
Ok to refill PLQ. Will start Imuran at fu if PPD is neg

## 2016-07-12 ENCOUNTER — Telehealth (INDEPENDENT_AMBULATORY_CARE_PROVIDER_SITE_OTHER): Payer: Self-pay | Admitting: Radiology

## 2016-07-12 ENCOUNTER — Telehealth: Payer: Self-pay | Admitting: *Deleted

## 2016-07-12 DIAGNOSIS — M351 Other overlap syndromes: Secondary | ICD-10-CM | POA: Insufficient documentation

## 2016-07-12 NOTE — Telephone Encounter (Signed)
Labs and office notes faxed

## 2016-07-12 NOTE — Telephone Encounter (Signed)
Aneese calling from [facility] Rheumat[facility] ph [phone], fax [phone]. They state patient was referred to their office but it is missing the clinic notes and labs necessary for them to schedule a patient. They request that this information please be resent so they can schedule the patient.

## 2016-07-12 NOTE — Progress Notes (Addendum)
Office Visit Note  Patient: Madeline Harper             Date of Birth: 1950/12/19           MRN: [id number]             PCP: Nilda Simmer, MD Referring: Delilah Shan, MD Visit Date: 07/18/2016 Occupation: '@GUAROCC'$ @    Subjective:  Pain in hands   History of Present Illness: Madeline Harper is a 66 y.o. female with history of mixed connective tissue disease. She states she continues to have some discomfort in her hands. She has difficulty making a fist. She also complains of discomfort in her bilateral shoulders. She saw Dr. Chase Caller for evaluation of her pulmonary disease. He diagnosed her with UIP and recommended use of Imuran. He felt that her pulmonary disease is mild and she has mild pulmonary hypertension. She is currently on prednisone 10 mg by mouth daily and Plaquenil 200 mg by mouth daily. She denies any shortness of breath at rest. She does have some shortness of breath on exertion.   Activities of Daily Living:  Patient reports morning stiffness for 30-60 minutes.   Patient Denies nocturnal pain.  Difficulty dressing/grooming: Denies Difficulty climbing stairs: Denies Difficulty getting out of chair: Denies Difficulty using hands for taps, buttons, cutlery, and/or writing: Reports   Review of Systems  Constitutional: Positive for fatigue. Negative for night sweats, weight gain, weight loss and weakness.  HENT: Negative for mouth sores, trouble swallowing, trouble swallowing, mouth dryness and nose dryness.   Eyes: Negative for pain, redness, visual disturbance and dryness.  Respiratory: Positive for shortness of breath. Negative for cough and difficulty breathing.        On exertion only  Cardiovascular: Negative for chest pain, palpitations, hypertension, irregular heartbeat and swelling in legs/feet.  Gastrointestinal: Negative for blood in stool, constipation and diarrhea.  Endocrine: Negative for increased urination.  Genitourinary: Negative for vaginal dryness.    Musculoskeletal: Positive for arthralgias, joint pain, joint swelling and morning stiffness. Negative for myalgias, muscle weakness, muscle tenderness and myalgias.  Skin: Positive for color change. Negative for rash, hair loss, skin tightness, ulcers and sensitivity to sunlight.  Allergic/Immunologic: Negative for susceptible to infections.  Neurological: Negative for dizziness, memory loss and night sweats.  Hematological: Negative for swollen glands.  Psychiatric/Behavioral: Positive for sleep disturbance. Negative for depressed mood. The patient is not nervous/anxious.     PMFS History:  Patient Active Problem List   Diagnosis Date Noted  . Mixed connective tissue disease ([place]) 07/12/2016  . 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 06/26/2016  . Interstitial lung disease ([place]) 06/04/2016  . High risk medication use 05/28/2016  . Raynaud's disease without gangrene 05/28/2016  . Autoimmune disease ([place]) 03/27/2016  . Bilateral foot pain 03/27/2016  . Trigger index finger of right hand 03/27/2016  . Elevated sedimentation rate 02/28/2016  . Positive ANA (antinuclear antibody) 02/27/2016  . Bilateral shoulder pain 02/27/2016  . Status post left foot surgery 06/28/2015  . Tailor's bunion of left foot 06/28/2015  . Bone spur of foot 06/14/2015    Past Medical History:  Diagnosis Date  . Bone spur of foot     Family History  Problem Relation Age of Onset  . Diabetes Mother   . Heart disease Mother   . Kidney failure Mother   . Cancer Mother     Uterine Cancer  . Diabetes Maternal Grandmother   . Kidney failure Maternal Grandmother   .  Stroke Sister   . Gout Maternal Grandfather    Past Surgical History:  Procedure Laterality Date  . [place]  . TUBAL LIGATION  1970   Social History   Social History Narrative   Lives with husband. Pt works in [employer]     Objective: Vital Signs: BP 124/74   Pulse 80   Resp 16   Wt 145 lb (65.8  kg)   BMI 29.29 kg/m    Physical Exam  Constitutional: She is oriented to person, place, and time. She appears well-developed and well-nourished.  HENT:  Head: Normocephalic and atraumatic.  Eyes: Conjunctivae and EOM are normal.  Neck: Normal range of motion.  Cardiovascular: Normal rate, regular rhythm, normal heart sounds and intact distal pulses.   Pulmonary/Chest: Effort normal. She has rales.  In bilateral lungs  Abdominal: Soft. Bowel sounds are normal.  Lymphadenopathy:    She has no cervical adenopathy.  Neurological: She is alert and oriented to person, place, and time.  Skin: Skin is warm and dry. Capillary refill takes less than 2 seconds.  Psychiatric: She has a normal mood and affect. Her behavior is normal.  Nursing note and vitals reviewed.    Musculoskeletal Exam: C-spine and thoracic lumbar spine good range of motion. Shoulder joints elbow joints wrist joint MCPs PIPs DIPs with good range of motion with no synovitis on examination. Hip joints knee joints ankles MTPs PIPs DIPs with good range of motion with no swelling or synovitis.  CDAI Exam: No CDAI exam completed.    Investigation: Findings:  06/26/2016 PPD negative    Clinical Support on 06/25/2016  Component Date Value Ref Range Status  . FVC-Pre 06/25/2016 1.93  L Preliminary  . FVC-%Pred-Pre 06/25/2016 93  % Preliminary  . FEV1-Pre 06/25/2016 1.81  L Preliminary  . FEV1-%Pred-Pre 06/25/2016 112  % Preliminary  . FEV6-Pre 06/25/2016 1.93  L Preliminary  . FEV6-%Pred-Pre 06/25/2016 97  % Preliminary  . Pre FEV1/FVC ratio 06/25/2016 94  % Preliminary  . FEV1FVC-%Pred-Pre 06/25/2016 120  % Preliminary  . Pre FEV6/FVC Ratio 06/25/2016 100  % Preliminary  . FEV6FVC-%Pred-Pre 06/25/2016 104  % Preliminary  . FEF 25-75 Pre 06/25/2016 2.35  L/sec Preliminary  . FEF2575-%Pred-Pre 06/25/2016 146  % Preliminary  . DLCO unc 06/25/2016 13.70  ml/min/mmHg Preliminary  . DLCO unc % pred 06/25/2016 73  %  Preliminary  . DL/VA 06/25/2016 4.86  ml/min/mmHg/L Preliminary  . DL/VA % pred 06/25/2016 116  % Preliminary  Orders Only on 06/12/2016  Component Date Value Ref Range Status  . Interferon Gamma Release Assay 06/12/2016 INDETERM* NEGATIVE Final   Comment: Results are indeterminate for response to ESAT-6,TB7.7 and/or CFP-10 test antigens.   . Quantiferon Nil Value 06/12/2016 0.02  IU/mL Final  . Mitogen-Nil 06/12/2016 0.16  IU/mL Final  . Quantiferon Tb Ag Minus Nil Value 06/12/2016 0.00  IU/mL Final   Comment:   The Nil tube value is used to determine if the patient has a preexisting immune response which could cause a false-positive reading on the test. In order for a test to be valid, the Nil tube must have a value of less than or equal to 8.0 IU/mL.   The mitogen control tube is used to assure the patient has a healthy immune status and also serves as a control for correct blood handling and incubation. It is used to detect false-negative readings. The mitogen tube must have a gamma interferon value of greater than or equal to  0.5 IU/mL higher than the value of the Nil tube.   The TB antigen tube is coated with the M. tuberculosis specific antigens. For a test to be considered positive, the TB antigen tube value minus the Nil tube value must be greater than or equal to 0.35 IU/mL.   For additional information, please refer to http://education.questdiagnostics.com/faq/QFT (This link is being provided for informational/educational purposes only.)   Office Visit on 06/05/2016  Component Date Value Ref Range Status  . WBC 06/05/2016 6.1  3.8 - 10.8 K/uL Final  . RBC 06/05/2016 4.60  3.80 - 5.10 MIL/uL Final  . Hemoglobin 06/05/2016 12.2  11.7 - 15.5 g/dL Final  . HCT 06/05/2016 38.8  35.0 - 45.0 % Final  . MCV 06/05/2016 84.3  80.0 - 100.0 fL Final  . MCH 06/05/2016 26.5* 27.0 - 33.0 pg Final  . MCHC 06/05/2016 31.4* 32.0 - 36.0 g/dL Final  . RDW 06/05/2016 14.4  11.0 - 15.0  % Final  . Platelets 06/05/2016 258  140 - 400 K/uL Final  . MPV 06/05/2016 10.9  7.5 - 12.5 fL Final  . Neutro Abs 06/05/2016 5002  1,500 - 7,800 cells/uL Final  . Lymphs Abs 06/05/2016 732* 850 - 3,900 cells/uL Final  . Monocytes Absolute 06/05/2016 366  200 - 950 cells/uL Final  . Eosinophils Absolute 06/05/2016 0* 15 - 500 cells/uL Final  . Basophils Absolute 06/05/2016 0  0 - 200 cells/uL Final  . Neutrophils Relative % 06/05/2016 82  % Final  . Lymphocytes Relative 06/05/2016 12  % Final  . Monocytes Relative 06/05/2016 6  % Final  . Eosinophils Relative 06/05/2016 0  % Final  . Basophils Relative 06/05/2016 0  % Final  . Smear Review 06/05/2016 Criteria for review not met   Final  . Sodium 06/05/2016 139  135 - 146 mmol/L Final  . Potassium 06/05/2016 4.3  3.5 - 5.3 mmol/L Final  . Chloride 06/05/2016 104  98 - 110 mmol/L Final  . CO2 06/05/2016 21  20 - 31 mmol/L Final  . Glucose, Bld 06/05/2016 109* 65 - 99 mg/dL Final  . BUN 06/05/2016 16  7 - 25 mg/dL Final  . Creat 06/05/2016 0.91  0.50 - 0.99 mg/dL Final   Comment:   For patients > or = 65 years of age: The upper reference limit for Creatinine is approximately 13% higher for people identified as African-American.     . Total Bilirubin 06/05/2016 0.3  0.2 - 1.2 mg/dL Final  . Alkaline Phosphatase 06/05/2016 61  33 - 130 U/L Final  . AST 06/05/2016 20  10 - 35 U/L Final  . ALT 06/05/2016 18  6 - 29 U/L Final  . Total Protein 06/05/2016 7.9  6.1 - 8.1 g/dL Final  . Albumin 06/05/2016 3.6  3.6 - 5.1 g/dL Final  . Calcium 06/05/2016 9.7  8.6 - 10.4 mg/dL Final  . GFR, Est African American 06/05/2016 77  >=60 mL/min Final  . GFR, Est Non African American 06/05/2016 67  >=60 mL/min Final  . WBC 06/05/2016 6.2  3.8 - 10.8 K/uL Final  . RBC 06/05/2016 4.55  3.80 - 5.10 MIL/uL Final  . Hemoglobin 06/05/2016 12.2  11.7 - 15.5 g/dL Final  . HCT 06/05/2016 38.7  35.0 - 45.0 % Final  . MCV 06/05/2016 85.1  80.0 - 100.0 fL Final   . MCH 06/05/2016 26.8* 27.0 - 33.0 pg Final  . MCHC 06/05/2016 31.5* 32.0 - 36.0 g/dL Final  . RDW  06/05/2016 14.5  11.0 - 15.0 % Final  . Platelets 06/05/2016 248  140 - 400 K/uL Final  . MPV 06/05/2016 10.9  7.5 - 12.5 fL Final  . Neutro Abs 06/05/2016 5084  1,500 - 7,800 cells/uL Final  . Lymphs Abs 06/05/2016 806* 850 - 3,900 cells/uL Final  . Monocytes Absolute 06/05/2016 310  200 - 950 cells/uL Final  . Eosinophils Absolute 06/05/2016 0* 15 - 500 cells/uL Final  . Basophils Absolute 06/05/2016 0  0 - 200 cells/uL Final  . Neutrophils Relative % 06/05/2016 82  % Final  . Lymphocytes Relative 06/05/2016 13  % Final  . Monocytes Relative 06/05/2016 5  % Final  . Eosinophils Relative 06/05/2016 0  % Final  . Basophils Relative 06/05/2016 0  % Final  . Smear Review 06/05/2016 Criteria for review not met   Final  . Sodium 06/05/2016 CANCELED  135 - 146 mmol/L Final   Comment: DUPLICATE SEE [id number]  Result canceled by the ancillary   . Potassium 06/05/2016 CANCELED  3.5 - 5.3 mmol/L Final  . Chloride 06/05/2016 CANCELED  98 - 110 mmol/L Final  . CO2 06/05/2016 CANCELED  20 - 31 mmol/L Final  . Glucose, Bld 06/05/2016 CANCELED  65 - 99 mg/dL Final  . BUN 06/05/2016 CANCELED  7 - 25 mg/dL Final  . Creat 06/05/2016 CANCELED  0.50 - 0.99 mg/dL Final  . Total Bilirubin 06/05/2016 CANCELED  0.2 - 1.2 mg/dL Final  . Alkaline Phosphatase 06/05/2016 CANCELED  33 - 130 U/L Final  . AST 06/05/2016 CANCELED  10 - 35 U/L Final  . ALT 06/05/2016 CANCELED  6 - 29 U/L Final  . Total Protein 06/05/2016 CANCELED  6.1 - 8.1 g/dL Final  . Albumin 06/05/2016 CANCELED  3.6 - 5.1 g/dL Final  . Calcium 06/05/2016 CANCELED  8.6 - 10.4 mg/dL Final  . GFR, Est African American 06/05/2016 CANCELED  >=60 mL/min Final  . GFR, Est Non African American 06/05/2016 CANCELED  >=60 mL/min Final  . Thiopurine Methltransferase, RBC 06/05/2016 17  nmol/hr/mL RBC Final   Comment:   Reference Range for TPMT  Activity:      >12       Normal   4-12       Heterozygote or low metabolizer     <4       Homozygote Deficient Range   This test was developed and its analytical performance characteristics have been determined by [facility]. It has not been cleared or approved by FDA. This assay has been validated pursuant to the CLIA regulations and is used for clinical purposes.   Warren Danes Release Assay 06/05/2016 INDETERM* NEGATIVE Final   Comment: Results are indeterminate for response to ESAT-6,TB7.7 and/or CFP-10 test antigens.   . Quantiferon Nil Value 06/05/2016 0.02  IU/mL Final  . Mitogen-Nil 06/05/2016 0.11  IU/mL Final  . Quantiferon Tb Ag Minus Nil Value 06/05/2016 <0.00  IU/mL Final   Comment:   The Nil tube value is used to determine if the patient has a preexisting immune response which could cause a false-positive reading on the test. In order for a test to be valid, the Nil tube must have a value of less than or equal to 8.0 IU/mL.   The mitogen control tube is used to assure the patient has a healthy immune status and also serves as a control for correct blood handling and incubation. It is used to detect false-negative readings. The mitogen  tube must have a gamma interferon value of greater than or equal to 0.5 IU/mL higher than the value of the Nil tube.   The TB antigen tube is coated with the M. tuberculosis specific antigens. For a test to be considered positive, the TB antigen tube value minus the Nil tube value must be greater than or equal to 0.35 IU/mL.   For additional information, please refer to http://education.questdiagnostics.com/faq/QFT (This link is being provided for informational/educational purposes only.)      Imaging: No results found.  Speciality Comments: No specialty comments available.    Procedures:  No procedures performed Allergies: Lyrica [pregabalin]   Assessment / Plan:       Visit Diagnoses: Mixed connective tissue disease ([place]) - Positive ANA, double-stranded DNA, RNP, SSA, elevated ESR, hypocomplementemia, Raynauds, arthritis, interstitial lung disease. She continues to have some arthralgias and shortness of breath on exertion. She had very good visit with Dr. Chase Caller and is motivated to start on Imuran now. She would like to discontinue Plaquenil when she starts Imuran. We were in agreement with that. Indications side effects contraindications of Imuran were discussed today at length. Informed consent was taken. The plan is to start her on Imuran 50 mg by mouth daily for 2 weeks and then increase to 100 mg by mouth daily if her labs are normal. We will check her labs CBC, CMP 2 weeks 2 and then every 2 months. I also plan to taper her prednisone to 7.5 mg by mouth daily after 2 weeks of Imuran and then decrease it by 2.5 mg every month.  Raynaud's disease without gangrene: Continues to be active.  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 Golden Pop notes. She will continue to follow-up with Dr. Chase Caller.  High risk medication use - Plaquenil 200 mg po qd, prednisone10 mg po qd. She will discontinue Plaquenil and start Imuran. Prednisone tapering schedule will be given to her.  Chronic pain of both shoulders  Bilateral foot pain     Orders: No orders of the defined types were placed in this encounter.  Meds ordered this encounter  Medications  . azaTHIOprine (IMURAN) 50 MG tablet    Sig: Take 1 tablet (50 mg total) by mouth daily. For two weeks then increase dose to 100 mg daily if labs are normal    Dispense:  60 tablet    Refill:  2    Face-to-face time spent with patient was 30 minutes. 50% of time was spent in counseling and coordination of care.  Follow-Up Instructions: Return in about 2 months (around 09/17/2016) for Mixed connective tissue  disease.   Bo Merino, MD  Note - This record has been created using Editor, commissioning.  Chart creation errors have been sought, but may not always  have been located. Such creation errors do not reflect on  the standard of medical care.

## 2016-07-12 NOTE — Telephone Encounter (Signed)
See message.

## 2016-07-12 NOTE — Telephone Encounter (Signed)
Received fax from [facility] with order for EKG for patient. Patient lost this order in their store and they faxed it to our office. Mailed order to patient.

## 2016-07-18 ENCOUNTER — Encounter: Payer: Self-pay | Admitting: Rheumatology

## 2016-07-18 ENCOUNTER — Ambulatory Visit (INDEPENDENT_AMBULATORY_CARE_PROVIDER_SITE_OTHER): Payer: Federal, State, Local not specified - PPO | Admitting: Rheumatology

## 2016-07-18 VITALS — BP 124/74 | HR 80 | Resp 16 | Wt 145.0 lb

## 2016-07-18 DIAGNOSIS — M351 Other overlap syndromes: Secondary | ICD-10-CM

## 2016-07-18 DIAGNOSIS — M79671 Pain in right foot: Secondary | ICD-10-CM

## 2016-07-18 DIAGNOSIS — I7300 Raynaud's syndrome without gangrene: Secondary | ICD-10-CM

## 2016-07-18 DIAGNOSIS — M25512 Pain in left shoulder: Secondary | ICD-10-CM

## 2016-07-18 DIAGNOSIS — M79672 Pain in left foot: Secondary | ICD-10-CM

## 2016-07-18 DIAGNOSIS — G8929 Other chronic pain: Secondary | ICD-10-CM | POA: Diagnosis not present

## 2016-07-18 DIAGNOSIS — Z79899 Other long term (current) drug therapy: Secondary | ICD-10-CM | POA: Diagnosis not present

## 2016-07-18 DIAGNOSIS — J84112 Idiopathic pulmonary fibrosis: Secondary | ICD-10-CM

## 2016-07-18 DIAGNOSIS — M25511 Pain in right shoulder: Secondary | ICD-10-CM

## 2016-07-18 MED ORDER — AZATHIOPRINE 50 MG PO TABS: 50.0000 mg | ORAL_TABLET | Freq: Every day | ORAL | 2 refills | Status: DC

## 2016-07-18 NOTE — Progress Notes (Signed)
Pharmacy Note  Subjective: Patient presents today to the [facility] to see Dr. Corliss Skains.  Patient seen by the pharmacist for counseling on azathioprine (Imuran).    Objective: CMP  CMP Latest Ref Rng & Units 06/05/2016 06/05/2016 02/28/2016  Glucose 65 - 99 mg/dL CANCELED 409(W) 119(J)  BUN 7 - 25 mg/dL CANCELED 16 18  Creatinine 0.50 - 0.99 mg/dL CANCELED 4.78 2.95  Sodium 135 - 146 mmol/L CANCELED 139 140  Potassium 3.5 - 5.3 mmol/L CANCELED 4.3 4.4  Chloride 98 - 110 mmol/L CANCELED 104 103  CO2 20 - 31 mmol/L CANCELED 21 23  Calcium 8.6 - 10.4 mg/dL CANCELED 9.7 9.3  Total Protein 6.1 - 8.1 g/dL CANCELED 7.9 7.5  Total Bilirubin 0.2 - 1.2 mg/dL CANCELED 0.3 0.5  Alkaline Phos 33 - 130 U/L CANCELED 61 83  AST 10 - 35 U/L CANCELED 20 16  ALT 6 - 29 U/L CANCELED 18 30(H)   CBC    Component Value Date/Time   WBC 6.2 06/05/2016 1230   RBC 4.55 06/05/2016 1230   HGB 12.2 06/05/2016 1230   HCT 38.7 06/05/2016 1230   PLT 248 06/05/2016 1230   MCV 85.1 06/05/2016 1230   MCH 26.8 (L) 06/05/2016 1230   MCHC 31.5 (L) 06/05/2016 1230   RDW 14.5 06/05/2016 1230   LYMPHSABS 806 (L) 06/05/2016 1230   MONOABS 310 06/05/2016 1230   EOSABS 0 (L) 06/05/2016 1230   BASOSABS 0 06/05/2016 1230   TPMT: 17 - normal (06/05/2016) TB Skin test: negative (06/29/2016)  Assessment/Plan: Patient was counseled on the purpose, proper use, and adverse effects of azathioprine including risk of infection, nausea, rash, and hair loss.  Reviewed risk of cancer after long term use.  Discussed risk of myelosupression and reviewed importance of frequent lab work to monitor blood counts.  Reviewed for drug-drug interactions.  No interactions noted.  Provided patient with educational materials on azathioprine and answered all questions.  Patient consented to azathioprine.  Will upload consent into the media tab.    Lilla Shook, Pharm.D., BCPS Clinical Pharmacist Pager: [phone] Phone:  [phone] 07/18/2016 10:25 AM

## 2016-07-18 NOTE — Patient Instructions (Addendum)
Prednisone taper using 5 mg tablets:  Continue prednisone 10 mg (2 tablets) daily for two weeks, then decrease dose to 7.5 mg (1 and 1/2 tablet) daily for one month, then decrease dose to 5 mg (1 tablet) daily for one month, then decrease dose to 2.5 mg (1/2 tablet) daily for one month, then decrease dose to 2.5 mg (1/2 tablet) every other day for one month.    Start Imuran 50 mg daily for two weeks then increase dose to 100 mg daily if the labs are normal.   Stop hydroxychloroquine (Plaquenil)   Standing Labs We placed an order today for your standing lab work.    Please come back and get your standing labs in 2 weeks times 2 then every 2 months  We have open lab Monday through Friday from 8:30-11:30 AM and 1:30-4 PM at the office of Dr. Arbutus Ped, PA.   The office is located at [address], [address], [place], [place] [address] No appointment is necessary.   Labs are drawn by [facility].  You may receive a bill from [facility] for your lab work.     Azathioprine tablets What is this medicine? AZATHIOPRINE (ay za THYE oh preen) suppresses the immune system. It is used to prevent organ rejection after a transplant. It is also used to treat rheumatoid arthritis. This medicine may be used for other purposes; ask your health care provider or pharmacist if you have questions. COMMON BRAND NAME(S): Azasan, Imuran What should I tell my health care provider before I take this medicine? They need to know if you have any of these conditions: -infection -kidney disease -liver disease -an unusual or allergic reaction to azathioprine, other medicines, lactose, foods, dyes, or preservatives -pregnant or trying to get pregnant -breast feeding How should I use this medicine? Take this medicine by mouth with a full glass of water. Follow the directions on the prescription label. Take your medicine at regular intervals. Do not take your medicine more often than directed. Continue to  take your medicine even if you feel better. Do not stop taking except on your doctor's advice. Talk to your pediatrician regarding the use of this medicine in children. Special care may be needed. Overdosage: If you think you have taken too much of this medicine contact a poison control center or emergency room at once. NOTE: This medicine is only for you. Do not share this medicine with others. What if I miss a dose? If you miss a dose, take it as soon as you can. If it is almost time for your next dose, take only that dose. Do not take double or extra doses. What may interact with this medicine? Do not take this medicine with any of the following medications: -febuxostat -mercaptopurine This medicine may also interact with the following medications: -allopurinol -aminosalicylates like sulfasalazine, mesalamine, balsalazide, and olsalazine -leflunomide -medicines called ACE inhibitors like benazepril, captopril, enalapril, fosinopril, quinapril, lisinopril, ramipril, and trandolapril -mycophenolate -sulfamethoxazole; trimethoprim -vaccines -warfarin This list may not describe all possible interactions. Give your health care provider a list of all the medicines, herbs, non-prescription drugs, or dietary supplements you use. Also tell them if you smoke, drink alcohol, or use illegal drugs. Some items may interact with your medicine. What should I watch for while using this medicine? Visit your doctor or health care professional for regular checks on your progress. You will need frequent blood checks during the first few months you are receiving the medicine. If you get a cold  or other infection while receiving this medicine, call your doctor or health care professional. Do not treat yourself. The medicine may increase your risk of getting an infection. Women should inform their doctor if they wish to become pregnant or think they might be pregnant. There is a potential for serious side effects  to an unborn child. Talk to your health care professional or pharmacist for more information. Men may have a reduced sperm count while they are taking this medicine. Talk to your health care professional for more information. This medicine may increase your risk of getting certain kinds of cancer. Talk to your doctor about healthy lifestyle choices, important screenings, and your risk. What side effects may I notice from receiving this medicine? Side effects that you should report to your doctor or health care professional as soon as possible: -allergic reactions like skin rash, itching or hives, swelling of the face, lips, or tongue -changes in vision -confusion -fever, chills, or any other sign of infection -loss of balance or coordination -severe stomach pain -unusual bleeding, bruising -unusually weak or tired -vomiting -yellowing of the eyes or skin Side effects that usually do not require medical attention (report to your doctor or health care professional if they continue or are bothersome): -hair loss -nausea This list may not describe all possible side effects. Call your doctor for medical advice about side effects. You may report side effects to FDA at 1-800-FDA-1088. Where should I keep my medicine? Keep out of the reach of children. Store at room temperature between 15 and 25 degrees C (59 and 77 degrees F). Protect from light. Throw away any unused medicine after the expiration date. NOTE: This sheet is a summary. It may not cover all possible information. If you have questions about this medicine, talk to your doctor, pharmacist, or health care provider.  2018 Elsevier/Gold Standard (2013-07-20 12:00:31)

## 2016-07-29 ENCOUNTER — Telehealth: Payer: Self-pay | Admitting: Family Medicine

## 2016-07-29 NOTE — Telephone Encounter (Signed)
Received records from [facility]- healthcare for her.  Will abstract and scan as indicated.  It is mostly visits for shoulder pain and then around the time of her dx of mixed connective tissue disorder per rheumatology.  Not much of great relevance

## 2016-08-05 ENCOUNTER — Ambulatory Visit: Payer: Federal, State, Local not specified - PPO | Admitting: Rheumatology

## 2016-08-05 ENCOUNTER — Other Ambulatory Visit: Payer: Self-pay | Admitting: Radiology

## 2016-08-05 DIAGNOSIS — Z79899 Other long term (current) drug therapy: Secondary | ICD-10-CM

## 2016-08-05 LAB — CBC WITH DIFFERENTIAL/PLATELET
BASOS PCT: 0 %
Basophils Absolute: 0 cells/uL (ref 0–200)
EOS PCT: 3 %
Eosinophils Absolute: 102 cells/uL (ref 15–500)
HEMATOCRIT: 34.7 % — AB (ref 35.0–45.0)
HEMOGLOBIN: 11 g/dL — AB (ref 11.7–15.5)
LYMPHS ABS: 1156 {cells}/uL (ref 850–3900)
Lymphocytes Relative: 34 %
MCH: 27.4 pg (ref 27.0–33.0)
MCHC: 31.7 g/dL — AB (ref 32.0–36.0)
MCV: 86.5 fL (ref 80.0–100.0)
MONO ABS: 442 {cells}/uL (ref 200–950)
MPV: 10.6 fL (ref 7.5–12.5)
Monocytes Relative: 13 %
NEUTROS ABS: 1700 {cells}/uL (ref 1500–7800)
Neutrophils Relative %: 50 %
Platelets: 260 10*3/uL (ref 140–400)
RBC: 4.01 MIL/uL (ref 3.80–5.10)
RDW: 14.9 % (ref 11.0–15.0)
WBC: 3.4 10*3/uL — AB (ref 3.8–10.8)

## 2016-08-06 ENCOUNTER — Telehealth: Payer: Self-pay | Admitting: Pharmacist

## 2016-08-06 ENCOUNTER — Ambulatory Visit: Payer: Federal, State, Local not specified - PPO | Admitting: Rheumatology

## 2016-08-06 DIAGNOSIS — Z79899 Other long term (current) drug therapy: Secondary | ICD-10-CM

## 2016-08-06 LAB — CMP 10231
AG Ratio: 0.9 Ratio — ABNORMAL LOW (ref 1.0–2.5)
ALBUMIN: 3.4 g/dL — AB (ref 3.6–5.1)
ALT: 20 U/L (ref 6–29)
AST: 19 U/L (ref 10–35)
Alkaline Phosphatase: 57 U/L (ref 33–130)
BUN / CREAT RATIO: 19.8 ratio (ref 6–22)
BUN: 18 mg/dL (ref 7–25)
CALCIUM: 9.2 mg/dL (ref 8.6–10.4)
CO2: 27 mmol/L (ref 20–31)
CREATININE: 0.91 mg/dL (ref 0.50–0.99)
Chloride: 106 mmol/L (ref 98–110)
GFR, EST AFRICAN AMERICAN: 77 mL/min (ref >=60)
GFR, Est Non African American: 67 mL/min (ref >=60)
Globulin: 3.8 g/dL — ABNORMAL HIGH (ref 1.9–3.7)
Glucose, Bld: 93 mg/dL (ref 65–99)
POTASSIUM: 4 mmol/L (ref 3.5–5.3)
SODIUM: 141 mmol/L (ref 135–146)
Total Bilirubin: 0.4 mg/dL (ref 0.2–1.2)
Total Protein: 7.2 g/dL (ref 6.1–8.1)

## 2016-08-06 NOTE — Telephone Encounter (Signed)
I reviewed lab results with patient.  She confirms she is currently taking azathioprine (Imuran) 50 mg daily.  She did discontinue hydroxychloroquine.  I reviewed instructions with patient to continue Imuran 50 mg daily at this time and get repeat labs in 3 weeks.  Lab orders have been placed.  Patient voiced understanding.    Lilla Shook, Pharm.D., BCPS, CPP Clinical Pharmacist Pager: [phone] Phone: [phone] 08/06/2016 4:47 PM

## 2016-08-06 NOTE — Progress Notes (Signed)
Her WBC is low. We will keep her on Imuran 50 mg by mouth daily for right now and recheck her CBC in 3 weeks. If WBC states same or improves then I may increase her Imuran to 75 mg by mouth daily. She should discontinue her Plaquenil.

## 2016-08-06 NOTE — Telephone Encounter (Signed)
-----   Message from Pollyann Savoy, MD sent at 08/06/2016  4:43 PM EDT ----- Her WBC is low. We will keep her on Imuran 50 mg by mouth daily for right now and recheck her CBC in 3 weeks. If WBC states same or improves then I may increase her Imuran to 75 mg by mouth daily. She should discontinue her Plaquenil.

## 2016-08-08 ENCOUNTER — Telehealth: Payer: Self-pay | Admitting: Rheumatology

## 2016-08-08 NOTE — Telephone Encounter (Signed)
I returned patient's call.  She expressed some concern about the low WBC.  I advised patient we plan to monitor them again in 3 weeks.  Also reviewed instructions with patient to continue Imuran 50 mg daily at this time and not to increase to 100 mg daily.    Lilla Shookachel Samaa Ueda, Pharm.D., BCPS, CPP Clinical Pharmacist Pager: [phone] Phone: [phone] 08/08/2016 8:27 AM

## 2016-08-08 NOTE — Telephone Encounter (Signed)
Patient states she was returning Dr. Donnamarie PoagHenderson's call from the other day. Patient is requesting a call back at [phone].

## 2016-08-08 NOTE — Telephone Encounter (Signed)
LMAM to call back.

## 2016-08-09 ENCOUNTER — Telehealth: Payer: Self-pay

## 2016-08-09 NOTE — Telephone Encounter (Signed)
Pre visit call completed 

## 2016-08-11 NOTE — Progress Notes (Signed)
[facility] [address], [address] [place], [place] [address] [phone] [phone]  Date:  08/12/2016   Name:  Madeline KinsmanDiane Harper   DOB:  06/23/1950   MRN:  [id number]  PCP:  Wilburn MylarKelly, Samuel S, MD    Chief Complaint: Establish Care (Pt here to est care. recently dx with autoimmue disease slowly weaning off prednisone. due for mammogram. )   History of Present Illness:  Madeline KinsmanDiane Harper is a 66 y.o. very pleasant female patient who presents with the following:  Here today as a new patient to my clinic. History of mixed connective tissue disease- pt of Dr. Corliss Skainseveshwar.  She also has interstitial pneumonitis treated by Dr. Marchelle Gearingamaswamy- this is thought related to her MCDD She is being slowly weaned off her prednisone. She is suffering from the effects of weaning the prednisone- feels very tired  She is now on 7 mg prednisone- she had been on 10 mg max.  They plan to taper over several months She was dx with this disorder just last fall They discovered the issue due to a her sed rate being high   She has come here as she is seeking a new primary care doctor.    She works with the [employer]- she is in [employer].  She works part time and has been able to keep up with the demands of her job pretty well She is married and her husband is quite supportive. They have 2 children, several grands and 1 great grand  She was in good health prior to diagnosis of her autoimmune disorder She has noted a hard time concentrating and is feeling down.  She is having a hard time staying asleep at night.  She is in a lot of pain and will get stiff and sore- this will wake her up at night.  Also admits that she is likely having some depression as a result of being sick. No SI, but she has low energy, poor motivation, feels negative, does not feel like getting out like she used to.  She is not suffering from anxiety.   Admits that she has never had a mammogram- she is willing to  consider this esp if we can do it for her today!   She did have a CT of her chest in February  IMPRESSION: 1. 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 2. Aortic atherosclerosis.   Patient Active Problem List   Diagnosis Date Noted  . Mixed connective tissue disease (HCC) 07/12/2016  . UIP (usual interstitial pneumonitis) (HCC) [facility]  . Interstitial lung disease (HCC) 06/04/2016  . High risk medication use 05/28/2016  . Raynaud's disease without gangrene 05/28/2016  . Autoimmune disease (HCC) 03/27/2016  . Bilateral foot pain 03/27/2016  . Trigger index finger of right hand 03/27/2016  . Elevated sedimentation rate 02/28/2016  . Positive ANA (antinuclear antibody) 02/27/2016  . Bilateral shoulder pain 02/27/2016  . Status post left foot surgery 06/28/2015  . Tailor's bunion of left foot 06/28/2015  . Bone spur of foot 06/14/2015    Past Medical History:  Diagnosis Date  . Bone spur of foot   . Cataract     Past Surgical History:  Procedure Laterality Date  . KIDNEY STONE SURGERY  1972  . TUBAL LIGATION  1970    Social History  Substance Use  Topics  . Smoking status: Former Smoker    Packs/day: 2.00    Years: 15.00    Types: Cigarettes    Quit date: 04/08/1980  . Smokeless tobacco: Never Used  . Alcohol use 0.0 oz/week     Comment: Occ    Family History  Problem Relation Age of Onset  . Diabetes Mother   . Heart disease Mother   . Kidney failure Mother   . Cancer Mother     Uterine Cancer  . Diabetes Maternal Grandmother   . Kidney failure Maternal Grandmother   . Stroke Sister   . Gout Maternal Grandfather     Allergies  Allergen Reactions  . Lyrica [Pregabalin] Swelling    Pedal edema     Medication list has been reviewed and updated.  Current Outpatient Prescriptions on File Prior to Visit  Medication  Sig Dispense Refill  . acetaminophen (TYLENOL 8 HOUR ARTHRITIS PAIN) 650 MG CR tablet Take 650 mg by mouth every 8 (eight) hours as needed for pain.    Marland Kitchen azaTHIOprine (IMURAN) 50 MG tablet Take 1 tablet (50 mg total) by mouth daily. For two weeks then increase dose to 100 mg daily if labs are normal 60 tablet 2  . calcium carbonate (CALCIUM 600) 600 MG TABS tablet Take 600 mg by mouth daily.    Marland Kitchen estradiol (ESTRACE VAGINAL) 0.1 MG/GM vaginal cream as needed.     . Multiple Vitamins-Minerals (MULTI ADULT GUMMIES PO) Take 1 tablet by mouth daily.    . predniSONE (DELTASONE) 5 MG tablet Take 2 tablets (10 mg total) by mouth daily with breakfast. 180 tablet 0  . tiZANidine (ZANAFLEX) 4 MG tablet every 6 (six) hours as needed.      No current facility-administered medications on file prior to visit.     Review of Systems:  As per HPI- otherwise negative. No fever, chills, CP, SOB, nausea, vomiting, rash   Physical Examination: Vitals:   08/12/16 1149  BP: 124/72  Pulse: 96  Temp: 98.9 F (37.2 C)   Vitals:   08/12/16 1149  Weight: 142 lb 6.4 oz (64.6 kg)  Height: 4\' 11"  (1.499 m)   Body mass index is 28.76 kg/m. Ideal Body Weight: Weight in (lb) to have BMI = 25: 123.5  GEN: WDWN, NAD, Non-toxic, A & O x 3, mild overweight, looks well HEENT: Atraumatic, Normocephalic. Neck supple. No masses, No LAD. Ears and Nose: No external deformity. CV: RRR, No M/G/R. No JVD. No thrill. No extra heart sounds. PULM: CTA B, no wheezes, crackles, rhonchi. No retractions. No resp. distress. No accessory muscle use. ABD: S, NT, ND, +BS. No rebound. No HSM. EXTR: No c/c/e NEURO Normal gait.  PSYCH: Normally interactive. Conversant. Not depressed or anxious appearing.  Calm demeanor. Sometimes tearful when discussing her health    Assessment and Plan: Dysthymia - Plan: buPROPion (WELLBUTRIN SR) 150 MG 12 hr tablet  Muscle pain - Plan: tiZANidine (ZANAFLEX) 4 MG tablet  Mixed connective  tissue disease (HCC)  Screening for breast cancer - Plan: MM SCREENING BREAST TOMO BILATERAL  Here today as a new patient She is sleeping poorly due to pain- will rx zanaflex for her to use as needed.  She tried this before and it worked well for her Encouraged a mammogram asap She has depression/ dysthymia likely due to her current illness.  Will start her on wellbutrin in hopes of also improving her concentration and energy level  See patient instructions for more details.  Signed Lamar Blinks, MD

## 2016-08-12 ENCOUNTER — Ambulatory Visit (INDEPENDENT_AMBULATORY_CARE_PROVIDER_SITE_OTHER): Payer: Medicare Other | Admitting: Family Medicine

## 2016-08-12 ENCOUNTER — Telehealth: Payer: Self-pay | Admitting: Rheumatology

## 2016-08-12 VITALS — BP 124/72 | HR 96 | Temp 98.9°F | Ht 59.0 in | Wt 142.4 lb

## 2016-08-12 DIAGNOSIS — Z1239 Encounter for other screening for malignant neoplasm of breast: Secondary | ICD-10-CM

## 2016-08-12 DIAGNOSIS — F341 Dysthymic disorder: Secondary | ICD-10-CM | POA: Diagnosis not present

## 2016-08-12 DIAGNOSIS — M351 Other overlap syndromes: Secondary | ICD-10-CM | POA: Diagnosis not present

## 2016-08-12 DIAGNOSIS — Z1231 Encounter for screening mammogram for malignant neoplasm of breast: Secondary | ICD-10-CM

## 2016-08-12 DIAGNOSIS — M791 Myalgia, unspecified site: Secondary | ICD-10-CM

## 2016-08-12 MED ORDER — BUPROPION HCL ER (SR) 150 MG PO TB12: 150.0000 mg | ORAL_TABLET | Freq: Two times a day (BID) | ORAL | 5 refills | Status: DC

## 2016-08-12 MED ORDER — TIZANIDINE HCL 4 MG PO TABS: 4.0000 mg | ORAL_TABLET | Freq: Every day | ORAL | 2 refills | Status: DC

## 2016-08-12 NOTE — Telephone Encounter (Signed)
Attempted to contact the office and left message for patient to call the office.  

## 2016-08-12 NOTE — Telephone Encounter (Signed)
Patient states she is on Prednisone 7.5 mg. Patient states she is fatigue and states she is extremely tired and doesn't feel like doing anything. Patient states she is having pain in her right arm, shoulder , and wrist. Patient states she is on Imuran and is taking it as prescribed.

## 2016-08-12 NOTE — Telephone Encounter (Signed)
She may increase prednisone to 10 mg by mouth daily. Please reschedule her next available follow-up visit to evaluate. If she has shortness of breath she should see Dr. Marchelle Gearingamaswamy.

## 2016-08-12 NOTE — Telephone Encounter (Signed)
Patient left message wanting to know what she can use while going thru withdraws from Prednisone. Patient having a lot of pain. Does she use Tylenol. Please advise.

## 2016-08-12 NOTE — Telephone Encounter (Signed)
Patient returning Andrea's call. °

## 2016-08-12 NOTE — Patient Instructions (Signed)
It was nice to see you today!  I am sorry that you are going through this trouble with your health.    Please use the zanaflex at bedtime as needed for pain- this will make you a bit sleepy  We will try the wellbutrin for mood and energy.  Take it once a day for 3 days, then increase to twice a day  Please send me a mychart message in about one month with an update, and let's plan to visit in 2-3 months Please stop by imaging (ground floor) on your way out and ask about a mammogram!

## 2016-08-12 NOTE — Telephone Encounter (Signed)
See previous phone note.  

## 2016-08-13 NOTE — Telephone Encounter (Signed)
Patient has been advised she may increase her prednisone to 10 mg by mouth daily. She has been scheduled for a follow up visit on 08/20/16 at 10:30 am with Dr. Corliss Skainseveshwar. Patient advised if she is having any shortness of breath to make an appointment with Dr. Marchelle Gearingamaswamy.

## 2016-08-14 NOTE — Progress Notes (Signed)
Office Visit Note  Patient: Madeline Harper             Date of Birth: 1950-04-24           MRN: [id number]             PCP: Darreld Mclean, MD Referring: Darreld Mclean, MD Visit Date: 08/20/2016 Occupation: @GUAROCC @    Subjective:  Fatigue and joint pain.   History of Present Illness: Madeline Harper is a 66 y.o. female with history of mixed connective tissue disease. She states she's been on tapering dose of prednisone. She's been experiencing increased pain and discomfort on the lower dose of prednisone. She's currently taking prednisone 7.5 mg by mouth daily. She's been experiencing pain in her bilateral shoulders, bilateral wrist joints and bilateral hands. She denies any joint swelling. She's been experiencing a lot of fatigue. She has some shortness of breath.  Activities of Daily Living:  Patient reports morning stiffness for 4 hours.   Patient Denies nocturnal pain.  Difficulty dressing/grooming: Denies Difficulty climbing stairs: Denies Difficulty getting out of chair: Denies Difficulty using hands for taps, buttons, cutlery, and/or writing: Reports   Review of Systems  Constitutional: Positive for fatigue. Negative for night sweats, weight gain, weight loss and weakness.  HENT: Negative for mouth sores, trouble swallowing, trouble swallowing, mouth dryness and nose dryness.   Eyes: Negative for pain, redness, visual disturbance and dryness.  Respiratory: Positive for shortness of breath. Negative for cough.   Cardiovascular: Negative for chest pain, palpitations, hypertension, irregular heartbeat and swelling in legs/feet.  Gastrointestinal: Negative for blood in stool, constipation and diarrhea.  Endocrine: Negative for increased urination.  Genitourinary: Negative for vaginal dryness.  Musculoskeletal: Positive for arthralgias, joint pain, myalgias, morning stiffness and myalgias. Negative for joint swelling, muscle weakness and muscle tenderness.  Skin:  Positive for color change. Negative for rash, hair loss, skin tightness, ulcers and sensitivity to sunlight.  Allergic/Immunologic: Negative for susceptible to infections.  Neurological: Negative for dizziness, memory loss and night sweats.  Hematological: Negative for swollen glands.  Psychiatric/Behavioral: Positive for sleep disturbance. Negative for depressed mood. The patient is not nervous/anxious.     PMFS History:  Patient Active Problem List   Diagnosis Date Noted  . Mixed connective tissue disease ([place]) 07/12/2016  . UIP (usual interstitial pneumonitis) ([place]) - ILD due to [facility] 06/26/2016  . Interstitial lung disease ([place]) 06/04/2016  . High risk medication use 05/28/2016  . Raynaud's disease without gangrene 05/28/2016  . Autoimmune disease ([place]) 03/27/2016  . Bilateral foot pain 03/27/2016  . Trigger index finger of right hand 03/27/2016  . Elevated sedimentation rate 02/28/2016  . Positive ANA (antinuclear antibody) 02/27/2016  . Bilateral shoulder pain 02/27/2016  . Status post left foot surgery 06/28/2015  . Tailor's bunion of left foot 06/28/2015  . Bone spur of foot 06/14/2015    Past Medical History:  Diagnosis Date  . Bone spur of foot   . Cataract     Family History  Problem Relation Age of Onset  . Diabetes Mother   . Heart disease Mother   . Kidney failure Mother   . Cancer Mother        Uterine Cancer  . Diabetes Maternal Grandmother   . Kidney failure Maternal Grandmother   . Stroke Sister   . Gout Maternal Grandfather    Past Surgical History:  Procedure Laterality Date  . [place]  . [place]  History   Social History Narrative   Lives with husband. Pt works in [employer]     Objective: Vital Signs: BP 130/74   Pulse 78   Wt 144 lb (65.3 kg)   BMI 29.08 kg/m    Physical Exam  Constitutional: She is oriented to person, place, and time. She appears well-developed and well-nourished.  HENT:  Head:  Normocephalic and atraumatic.  Eyes: Conjunctivae and EOM are normal.  Neck: Normal range of motion.  Cardiovascular: Normal rate, regular rhythm, normal heart sounds and intact distal pulses.   Pulmonary/Chest: Effort normal and breath sounds normal.  Abdominal: Soft. Bowel sounds are normal.  Lymphadenopathy:    She has no cervical adenopathy.  Neurological: She is alert and oriented to person, place, and time.  Skin: Skin is warm and dry. Capillary refill takes less than 2 seconds.  Psychiatric: She has a normal mood and affect. Her behavior is normal.  Nursing note and vitals reviewed.    Musculoskeletal Exam:   CDAI Exam: No CDAI exam completed.    Investigation: No additional findings.  CBC    Component Value Date/Time   WBC 5.4 08/19/2016 1202   RBC 4.02 08/19/2016 1202   HGB 10.9 (L) 08/19/2016 1202   HCT 34.5 (L) 08/19/2016 1202   PLT 409 (H) 08/19/2016 1202   MCV 85.8 08/19/2016 1202   MCH 27.1 08/19/2016 1202   MCHC 31.6 (L) 08/19/2016 1202   RDW 14.4 08/19/2016 1202   LYMPHSABS 756 (L) 08/19/2016 1202   MONOABS 432 08/19/2016 1202   EOSABS 54 08/19/2016 1202   BASOSABS 0 08/19/2016 1202   CBC    Component Value Date/Time   WBC 5.4 08/19/2016 1202   RBC 4.02 08/19/2016 1202   HGB 10.9 (L) 08/19/2016 1202   HCT 34.5 (L) 08/19/2016 1202   PLT 409 (H) 08/19/2016 1202   MCV 85.8 08/19/2016 1202   MCH 27.1 08/19/2016 1202   MCHC 31.6 (L) 08/19/2016 1202   RDW 14.4 08/19/2016 1202   LYMPHSABS 756 (L) 08/19/2016 1202   MONOABS 432 08/19/2016 1202   EOSABS 54 08/19/2016 1202   BASOSABS 0 08/19/2016 1202   CMP     Component Value Date/Time   NA 140 08/19/2016 1202   K 4.5 08/19/2016 1202   CL 104 08/19/2016 1202   CO2 27 08/19/2016 1202   GLUCOSE 94 08/19/2016 1202   BUN 16 08/19/2016 1202   CREATININE 0.94 08/19/2016 1202   CALCIUM 9.2 08/19/2016 1202   PROT 7.2 08/19/2016 1202   ALBUMIN 3.4 (L) 08/19/2016 1202   AST 19 08/19/2016 1202   ALT 18  08/19/2016 1202   ALKPHOS 56 08/19/2016 1202   BILITOT 0.3 08/19/2016 1202   GFRNONAA 64 08/19/2016 1202   GFRAA 74 08/19/2016 1202   CMP     Component Value Date/Time   NA 140 08/19/2016 1202   K 4.5 08/19/2016 1202   CL 104 08/19/2016 1202   CO2 27 08/19/2016 1202   GLUCOSE 94 08/19/2016 1202   BUN 16 08/19/2016 1202   CREATININE 0.94 08/19/2016 1202   CALCIUM 9.2 08/19/2016 1202   PROT 7.2 08/19/2016 1202   ALBUMIN 3.4 (L) 08/19/2016 1202   AST 19 08/19/2016 1202   ALT 18 08/19/2016 1202   ALKPHOS 56 08/19/2016 1202   BILITOT 0.3 08/19/2016 1202   GFRNONAA 64 08/19/2016 1202   GFRAA 74 08/19/2016 1202    Imaging: No results found.  Speciality Comments: No specialty comments available.  Procedures:  No procedures performed Allergies: Lyrica [pregabalin]   Assessment / Plan:     Visit Diagnoses: Mixed connective tissue disease ([place]) - Positive ANA, double-stranded DNA, RNP, SSA, elevated ESR, hypocomplementemia,She is on Imuran 50 mg by mouth daily. Her labs are better. We discussed increasing Imuran to 75 mg by mouth daily. We will check labs 2 weeks after the increased dose and then every 2 months to monitor for drug toxicity. If she tolerates 75 mg we may be able to increase her Imuran 200 mg by mouth daily.  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 Followed by Dr. Chase Caller  High risk medication use - Imuran 50 mg po qd, prednisone7.5 mg po qd. Her WBC count is better. She has anemia. I've advised her to take some iron supplement and increase intake of green leafy vegetables.  Raynaud's disease without gangrene: [place] is not very active currently  Trigger index finger of right hand: Doing better  Bilateral foot pain: She has no synovitis on examination. Proper fitting shoes were discussed.  Status post left foot surgery  Chronic pain of both shoulders  Elevated sedimentation rate    Orders: No orders of the defined types were  placed in this encounter.  No orders of the defined types were placed in this encounter.   Face-to-face time spent with patient was 35 minutes. 50% of time was spent in counseling and coordination of care.  Follow-Up Instructions: Return in about 2 months (around 10/20/2016) for MCTD, UIP.   Bo Merino, MD  Note - This record has been created using Editor, commissioning.  Chart creation errors have been sought, but may not always  have been located. Such creation errors do not reflect on  the standard of medical care.

## 2016-08-19 ENCOUNTER — Other Ambulatory Visit: Payer: Self-pay

## 2016-08-19 DIAGNOSIS — Z79899 Other long term (current) drug therapy: Secondary | ICD-10-CM

## 2016-08-19 LAB — CBC WITH DIFFERENTIAL/PLATELET
BASOS PCT: 0 %
Basophils Absolute: 0 cells/uL (ref 0–200)
EOS ABS: 54 {cells}/uL (ref 15–500)
Eosinophils Relative: 1 %
HCT: 34.5 % — ABNORMAL LOW (ref 35.0–45.0)
Hemoglobin: 10.9 g/dL — ABNORMAL LOW (ref 11.7–15.5)
Lymphocytes Relative: 14 %
Lymphs Abs: 756 cells/uL — ABNORMAL LOW (ref 850–3900)
MCH: 27.1 pg (ref 27.0–33.0)
MCHC: 31.6 g/dL — ABNORMAL LOW (ref 32.0–36.0)
MCV: 85.8 fL (ref 80.0–100.0)
MONOS PCT: 8 %
MPV: 10 fL (ref 7.5–12.5)
Monocytes Absolute: 432 cells/uL (ref 200–950)
Neutro Abs: 4158 cells/uL (ref 1500–7800)
Neutrophils Relative %: 77 %
Platelets: 409 10*3/uL — ABNORMAL HIGH (ref 140–400)
RBC: 4.02 MIL/uL (ref 3.80–5.10)
RDW: 14.4 % (ref 11.0–15.0)
WBC: 5.4 10*3/uL (ref 3.8–10.8)

## 2016-08-20 ENCOUNTER — Ambulatory Visit (INDEPENDENT_AMBULATORY_CARE_PROVIDER_SITE_OTHER): Payer: Medicare Other | Admitting: Rheumatology

## 2016-08-20 ENCOUNTER — Encounter: Payer: Self-pay | Admitting: Rheumatology

## 2016-08-20 VITALS — BP 130/74 | HR 78 | Wt 144.0 lb

## 2016-08-20 DIAGNOSIS — M79671 Pain in right foot: Secondary | ICD-10-CM

## 2016-08-20 DIAGNOSIS — I7300 Raynaud's syndrome without gangrene: Secondary | ICD-10-CM | POA: Diagnosis not present

## 2016-08-20 DIAGNOSIS — M25512 Pain in left shoulder: Secondary | ICD-10-CM | POA: Diagnosis not present

## 2016-08-20 DIAGNOSIS — Z9889 Other specified postprocedural states: Secondary | ICD-10-CM | POA: Diagnosis not present

## 2016-08-20 DIAGNOSIS — M79672 Pain in left foot: Secondary | ICD-10-CM | POA: Diagnosis not present

## 2016-08-20 DIAGNOSIS — R700 Elevated erythrocyte sedimentation rate: Secondary | ICD-10-CM | POA: Diagnosis not present

## 2016-08-20 DIAGNOSIS — Z79899 Other long term (current) drug therapy: Secondary | ICD-10-CM | POA: Diagnosis not present

## 2016-08-20 DIAGNOSIS — M25511 Pain in right shoulder: Secondary | ICD-10-CM

## 2016-08-20 DIAGNOSIS — J84112 Idiopathic pulmonary fibrosis: Secondary | ICD-10-CM | POA: Diagnosis not present

## 2016-08-20 DIAGNOSIS — M351 Other overlap syndromes: Secondary | ICD-10-CM

## 2016-08-20 DIAGNOSIS — M65321 Trigger finger, right index finger: Secondary | ICD-10-CM | POA: Diagnosis not present

## 2016-08-20 DIAGNOSIS — G8929 Other chronic pain: Secondary | ICD-10-CM

## 2016-08-20 LAB — COMPLETE METABOLIC PANEL WITH GFR
ALT: 18 U/L (ref 6–29)
AST: 19 U/L (ref 10–35)
Albumin: 3.4 g/dL — ABNORMAL LOW (ref 3.6–5.1)
Alkaline Phosphatase: 56 U/L (ref 33–130)
BUN: 16 mg/dL (ref 7–25)
CHLORIDE: 104 mmol/L (ref 98–110)
CO2: 27 mmol/L (ref 20–31)
Calcium: 9.2 mg/dL (ref 8.6–10.4)
Creat: 0.94 mg/dL (ref 0.50–0.99)
GFR, EST AFRICAN AMERICAN: 74 mL/min (ref >=60)
GFR, EST NON AFRICAN AMERICAN: 64 mL/min (ref >=60)
Glucose, Bld: 94 mg/dL (ref 65–99)
Potassium: 4.5 mmol/L (ref 3.5–5.3)
Sodium: 140 mmol/L (ref 135–146)
Total Bilirubin: 0.3 mg/dL (ref 0.2–1.2)
Total Protein: 7.2 g/dL (ref 6.1–8.1)

## 2016-08-20 NOTE — Progress Notes (Signed)
Rheumatology Medication Review by a Pharmacist Does the patient feel that his/her medications are working for him/her?  Yes Has the patient been experiencing any side effects to the medications prescribed?  No Does the patient have any problems obtaining medications?  No  Issues to address at subsequent visits: None   Pharmacist comments:  Madeline Harper is a 66 yo F who presents for follow up of mixed connective tissue disease.  She is currently taking azathioprine 50 mg daily and prednisone 7.5 mg daily.  She had standing labs yesterday.  Plan today was to increase patient's azathioprine to 75 mg daily for two weeks then get labs.  I reviewed these instructions with the patient who voiced understanding.  She denies any further questions or concerns regarding her medications at this time.   Madeline Harper, Pharm.D., BCPS, CPP Clinical Pharmacist Pager: [phone] Phone: [phone] 08/20/2016 11:36 AM

## 2016-08-20 NOTE — Patient Instructions (Addendum)
Increase your Imuran to 75 mg (1 and 1/2 tablet) by mouth daily.  Get lab work again in 2 weeks.  If your labs are stable, we will consider increasing dose to 100 mg daily.  If your dose is increased again, we will want labs 2 weeks after increasing the dose.    Continue to taper prednisone as instructed.  Call us in case she would have increased swelling in your joints.    Standing Labs We placed an order today for your standing lab work.    Please come back and get your standing labs in 2 weeks x2  We have open lab Monday through Friday from 8:30-11:30 AM and 1:30-4 PM at the office of Dr. Arbutus PedShaili Lawren Sexson/Naitik Panwala, PA.   The office is located at [address], [address], [place], [place] [address] No appointment is necessary.   Labs are drawn by [facility].  You may receive a bill from [facility] for your lab work.

## 2016-08-28 ENCOUNTER — Encounter: Payer: Self-pay | Admitting: Family Medicine

## 2016-09-04 ENCOUNTER — Other Ambulatory Visit: Payer: Self-pay

## 2016-09-04 ENCOUNTER — Encounter: Payer: Self-pay | Admitting: Rheumatology

## 2016-09-04 ENCOUNTER — Ambulatory Visit (INDEPENDENT_AMBULATORY_CARE_PROVIDER_SITE_OTHER): Payer: Medicare Other | Admitting: Rheumatology

## 2016-09-04 VITALS — BP 140/84 | HR 88 | Resp 18 | Wt 142.0 lb

## 2016-09-04 DIAGNOSIS — I7300 Raynaud's syndrome without gangrene: Secondary | ICD-10-CM

## 2016-09-04 DIAGNOSIS — J84112 Idiopathic pulmonary fibrosis: Secondary | ICD-10-CM | POA: Diagnosis not present

## 2016-09-04 DIAGNOSIS — M79642 Pain in left hand: Secondary | ICD-10-CM

## 2016-09-04 DIAGNOSIS — M65321 Trigger finger, right index finger: Secondary | ICD-10-CM

## 2016-09-04 DIAGNOSIS — M79641 Pain in right hand: Secondary | ICD-10-CM

## 2016-09-04 DIAGNOSIS — G8929 Other chronic pain: Secondary | ICD-10-CM

## 2016-09-04 DIAGNOSIS — Z79899 Other long term (current) drug therapy: Secondary | ICD-10-CM | POA: Diagnosis not present

## 2016-09-04 DIAGNOSIS — M25512 Pain in left shoulder: Secondary | ICD-10-CM

## 2016-09-04 DIAGNOSIS — M79672 Pain in left foot: Secondary | ICD-10-CM | POA: Diagnosis not present

## 2016-09-04 DIAGNOSIS — M25511 Pain in right shoulder: Secondary | ICD-10-CM

## 2016-09-04 DIAGNOSIS — M79671 Pain in right foot: Secondary | ICD-10-CM

## 2016-09-04 DIAGNOSIS — M351 Other overlap syndromes: Secondary | ICD-10-CM | POA: Diagnosis not present

## 2016-09-04 LAB — COMPLETE METABOLIC PANEL WITH GFR
ALT: 12 U/L (ref 6–29)
AST: 16 U/L (ref 10–35)
Albumin: 3.6 g/dL (ref 3.6–5.1)
Alkaline Phosphatase: 66 U/L (ref 33–130)
BUN: 12 mg/dL (ref 7–25)
CALCIUM: 9.3 mg/dL (ref 8.6–10.4)
CHLORIDE: 106 mmol/L (ref 98–110)
CO2: 21 mmol/L (ref 20–31)
Creat: 0.89 mg/dL (ref 0.50–0.99)
GFR, EST AFRICAN AMERICAN: 79 mL/min (ref >=60)
GFR, Est Non African American: 68 mL/min (ref >=60)
Glucose, Bld: 108 mg/dL — ABNORMAL HIGH (ref 65–99)
POTASSIUM: 4.4 mmol/L (ref 3.5–5.3)
Sodium: 138 mmol/L (ref 135–146)
Total Bilirubin: 0.4 mg/dL (ref 0.2–1.2)
Total Protein: 7.7 g/dL (ref 6.1–8.1)

## 2016-09-04 LAB — CBC WITH DIFFERENTIAL/PLATELET
Basophils Absolute: 0 cells/uL (ref 0–200)
Basophils Relative: 0 %
Eosinophils Absolute: 40 cells/uL (ref 15–500)
Eosinophils Relative: 1 %
HEMATOCRIT: 34 % — AB (ref 35.0–45.0)
Hemoglobin: 10.6 g/dL — ABNORMAL LOW (ref 11.7–15.5)
LYMPHS PCT: 11 %
Lymphs Abs: 440 cells/uL — ABNORMAL LOW (ref 850–3900)
MCH: 27.2 pg (ref 27.0–33.0)
MCHC: 31.2 g/dL — AB (ref 32.0–36.0)
MCV: 87.4 fL (ref 80.0–100.0)
MONO ABS: 360 {cells}/uL (ref 200–950)
MPV: 10.9 fL (ref 7.5–12.5)
Monocytes Relative: 9 %
NEUTROS PCT: 79 %
Neutro Abs: 3160 cells/uL (ref 1500–7800)
Platelets: 269 10*3/uL (ref 140–400)
RBC: 3.89 MIL/uL (ref 3.80–5.10)
RDW: 15.2 % — AB (ref 11.0–15.0)
WBC: 4 10*3/uL (ref 3.8–10.8)

## 2016-09-04 MED ORDER — LIDOCAINE HCL 1 % IJ SOLN
1.5000 mL | INTRAMUSCULAR | Status: AC | PRN
  Administered 2016-09-04: 1.5 mL

## 2016-09-04 MED ORDER — TRIAMCINOLONE ACETONIDE 40 MG/ML IJ SUSP
40.0000 mg | INTRAMUSCULAR | Status: AC | PRN
  Administered 2016-09-04: 40 mg via INTRA_ARTICULAR

## 2016-09-04 NOTE — Progress Notes (Signed)
Office Visit Note  Patient: Madeline Harper             Date of Birth: 1950-05-12           MRN: [id number]             PCP: Darreld Mclean, MD Referring: Darreld Mclean, MD Visit Date: 09/04/2016 Occupation: _0 @    Subjective:  Muscle Pain (complains of pain can not complete ADLs ); Joint Pain; and Medication Management (not taking PLQ is taking the imuran/ does not want to increase prednisone )   History of Present Illness: Madeline Harper is a 66 y.o. female with history of autoimmune disease and interstitial lung disease. She was started on Imuran on 07/18/2016 at 50 mg by mouth daily. Her Imuran was increased to 75 mg to use daily and Aug 20 2016. She's been on prednisone taper and has decreased prednisone to 5 mg by mouth daily on 08/30/2016. The plan was to continue prednisone 5 mg for a month and then taper by 2.5 mg every month. She's been experiencing increased joint pain and morning stiffness especially in her hands and shoulder joints. She did also experiencing some neck pain. She denies any joint swelling. She reports shortness of breath only on exertion but not at rest.  Activities of Daily Living:  Patient reports morning stiffness for 1 hour.   Patient Reports nocturnal pain.  Difficulty dressing/grooming: reports Difficulty climbing stairs: Denies Difficulty getting out of chair: Denies Difficulty using hands for taps, buttons, cutlery, and/or writing: Reports   Review of Systems  Constitutional: Positive for fatigue. Negative for night sweats, weight gain, weight loss and weakness.  HENT: Negative for mouth sores, trouble swallowing, trouble swallowing, mouth dryness and nose dryness.   Eyes: Negative for pain, redness, visual disturbance and dryness.  Respiratory: Positive for shortness of breath. Negative for cough and difficulty breathing.        On exertion  Cardiovascular: Negative for chest pain, palpitations, hypertension, irregular heartbeat and  swelling in legs/feet.  Gastrointestinal: Negative for blood in stool, constipation and diarrhea.  Endocrine: Negative for increased urination.  Genitourinary: Negative for vaginal dryness.  Musculoskeletal: Positive for arthralgias, joint pain and morning stiffness. Negative for joint swelling, myalgias, muscle weakness, muscle tenderness and myalgias.  Skin: Negative for color change, rash, hair loss, skin tightness, ulcers and sensitivity to sunlight.  Allergic/Immunologic: Negative for susceptible to infections.  Neurological: Negative for dizziness, memory loss and night sweats.  Hematological: Negative for swollen glands.  Psychiatric/Behavioral: Positive for sleep disturbance. Negative for depressed mood. The patient is not nervous/anxious.     PMFS History:  Patient Active Problem List   Diagnosis Date Noted  . Mixed connective tissue disease ([place]) 07/12/2016  . 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 High risk medication use 05/28/2016  . Raynaud's disease without gangrene 05/28/2016  . Bilateral foot pain 03/27/2016  . Trigger index finger of right hand 03/27/2016  . Elevated sedimentation rate 02/28/2016  . Positive ANA (antinuclear antibody) 02/27/2016  . Bilateral shoulder pain 02/27/2016  . Status post left foot surgery 06/28/2015  . Bone spur of foot 06/14/2015    Past Medical History:  Diagnosis Date  . Bone spur of foot   . Cataract     Family History  Problem Relation Age of Onset  . Diabetes Mother   . Heart disease Mother   . Kidney failure Mother   . Cancer Mother  Uterine Cancer  . Diabetes Maternal Grandmother   . Kidney failure Maternal Grandmother   . Stroke Sister   . Gout Maternal Grandfather    Past Surgical History:  Procedure Laterality Date  . [place]  . TUBAL LIGATION  1970   Social History   Social History Narrative   Lives with husband. Pt works in Press photographer      Objective: Vital Signs: BP 140/84   Pulse 88   Resp 18   Wt 142 lb (64.4 kg)   BMI 28.68 kg/m    Physical Exam  Constitutional: She is oriented to person, place, and time. She appears well-developed and well-nourished.  HENT:  Head: Normocephalic and atraumatic.  Eyes: Conjunctivae and EOM are normal.  Neck: Normal range of motion.  Cardiovascular: Normal rate, regular rhythm, normal heart sounds and intact distal pulses.   Pulmonary/Chest: Effort normal. She has rales.  Bilateral lung bases  Abdominal: Soft. Bowel sounds are normal.  Lymphadenopathy:    She has no cervical adenopathy.  Neurological: She is alert and oriented to person, place, and time.  Skin: Skin is warm and dry. Capillary refill takes less than 2 seconds.  Psychiatric: She has a normal mood and affect. Her behavior is normal.  Nursing note and vitals reviewed.    Musculoskeletal Exam: C-spine and thoracic lumbar spine good range of motion. She had shoulder joint abduction limited to 110 bilaterally which discomfort. Elbow joints wrist joints MCPs PIPs DIPs are good range of motion. Did not appreciate any synovitis on examination. Hip joints knee joints ankles MTPs PIPs DIPs with good range of motion with no synovitis.  CDAI Exam: No CDAI exam completed.    Investigation: No additional findings.   Imaging: No results found.  Speciality Comments: No specialty comments available.    Procedures:  Large Joint Inj Date/Time: 09/04/2016 3:39 PM Performed by: Bo Merino Authorized by: Bo Merino   Consent Given by:  Patient Site marked: the procedure site was marked   Timeout: prior to procedure the correct patient, procedure, and site was verified   Indications:  Pain Location:  Shoulder Site:  R glenohumeral Prep: patient was prepped and draped in usual sterile fashion   Needle Size:  27 G Needle Length:  1.5 inches Approach:  Posterior Ultrasound Guidance: No    Fluoroscopic Guidance: No   Arthrogram: No   Medications:  40 mg triamcinolone acetonide 40 MG/ML; 1.5 mL lidocaine 1 % Aspiration Attempted: Yes   Aspirate amount (mL):  0 Patient tolerance:  Patient tolerated the procedure well with no immediate complications   Allergies: Lyrica [pregabalin]   Assessment / Plan:     Visit Diagnoses: Mixed connective tissue disease ([place]) - Positive ANA, double-stranded DNA, RNP, SSA, elevated ESR, hypocomplementemia. She is a started Imuran and is on 75 mg daily now. She's on prednisone taper at 5 mg by mouth daily. I plan to increase his her Imuran further after her labs within normal limits.  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 Labs are stable. We will check her labs again today.  Chronic pain of both shoulders: She has severe pain in her bilateral shoulders currently. She was having difficulty lifting her shoulders up. After informed consent was obtained in different treatment options were discussed right shoulder joint was injected with cortisone  as described above. She tolerated the procedure well and had full range of motion after the injection. As her blood pressure was elevated I decided to inject her left shoulder at later date. She has an appointment next month.  Bilateral foot pain: Proper fitting shoes were discussed.  Trigger index finger of right hand: Doing better  Pain in both hands : She did not have any synovitis on examination   Orders: No orders of the defined types were placed in this encounter.  No orders of the defined types were placed in this encounter.   Face-to-face time spent with patient was 30 minutes. 50% of time was spent in counseling and coordination of care.  Follow-Up Instructions: Return for [place].   Bo Merino, MD  Note -  This record has been created using Editor, commissioning.  Chart creation errors have been sought, but may not always  have been located. Such creation errors do not reflect on  the standard of medical care.

## 2016-09-04 NOTE — Progress Notes (Signed)
Rheumatology Medication Review by a Pharmacist Does the patient feel that his/her medications are working for him/her?  No Has the patient been experiencing any side effects to the medications prescribed?  No Does the patient have any problems obtaining medications?  No  Issues to address at subsequent visits: None   Pharmacist comments:  Madeline Harper is a pleasant 66 yo F who presents for follow up.  She is currently taking azathioprine and prednisone.  She increased her azathioprine dose to 75 mg daily on 08/20/16 and she decreased prednisone dose to 5 mg daily on 08/30/16.  She reports joint pain and stiffness in her hands, shoulders, neck.  Patient had standing labs today.  The plan was to increase azathioprine to 100 mg if labs are stable.  She will be due for standing labs again two weeks if dose is increased.    Madeline Harper, Pharm.D., BCPS, CPP Clinical Pharmacist Pager: [phone] Phone: [phone] 09/04/2016 3:15 PM

## 2016-09-05 NOTE — Progress Notes (Signed)
Labs are stable.

## 2016-09-10 NOTE — Progress Notes (Signed)
Madeline Harper [address] [place], [place] [address] [phone]  Date:  09/11/2016   Name:  Madeline Harper   DOB:  May 01, 1950   MRN:  [id number]  PCP:  Madeline Cables, MD    Chief Complaint: Pain Managment (Pt here to discuss pain management. )   History of Present Illness:h  Madeline Harper is a 66 y.o. very pleasant female patient who presents with the following:  Seen by myself one month ago- A and P as follows Here today as a new patient She is sleeping poorly due to pain- will rx zanaflex for her to use as needed.  She tried this before and it worked well for her Encouraged a mammogram asap She has depression/ dysthymia likely due to her current illness.  Will start her on wellbutrin in hopes of also improving her concentration and energy level  She is a pt of Dr. Eustace Harper; she has a mixed connective tissue disorder and associated UIP (lung disorder)  She is on prednisone 5 mg a day and Imuran at this time and is tolerating these drugs ok  She is taking zanaflex at bedtime but does not want to use it during the day due to sedation.  The zanaflex does help her to sleep  She was using a lot of tylenol due to her pain  She has not wanted to go up on her prednisone dose due to SE She did get a shot of cortisone in her right shoulder per Dr. Algis Harper a couple of weeks ago which really did help with her sometimes severe shoulder pain Her pain in this regard is much better but not all together gone.  It times she will be pretty uncomfortable.  She cannot use NSAIDs due to her prednisone, but does not want to start on narcotics if she can help it She is interested in finding out more about pain control options Also has noted that her BP was slightly high on last couple of checks No history of HTN  BP Readings from Last 3 Encounters:  09/11/16 (!) 150/82  09/04/16 140/84  08/20/16 130/74     Patient Active Problem List    Diagnosis Date Noted  . Mixed connective tissue disease (HCC) 07/12/2016  . UIP (usual interstitial pneumonitis) (HCC) - ILD due to [facility] 06/26/2016  . High risk medication use 05/28/2016  . Raynaud's disease without gangrene 05/28/2016  . Bilateral foot pain 03/27/2016  . Trigger index finger of right hand 03/27/2016  . Elevated sedimentation rate 02/28/2016  . Positive ANA (antinuclear antibody) 02/27/2016  . Bilateral shoulder pain 02/27/2016  . Status post left foot surgery 06/28/2015  . Bone spur of foot 06/14/2015    Past Medical History:  Diagnosis Date  . Bone spur of foot   . Cataract     Past Surgical History:  Procedure Laterality Date  . KIDNEY STONE SURGERY  1972  . TUBAL LIGATION  1970    Social History  Substance Use Topics  . Smoking status: Former Smoker    Packs/day: 2.00    Years: 15.00    Types: Cigarettes    Quit date: 04/08/1980  . Smokeless tobacco: Never Used  . Alcohol use 0.0 oz/week     Comment: Occ    Family History  Problem Relation Age of Onset  . Diabetes Mother   . Heart disease Mother   . Kidney failure Mother   . Cancer Mother  Uterine Cancer  . Diabetes Maternal Grandmother   . Kidney failure Maternal Grandmother   . Stroke Sister   . Gout Maternal Grandfather     Allergies  Allergen Reactions  . Lyrica [Pregabalin] Swelling    Pedal edema     Medication list has been reviewed and updated.  Current Outpatient Prescriptions on File Prior to Visit  Medication Sig Dispense Refill  . acetaminophen (TYLENOL 8 HOUR ARTHRITIS PAIN) 650 MG CR tablet Take 650 mg by mouth every 8 (eight) hours as needed for pain.    Marland Kitchen. azaTHIOprine (IMURAN) 50 MG tablet Take 1 tablet (50 mg total) by mouth daily. For two weeks then increase dose to 100 mg daily if labs are normal 60 tablet 2  . buPROPion (WELLBUTRIN SR) 150 MG 12 hr tablet Take 1 tablet (150 mg total) by mouth 2 (two) times daily. 60 tablet 5  . calcium carbonate (CALCIUM  600) 600 MG TABS tablet Take 600 mg by mouth daily.    Marland Kitchen. estradiol (ESTRACE VAGINAL) 0.1 MG/GM vaginal cream as needed.     . Ferrous Sulfate (IRON) 325 (65 Fe) MG TABS Take by mouth.    . Multiple Vitamins-Minerals (MULTI ADULT GUMMIES PO) Take 1 tablet by mouth daily.    . predniSONE (DELTASONE) 5 MG tablet Take 2 tablets (10 mg total) by mouth daily with breakfast. (Patient taking differently: Take 7.5 mg by mouth daily with breakfast. ) 180 tablet 0  . tiZANidine (ZANAFLEX) 4 MG tablet Take 1 tablet (4 mg total) by mouth at bedtime. Use as needed for pain and stiffness 30 tablet 2   No current facility-administered medications on file prior to visit.     Review of Systems:  As per HPI- otherwise negative. No fever or chills No rash No nausea, vomiting or diarrhea  Physical Examination: Vitals:   09/11/16 1234  BP: (!) 150/82  Pulse: 95  Temp: 98.4 F (36.9 C)   Vitals:   09/11/16 1234  Weight: 139 lb 9.6 oz (63.3 kg)  Height: 4\' 11"  (1.499 m)   Body mass index is 28.2 kg/m. Ideal Body Weight: Weight in (lb) to have BMI = 25: 123.5  GEN: WDWN, NAD, Non-toxic, A & O x 3, looks well and younger than age HEENT: Atraumatic, Normocephalic. Neck supple. No masses, No LAD. Ears and Nose: No external deformity. CV: RRR, No M/G/R. No JVD. No thrill. No extra heart sounds. PULM: CTA B, no wheezes, crackles in bilateral bases, rhonchi. No retractions. No resp. distress. No accessory muscle use. ABD: S, NT, ND, +BS. No rebound. No HSM. EXTR: No c/c/e NEURO Normal gait.  PSYCH: Normally interactive. Conversant. Not depressed or anxious appearing.  Calm demeanor.    Assessment and Plan: Muscle pain  Mixed connective tissue disease (HCC)  UIP (usual interstitial pneumonitis) (HCC)  Elevated BP without diagnosis of hypertension  Here today to discuss a few concerns She is using tylenol with some success but it does not totally resolve her pain. zanaflex helps at night but  makes her too sleepy for daytime use.  Discussed tramadol- for the time being she declines but she will think about this  Discussed her MCTD and UIP- she is frustrated by this illness but remains hopeful that this will get better  Her BP is a slightly high today; at this point will not start medication but will continue to monitor this. She has an appt to see me in August for a follow-up  Signed Abbe AmsterdamJessica Copland, MD

## 2016-09-11 ENCOUNTER — Ambulatory Visit (INDEPENDENT_AMBULATORY_CARE_PROVIDER_SITE_OTHER): Payer: Medicare Other | Admitting: Family Medicine

## 2016-09-11 VITALS — BP 145/80 | HR 95 | Temp 98.4°F | Ht 59.0 in | Wt 139.6 lb

## 2016-09-11 DIAGNOSIS — M791 Myalgia, unspecified site: Secondary | ICD-10-CM

## 2016-09-11 DIAGNOSIS — M351 Other overlap syndromes: Secondary | ICD-10-CM | POA: Diagnosis not present

## 2016-09-11 DIAGNOSIS — R030 Elevated blood-pressure reading, without diagnosis of hypertension: Secondary | ICD-10-CM

## 2016-09-11 DIAGNOSIS — J84112 Idiopathic pulmonary fibrosis: Secondary | ICD-10-CM | POA: Diagnosis not present

## 2016-09-11 NOTE — Patient Instructions (Addendum)
It was a pleasure to see you today!  Please let me know if you need anything else for pain control such as tramadol Continue to use tylenol as needed; however be sure that you do not take more than is directed on the package.  Overdose of tylenol can be dangerous  Please come and see me in 3-4 months so we can check on your blood pressure

## 2016-09-16 DIAGNOSIS — H524 Presbyopia: Secondary | ICD-10-CM | POA: Diagnosis not present

## 2016-09-16 DIAGNOSIS — D869 Sarcoidosis, unspecified: Secondary | ICD-10-CM | POA: Diagnosis not present

## 2016-09-16 DIAGNOSIS — H52223 Regular astigmatism, bilateral: Secondary | ICD-10-CM | POA: Diagnosis not present

## 2016-09-16 DIAGNOSIS — Z79899 Other long term (current) drug therapy: Secondary | ICD-10-CM | POA: Diagnosis not present

## 2016-09-16 DIAGNOSIS — H5203 Hypermetropia, bilateral: Secondary | ICD-10-CM | POA: Diagnosis not present

## 2016-09-16 DIAGNOSIS — Z961 Presence of intraocular lens: Secondary | ICD-10-CM | POA: Diagnosis not present

## 2016-09-18 ENCOUNTER — Other Ambulatory Visit: Payer: Self-pay

## 2016-09-18 DIAGNOSIS — Z79899 Other long term (current) drug therapy: Secondary | ICD-10-CM | POA: Diagnosis not present

## 2016-09-18 LAB — COMPLETE METABOLIC PANEL WITH GFR
ALBUMIN: 3.2 g/dL — AB (ref 3.6–5.1)
ALK PHOS: 60 U/L (ref 33–130)
ALT: 12 U/L (ref 6–29)
AST: 12 U/L (ref 10–35)
BILIRUBIN TOTAL: 0.4 mg/dL (ref 0.2–1.2)
BUN: 19 mg/dL (ref 7–25)
CO2: 26 mmol/L (ref 20–31)
Calcium: 8.9 mg/dL (ref 8.6–10.4)
Chloride: 107 mmol/L (ref 98–110)
Creat: 0.96 mg/dL (ref 0.50–0.99)
GFR, Est African American: 72 mL/min (ref >=60)
GFR, Est Non African American: 62 mL/min (ref >=60)
GLUCOSE: 95 mg/dL (ref 65–99)
Potassium: 3.8 mmol/L (ref 3.5–5.3)
SODIUM: 140 mmol/L (ref 135–146)
TOTAL PROTEIN: 6.8 g/dL (ref 6.1–8.1)

## 2016-09-18 LAB — CBC WITH DIFFERENTIAL/PLATELET
BASOS ABS: 0 {cells}/uL (ref 0–200)
Basophils Relative: 0 %
EOS PCT: 1 %
Eosinophils Absolute: 43 cells/uL (ref 15–500)
HCT: 35.2 % (ref 35.0–45.0)
HEMOGLOBIN: 11.1 g/dL — AB (ref 11.7–15.5)
LYMPHS ABS: 860 {cells}/uL (ref 850–3900)
Lymphocytes Relative: 20 %
MCH: 27.8 pg (ref 27.0–33.0)
MCHC: 31.5 g/dL — ABNORMAL LOW (ref 32.0–36.0)
MCV: 88.2 fL (ref 80.0–100.0)
MPV: 9.8 fL (ref 7.5–12.5)
Monocytes Absolute: 387 cells/uL (ref 200–950)
Monocytes Relative: 9 %
NEUTROS ABS: 3010 {cells}/uL (ref 1500–7800)
Neutrophils Relative %: 70 %
Platelets: 283 10*3/uL (ref 140–400)
RBC: 3.99 MIL/uL (ref 3.80–5.10)
RDW: 15.4 % — ABNORMAL HIGH (ref 11.0–15.0)
WBC: 4.3 10*3/uL (ref 3.8–10.8)

## 2016-09-19 NOTE — Progress Notes (Signed)
stable °

## 2016-09-22 ENCOUNTER — Other Ambulatory Visit: Payer: Self-pay | Admitting: Rheumatology

## 2016-09-23 NOTE — Telephone Encounter (Signed)
ok 

## 2016-09-23 NOTE — Telephone Encounter (Signed)
Last Visit: 09/04/16 Next Visit: 09/26/16 Labs: 09/18/16 stable PLQ Eye Exam: 07/12/16 WNL  Okay to refill PLQ?

## 2016-09-24 NOTE — Progress Notes (Signed)
Office Visit Note  Patient: Madeline Harper             Date of Birth: 1950-04-19           MRN: [id number]             PCP: Darreld Mclean, MD Referring: Delilah Shan, MD Visit Date: 09/26/2016 Occupation: '@GUAROCC'$ @    Subjective:  Medication management.   History of Present Illness: Madeline Harper is a 66 y.o. female with history of mixed connective tissue disease and UIP. She states she did really well after the shoulder joint injection. Her pain is much improved. She still has been taking 1 Tylenol a day. She's been experiencing some discomfort in her left foot and the left calf on the lateral aspect. She has achiness off and on in that area. She denies any numbness in that.. She's not having any increased shortness of breath. Raynauds is active off-and-on cold weather. Her right second trigger finger is improved.  Activities of Daily Living:  Patient reports morning stiffness for 30 minutes.   Patient Denies nocturnal pain.  Difficulty dressing/grooming: Denies Difficulty climbing stairs: Denies Difficulty getting out of chair: Denies Difficulty using hands for taps, buttons, cutlery, and/or writing: Denies   Review of Systems  Constitutional: Positive for fatigue. Negative for night sweats, weight gain, weight loss and weakness.  HENT: Negative for mouth sores, trouble swallowing, trouble swallowing, mouth dryness and nose dryness.   Eyes: Negative for pain, redness, visual disturbance and dryness.  Respiratory: Negative for cough, shortness of breath and difficulty breathing.   Cardiovascular: Negative for chest pain, palpitations, hypertension, irregular heartbeat and swelling in legs/feet.  Gastrointestinal: Negative for blood in stool, constipation and diarrhea.  Endocrine: Negative for increased urination.  Genitourinary: Negative for vaginal dryness.  Musculoskeletal: Positive for arthralgias, joint pain and morning stiffness. Negative for joint swelling, myalgias,  muscle weakness, muscle tenderness and myalgias.  Skin: Positive for color change. Negative for rash, hair loss, skin tightness, ulcers and sensitivity to sunlight.  Allergic/Immunologic: Negative for susceptible to infections.  Neurological: Negative for dizziness, memory loss and night sweats.  Hematological: Negative for swollen glands.  Psychiatric/Behavioral: Positive for sleep disturbance. Negative for depressed mood. The patient is not nervous/anxious.     PMFS History:  Patient Active Problem List   Diagnosis Date Noted  . Mixed connective tissue disease ([place]) 07/12/2016  . 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 High risk medication use 05/28/2016  . Raynaud's disease without gangrene 05/28/2016  . Bilateral foot pain 03/27/2016  . Trigger index finger of right hand 03/27/2016  . Elevated sedimentation rate 02/28/2016  . Positive ANA (antinuclear antibody) 02/27/2016  . Bilateral shoulder pain 02/27/2016  . Status post left foot surgery 06/28/2015  . Bone spur of foot 06/14/2015    Past Medical History:  Diagnosis Date  . Bone spur of foot   . Cataract     Family History  Problem Relation Age of Onset  . Diabetes Mother   . Heart disease Mother   . Kidney failure Mother   . Cancer Mother        Uterine Cancer  . Diabetes Maternal Grandmother   . Kidney failure Maternal Grandmother   . Stroke Sister   . Gout Maternal Grandfather    Past Surgical History:  Procedure Laterality Date  . [place]  . [place]   Social History   Social History  Narrative   Lives with husband. Pt works in Press photographer     Objective: Vital Signs: BP (!) 158/99   Pulse (!) 108   Resp 14   Ht 5' (1.524 m)   Wt 148 lb (67.1 kg)   BMI 28.90 kg/m    Physical Exam  Constitutional: She is oriented to person, place, and time. She appears well-developed and well-nourished.  HENT:  Head: Normocephalic and atraumatic.    Eyes: Conjunctivae and EOM are normal.  Neck: Normal range of motion.  Cardiovascular: Normal rate, regular rhythm, normal heart sounds and intact distal pulses.   Pulmonary/Chest: Effort normal. She has rales.  Bilateral lung bases  Abdominal: Soft. Bowel sounds are normal.  Lymphadenopathy:    She has no cervical adenopathy.  Neurological: She is alert and oriented to person, place, and time.  Skin: Skin is warm and dry. Capillary refill takes less than 2 seconds.  Psychiatric: She has a normal mood and affect. Her behavior is normal.  Nursing note and vitals reviewed.    Musculoskeletal Exam: C-spine and thoracic lumbar spine good range of motion. She is some discomfort range of motion of her right shoulder. Although it was full range of motion. Although joints afford range of motion with no synovitis on examination. She has some tenderness over her left plantar fascia.  CDAI Exam: No CDAI exam completed.    Investigation: Findings:  PLQ Eye Exam: 07/12/16 WNL   CBC Latest Ref Rng & Units 09/18/2016 09/04/2016 08/19/2016  WBC 3.8 - 10.8 K/uL 4.3 4.0 5.4  Hemoglobin 11.7 - 15.5 g/dL 11.1(L) 10.6(L) 10.9(L)  Hematocrit 35.0 - 45.0 % 35.2 34.0(L) 34.5(L)  Platelets 140 - 400 K/uL 283 269 409(H)    CMP Latest Ref Rng & Units 09/18/2016 09/04/2016 08/19/2016  Glucose 65 - 99 mg/dL 95 108(H) 94  BUN 7 - 25 mg/dL 19 12 16   Creatinine 0.50 - 0.99 mg/dL 0.96 0.89 0.94  Sodium 135 - 146 mmol/L 140 138 140  Potassium 3.5 - 5.3 mmol/L 3.8 4.4 4.5  Chloride 98 - 110 mmol/L 107 106 104  CO2 20 - 31 mmol/L 26 21 27   Calcium 8.6 - 10.4 mg/dL 8.9 9.3 9.2  Total Protein 6.1 - 8.1 g/dL 6.8 7.7 7.2  Total Bilirubin 0.2 - 1.2 mg/dL 0.4 0.4 0.3  Alkaline Phos 33 - 130 U/L 60 66 56  AST 10 - 35 U/L 12 16 19   ALT 6 - 29 U/L 12 12 18     Imaging: No results found.  Speciality Comments: No specialty comments available.    Procedures:  No procedures performed Allergies: Lyrica [pregabalin]    Assessment / Plan:     Visit Diagnoses: Mixed connective tissue disease ([place]) - Positive ANA, double-stranded DNA, RNP, SSA, elevated ESR, hypocomplementemia.She stayed quite well on Imuran and has been tolerating it well. Her labs have been stable.  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 Have advised her to follow-up with Dr. Chase Caller.  High risk medication use - Imuran 100 mg po qd and Prednisone 5 mg po qd, she will continue to taper prednisone as planned. ANA, ENA,C3,C4,UA,ESR with next lab  Raynaud's disease without gangrene: Proper clothing and keeping for temperature warm was discussed.  Trigger index finger of right hand: Improved  Chronic pain of both shoulders: She had good response to her right shoulder joint cortisone injection.  Bilateral foot pain: She is symptoms of left plantar fasciitis. Proper fitting shoes and stretching signs were discussed.  Status post left foot surgery    Orders: No orders of the defined types were placed in this encounter.  No orders of the defined types were placed in this encounter.   Face-to-face time spent with patient was 30 minutes. 50% of time was spent in counseling and coordination of care.  Follow-Up Instructions: Return in about 4 months (around 01/26/2017) for MCTD, UIP.   Bo Merino, MD  Note - This record has been created using Editor, commissioning.  Chart creation errors have been sought, but may not always  have been located. Such creation errors do not reflect on  the standard of medical care.

## 2016-09-26 ENCOUNTER — Encounter: Payer: Self-pay | Admitting: Rheumatology

## 2016-09-26 ENCOUNTER — Ambulatory Visit (INDEPENDENT_AMBULATORY_CARE_PROVIDER_SITE_OTHER): Payer: Medicare Other | Admitting: Rheumatology

## 2016-09-26 VITALS — BP 158/99 | HR 108 | Resp 14 | Ht 60.0 in | Wt 148.0 lb

## 2016-09-26 DIAGNOSIS — M65321 Trigger finger, right index finger: Secondary | ICD-10-CM

## 2016-09-26 DIAGNOSIS — Z79899 Other long term (current) drug therapy: Secondary | ICD-10-CM | POA: Diagnosis not present

## 2016-09-26 DIAGNOSIS — M79671 Pain in right foot: Secondary | ICD-10-CM

## 2016-09-26 DIAGNOSIS — M25512 Pain in left shoulder: Secondary | ICD-10-CM

## 2016-09-26 DIAGNOSIS — M79672 Pain in left foot: Secondary | ICD-10-CM

## 2016-09-26 DIAGNOSIS — M351 Other overlap syndromes: Secondary | ICD-10-CM | POA: Diagnosis not present

## 2016-09-26 DIAGNOSIS — I7300 Raynaud's syndrome without gangrene: Secondary | ICD-10-CM | POA: Diagnosis not present

## 2016-09-26 DIAGNOSIS — Z9889 Other specified postprocedural states: Secondary | ICD-10-CM | POA: Diagnosis not present

## 2016-09-26 DIAGNOSIS — G8929 Other chronic pain: Secondary | ICD-10-CM | POA: Diagnosis not present

## 2016-09-26 DIAGNOSIS — M25511 Pain in right shoulder: Secondary | ICD-10-CM | POA: Diagnosis not present

## 2016-09-26 DIAGNOSIS — J84112 Idiopathic pulmonary fibrosis: Secondary | ICD-10-CM | POA: Diagnosis not present

## 2016-09-26 NOTE — Patient Instructions (Signed)
Standing Labs We placed an order today for your standing lab work.    Please come back and get your standing labs in August and every 2 months  We have open lab Monday through Friday from 8:30-11:30 AM and 1:30-4 PM at the office of Dr. Arbutus PedShaili Bryant Saye/Naitik Panwala, PA.   The office is located at [address], [address], [place], [place] [id number] No appointment is necessary.   Labs are drawn by [facility].  You may receive a bill from [facility] for your lab work. If you have any questions regarding directions or hours of operation,  please call [phone].

## 2016-10-02 ENCOUNTER — Telehealth: Payer: Self-pay | Admitting: Rheumatology

## 2016-10-02 NOTE — Telephone Encounter (Signed)
Patient has questions about why she is being put back on Plaquenil. She doesn't want to start it until she talks to someone. Please advise.

## 2016-10-03 NOTE — Telephone Encounter (Signed)
Patient wanted to make sure you received her message.

## 2016-10-03 NOTE — Telephone Encounter (Signed)
Patient advised we do not plan on having her restart the PLQ. Patient verbalized understanding.

## 2016-10-14 DIAGNOSIS — Z79899 Other long term (current) drug therapy: Secondary | ICD-10-CM | POA: Diagnosis not present

## 2016-10-14 DIAGNOSIS — H5202 Hypermetropia, left eye: Secondary | ICD-10-CM | POA: Diagnosis not present

## 2016-10-14 DIAGNOSIS — Z961 Presence of intraocular lens: Secondary | ICD-10-CM | POA: Diagnosis not present

## 2016-10-14 DIAGNOSIS — H524 Presbyopia: Secondary | ICD-10-CM | POA: Diagnosis not present

## 2016-10-14 DIAGNOSIS — H43821 Vitreomacular adhesion, right eye: Secondary | ICD-10-CM | POA: Diagnosis not present

## 2016-10-14 DIAGNOSIS — H52223 Regular astigmatism, bilateral: Secondary | ICD-10-CM | POA: Diagnosis not present

## 2016-10-18 ENCOUNTER — Telehealth: Payer: Self-pay | Admitting: Rheumatology

## 2016-10-18 NOTE — Telephone Encounter (Signed)
Patient states she is having pain in her neck, shoulders, and fingers. Patient states she is having trouble sleeping due to the pain. Patient states she is taking tylenol with no relief. She states this has been going on for a few weeks.   Patient has scheduled an appointment for 10/24/16. Patient wants to know if there is something we can do before then.

## 2016-10-18 NOTE — Telephone Encounter (Signed)
Patient left a message this morning wanting to get into Dr. Fatima Sangereveshwar's schedule.  She stated that she is having a lot of pain in her joints and is having difficulty moving around during the day and also having trouble sleeping at night.

## 2016-10-18 NOTE — Telephone Encounter (Signed)
I called patient. Her pain is mostly in her neck and shoulders. She's not taking her muscle relaxer. I have advised her to add tizanidine again at bedtime. We will see how she does on it until her follow-up visit.

## 2016-10-22 NOTE — Progress Notes (Signed)
Office Visit Note  Patient: Madeline Harper             Date of Birth: [birth year]           MRN: [id number]             PCP: Darreld Mclean, MD Referring: Darreld Mclean, MD Visit Date: 10/24/2016 Occupation: _0 @    Subjective:  Joint Pain (Flare, bil shoulder pain, neck pain, difficulty sleeping)   History of Present Illness: Madeline Harper is a 66 y.o. female with history of mixed connective tissue disease and UIP. She returns today after her last visit on 09/26/2016. She states she's been having a lot of stiffness in her neck and bilateral shoulders. She has difficulty lifting her arms. She also has nocturnal pain in her shoulders. She has some stiffness in her hands. She had right shoulder joint injection on 09/04/2016 which according to patient lasted only a few weeks.  Activities of Daily Living:  Patient reports morning stiffness for 2 hours.   Patient Reports nocturnal pain.  Difficulty dressing/grooming: Denies Difficulty climbing stairs: Reports Difficulty getting out of chair: Reports Difficulty using hands for taps, buttons, cutlery, and/or writing: Denies   Review of Systems  Constitutional: Positive for fatigue. Negative for night sweats, weight gain, weight loss and weakness.  HENT: Negative for mouth sores, trouble swallowing, trouble swallowing, mouth dryness and nose dryness.   Eyes: Negative for pain, redness, visual disturbance and dryness.  Respiratory: Negative for cough, shortness of breath and difficulty breathing.   Cardiovascular: Negative for chest pain, palpitations, hypertension, irregular heartbeat and swelling in legs/feet.  Gastrointestinal: Negative for blood in stool, constipation and diarrhea.  Endocrine: Negative for increased urination.  Genitourinary: Negative for vaginal dryness.  Musculoskeletal: Positive for arthralgias, joint pain and morning stiffness. Negative for joint swelling, myalgias, muscle weakness, muscle tenderness and  myalgias.  Skin: Positive for color change. Negative for rash, hair loss, skin tightness, ulcers and sensitivity to sunlight.  Allergic/Immunologic: Negative for susceptible to infections.  Neurological: Negative for dizziness, memory loss and night sweats.  Hematological: Negative for swollen glands.  Psychiatric/Behavioral: Positive for sleep disturbance. Negative for depressed mood. The patient is not nervous/anxious.     PMFS History:  Patient Active Problem List   Diagnosis Date Noted  . Mixed connective tissue disease ([place]) 07/12/2016  . 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 High risk medication use 05/28/2016  . Raynaud's disease without gangrene 05/28/2016  . Bilateral foot pain 03/27/2016  . Trigger index finger of right hand 03/27/2016  . Elevated sedimentation rate 02/28/2016  . Positive ANA (antinuclear antibody) 02/27/2016  . Bilateral shoulder pain 02/27/2016  . Status post left foot surgery 06/28/2015  . Bone spur of foot 06/14/2015    Past Medical History:  Diagnosis Date  . Autoimmune disease ([place])   . Bone spur of foot   . Cataract     Family History  Problem Relation Age of Onset  . Diabetes Mother   . Heart disease Mother   . Kidney failure Mother   . Cancer Mother        Uterine Cancer  . Diabetes Maternal Grandmother   . Kidney failure Maternal Grandmother   . Stroke Sister   . Gout Maternal Grandfather    Past Surgical History:  Procedure Laterality Date  . CATARACT EXTRACTION, BILATERAL    . FOOT SURGERY    . [place]  . TUBAL  LIGATION  1970   Social History   Social History Narrative   Lives with husband. Pt works in [employer]     Objective: Vital Signs: BP (!) 151/77 (BP Location: Left Arm, Patient Position: Sitting, Cuff Size: Normal)   Pulse 92   Resp 15   Ht _0  (1.499 m)   Wt 144 lb (65.3 kg)   BMI 29.08 kg/m    Physical Exam  Constitutional: She is oriented to person,  place, and time. She appears well-developed and well-nourished.  HENT:  Head: Normocephalic and atraumatic.  Eyes: Conjunctivae and EOM are normal.  Neck: Normal range of motion.  Cardiovascular: Normal rate, regular rhythm, normal heart sounds and intact distal pulses.   Pulmonary/Chest: Effort normal. She has rales.  Bilateral lung bases  Abdominal: Soft. Bowel sounds are normal.  Lymphadenopathy:    She has no cervical adenopathy.  Neurological: She is alert and oriented to person, place, and time.  Skin: Skin is warm and dry. Capillary refill takes less than 2 seconds.  Psychiatric: She has a normal mood and affect. Her behavior is normal.  Nursing note and vitals reviewed.    Musculoskeletal Exam: She is painful  range of motion of her C-spine, thoracic and lumbar spine good range of motion. She has discomfort range of motion of bilateral shoulders. Elbows joints wrist joint MCPs PIPs DIPs with good range of motion. Hip joints knee joints ankles MTPs PIPs DIPs with good range of motion. No synovitis was noted in her joints.  CDAI Exam: No CDAI exam completed.    Investigation: No additional findings. CBC Latest Ref Rng & Units 09/18/2016 09/04/2016 08/19/2016  WBC 3.8 - 10.8 K/uL 4.3 4.0 5.4  Hemoglobin 11.7 - 15.5 g/dL 11.1(L) 10.6(L) 10.9(L)  Hematocrit 35.0 - 45.0 % 35.2 34.0(L) 34.5(L)  Platelets 140 - 400 K/uL 283 269 409(H)    CMP Latest Ref Rng & Units 09/18/2016 09/04/2016 08/19/2016  Glucose 65 - 99 mg/dL 95 108(H) 94  BUN 7 - 25 mg/dL _1 Creatinine 0.50 - 0.99 mg/dL 0.96 0.89 0.94  Sodium 135 - 146 mmol/L 140 138 140  Potassium 3.5 - 5.3 mmol/L 3.8 4.4 4.5  Chloride 98 - 110 mmol/L 107 106 104  CO2 20 - 31 mmol/L _2 Calcium 8.6 - 10.4 mg/dL 8.9 9.3 9.2  Total Protein 6.1 - 8.1 g/dL 6.8 7.7 7.2  Total Bilirubin 0.2 - 1.2 mg/dL 0.4 0.4 0.3  Alkaline Phos 33 - 130 U/L 60 66 56  AST 10 - 35 U/L _3 ALT 6 - 29 U/L _4 Imaging: Xr  Cervical Spine 2 Or 3 Views  Result Date: 10/24/2016 Multilevel spondylosis was noted. Significant joint space narrowing between C4-5, C5-6, C6-7. Anterior spurring was noted.  Xr Shoulder Left  Result Date: 10/24/2016 No glenohumeral joint space narrowing was noted. No acromioclavicular joint space narrowing was noted. No chondrocalcinosis was noted.  Xr Shoulder Right  Result Date: 10/24/2016 No glenohumeral joint space narrowing was noted. No acromioclavicular joint space narrowing was noted. No chondrocalcinosis was noted   Speciality Comments: No specialty comments available.    Procedures:  Large Joint Inj Date/Time: 10/24/2016 10:53 AM Performed by: Bo Merino Authorized by: Bo Merino   Consent Given by:  Patient Site marked: the procedure site was marked   Timeout: prior to procedure the correct patient, procedure, and site was verified   Indications:  Pain Location:  Shoulder Site:  L glenohumeral Prep: patient was prepped and draped in usual sterile fashion   Needle Size:  27 G Needle Length:  1.5 inches Approach:  Posterior Ultrasound Guidance: No   Fluoroscopic Guidance: No   Arthrogram: No   Medications:  40 mg triamcinolone acetonide 40 MG/ML; 1 mL lidocaine 2 % Aspiration Attempted: Yes   Aspirate amount (mL):  0 Patient tolerance:  Patient tolerated the procedure well with no immediate complications   Allergies: Lyrica [pregabalin]   Assessment / Plan:     Visit Diagnoses: Mixed connective tissue disease  Positive ANA, double-stranded DNA, RNP, SSA, elevated ESR, hypocomplementemia  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 She has appointment coming up with Dr. Chase Caller in September. She continues to have some crackles in her lung bases. I would like his input regarding her medication management.  High risk medication use - Imuran100 mg po qd , Prednisone2.5 mg po qd. She'll stay on current dose of her  medications.  Neck pain - she has discomfort range of motion of her C-spine. A handout on C-spine exercises was given. She's been having nocturnal pain and radiculopathy to the right side. I'll schedule MRI of her C-spine. Plan: XR Cervical Spine 2 or 3 views: Showed significant disc disease with the narrowing between C4-5, C5-6, and C6-7.  Chronic pain of both shoulders -handout on shoulder joint exercises was given. Plan: XR Shoulder Right, XR Shoulder Left. Her x-rays were unremarkable. She had short-term effect from the right shoulder joint injection. She requests left shoulder joint injection. I will proceed with injection as described above. I also also encouraged her to get physical therapy for shoulder joints.  Raynaud's disease without gangrene   Bilateral foot pain history of left foot surgery     Orders: Orders Placed This Encounter  Procedures  . Large Joint Injection/Arthrocentesis  . XR Shoulder Right  . XR Shoulder Left  . XR Cervical Spine 2 or 3 views  . MR CERVICAL SPINE WO CONTRAST  . Ambulatory referral to Physical Therapy   No orders of the defined types were placed in this encounter.   Face-to-face time spent with patient was 30 minutes. 50% of time was spent in counseling and coordination of care.  Follow-Up Instructions: Return for MCTD.   Bo Merino, MD  Note - This record has been created using Editor, commissioning.  Chart creation errors have been sought, but may not always  have been located. Such creation errors do not reflect on  the standard of medical care.

## 2016-10-24 ENCOUNTER — Ambulatory Visit (INDEPENDENT_AMBULATORY_CARE_PROVIDER_SITE_OTHER): Payer: Medicare Other | Admitting: Rheumatology

## 2016-10-24 ENCOUNTER — Ambulatory Visit (INDEPENDENT_AMBULATORY_CARE_PROVIDER_SITE_OTHER): Payer: Medicare Other

## 2016-10-24 ENCOUNTER — Ambulatory Visit (INDEPENDENT_AMBULATORY_CARE_PROVIDER_SITE_OTHER): Payer: Self-pay

## 2016-10-24 ENCOUNTER — Other Ambulatory Visit: Payer: Self-pay | Admitting: Rheumatology

## 2016-10-24 ENCOUNTER — Encounter: Payer: Self-pay | Admitting: Rheumatology

## 2016-10-24 VITALS — BP 151/77 | HR 92 | Resp 15 | Ht 59.0 in | Wt 144.0 lb

## 2016-10-24 DIAGNOSIS — I7300 Raynaud's syndrome without gangrene: Secondary | ICD-10-CM

## 2016-10-24 DIAGNOSIS — Z79899 Other long term (current) drug therapy: Secondary | ICD-10-CM | POA: Diagnosis not present

## 2016-10-24 DIAGNOSIS — M25511 Pain in right shoulder: Secondary | ICD-10-CM

## 2016-10-24 DIAGNOSIS — J84112 Idiopathic pulmonary fibrosis: Secondary | ICD-10-CM

## 2016-10-24 DIAGNOSIS — M25512 Pain in left shoulder: Secondary | ICD-10-CM

## 2016-10-24 DIAGNOSIS — M542 Cervicalgia: Secondary | ICD-10-CM

## 2016-10-24 DIAGNOSIS — M351 Other overlap syndromes: Secondary | ICD-10-CM | POA: Diagnosis not present

## 2016-10-24 DIAGNOSIS — G8929 Other chronic pain: Secondary | ICD-10-CM

## 2016-10-24 DIAGNOSIS — M79671 Pain in right foot: Secondary | ICD-10-CM | POA: Diagnosis not present

## 2016-10-24 DIAGNOSIS — M79672 Pain in left foot: Secondary | ICD-10-CM

## 2016-10-24 MED ORDER — LIDOCAINE HCL 2 % IJ SOLN
1.0000 mL | INTRAMUSCULAR | Status: AC | PRN
  Administered 2016-10-24: 1 mL

## 2016-10-24 MED ORDER — TRIAMCINOLONE ACETONIDE 40 MG/ML IJ SUSP
40.0000 mg | INTRAMUSCULAR | Status: AC | PRN
  Administered 2016-10-24: 40 mg via INTRA_ARTICULAR

## 2016-10-24 NOTE — Telephone Encounter (Signed)
Last Visit: 09/26/16 Next Visit: 01/27/17 Labs: 09/18/16 Stable   Okay to refill per Dr. Corliss Skainseveshwar

## 2016-10-24 NOTE — Patient Instructions (Signed)
Shoulder Exercises Ask your health care provider which exercises are safe for you. Do exercises exactly as told by your health care provider and adjust them as directed. It is normal to feel mild stretching, pulling, tightness, or discomfort as you do these exercises, but you should stop right away if you feel sudden pain or your pain gets worse.Do not begin these exercises until told by your health care provider. RANGE OF MOTION EXERCISES These exercises warm up your muscles and joints and improve the movement and flexibility of your shoulder. These exercises also help to relieve pain, numbness, and tingling. These exercises involve stretching your injured shoulder directly. Exercise A: Pendulum  1. Stand near a wall or a surface that you can hold onto for balance. 2. Bend at the waist and let your left / right arm hang straight down. Use your other arm to support you. Keep your back straight and do not lock your knees. 3. Relax your left / right arm and shoulder muscles, and move your hips and your trunk so your left / right arm swings freely. Your arm should swing because of the motion of your body, not because you are using your arm or shoulder muscles. 4. Keep moving your body so your arm swings in the following directions, as told by your health care provider: ? Side to side. ? Forward and backward. ? In clockwise and counterclockwise circles. 5. Continue each motion for __________ seconds, or for as long as told by your health care provider. 6. Slowly return to the starting position. Repeat __________ times. Complete this exercise __________ times a day. Exercise B:Flexion, Standing  1. Stand and hold a broomstick, a cane, or a similar object. Place your hands a little more than shoulder-width apart on the object. Your left / right hand should be palm-up, and your other hand should be palm-down. 2. Keep your elbow straight and keep your shoulder muscles relaxed. Push the stick down with  your healthy arm to raise your left / right arm in front of your body, and then over your head until you feel a stretch in your shoulder. ? Avoid shrugging your shoulder while you raise your arm. Keep your shoulder blade tucked down toward the middle of your back. 3. Hold for __________ seconds. 4. Slowly return to the starting position. Repeat __________ times. Complete this exercise __________ times a day. Exercise C: Abduction, Standing 1. Stand and hold a broomstick, a cane, or a similar object. Place your hands a little more than shoulder-width apart on the object. Your left / right hand should be palm-up, and your other hand should be palm-down. 2. While keeping your elbow straight and your shoulder muscles relaxed, push the stick across your body toward your left / right side. Raise your left / right arm to the side of your body and then over your head until you feel a stretch in your shoulder. ? Do not raise your arm above shoulder height, unless your health care provider tells you to do that. ? Avoid shrugging your shoulder while you raise your arm. Keep your shoulder blade tucked down toward the middle of your back. 3. Hold for __________ seconds. 4. Slowly return to the starting position. Repeat __________ times. Complete this exercise __________ times a day. Exercise D:Internal Rotation  1. Place your left / right hand behind your back, palm-up. 2. Use your other hand to dangle an exercise band, a towel, or a similar object over your shoulder. Grasp the band with   your left / right hand so you are holding onto both ends. 3. Gently pull up on the band until you feel a stretch in the front of your left / right shoulder. ? Avoid shrugging your shoulder while you raise your arm. Keep your shoulder blade tucked down toward the middle of your back. 4. Hold for __________ seconds. 5. Release the stretch by letting go of the band and lowering your hands. Repeat __________ times. Complete  this exercise __________ times a day. STRETCHING EXERCISES These exercises warm up your muscles and joints and improve the movement and flexibility of your shoulder. These exercises also help to relieve pain, numbness, and tingling. These exercises are done using your healthy shoulder to help stretch the muscles of your injured shoulder. Exercise E: Corner Stretch (External Rotation and Abduction)  1. Stand in a doorway with one of your feet slightly in front of the other. This is called a staggered stance. If you cannot reach your forearms to the door frame, stand facing a corner of a room. 2. Choose one of the following positions as told by your health care provider: ? Place your hands and forearms on the door frame above your head. ? Place your hands and forearms on the door frame at the height of your head. ? Place your hands on the door frame at the height of your elbows. 3. Slowly move your weight onto your front foot until you feel a stretch across your chest and in the front of your shoulders. Keep your head and chest upright and keep your abdominal muscles tight. 4. Hold for __________ seconds. 5. To release the stretch, shift your weight to your back foot. Repeat __________ times. Complete this stretch __________ times a day. Exercise F:Extension, Standing 1. Stand and hold a broomstick, a cane, or a similar object behind your back. ? Your hands should be a little wider than shoulder-width apart. ? Your palms should face away from your back. 2. Keeping your elbows straight and keeping your shoulder muscles relaxed, move the stick away from your body until you feel a stretch in your shoulder. ? Avoid shrugging your shoulders while you move the stick. Keep your shoulder blade tucked down toward the middle of your back. 3. Hold for __________ seconds. 4. Slowly return to the starting position. Repeat __________ times. Complete this exercise __________ times a day. STRENGTHENING  EXERCISES These exercises build strength and endurance in your shoulder. Endurance is the ability to use your muscles for a long time, even after they get tired. Exercise G:External Rotation  1. Sit in a stable chair without armrests. 2. Secure an exercise band at elbow height on your left / right side. 3. Place a soft object, such as a folded towel or a small pillow, between your left / right upper arm and your body to move your elbow a few inches away (about 10 cm) from your side. 4. Hold the end of the band so it is tight and there is no slack. 5. Keeping your elbow pressed against the soft object, move your left / right forearm out, away from your abdomen. Keep your body steady so only your forearm moves. 6. Hold for __________ seconds. 7. Slowly return to the starting position. Repeat __________ times. Complete this exercise __________ times a day. Exercise H:Shoulder Abduction  1. Sit in a stable chair without armrests, or stand. 2. Hold a __________ weight in your left / right hand, or hold an exercise band with both hands.   3. Start with your arms straight down and your left / right palm facing in, toward your body. 4. Slowly lift your left / right hand out to your side. Do not lift your hand above shoulder height unless your health care provider tells you that this is safe. ? Keep your arms straight. ? Avoid shrugging your shoulder while you do this movement. Keep your shoulder blade tucked down toward the middle of your back. 5. Hold for __________ seconds. 6. Slowly lower your arm, and return to the starting position. Repeat __________ times. Complete this exercise __________ times a day. Exercise I:Shoulder Extension 1. Sit in a stable chair without armrests, or stand. 2. Secure an exercise band to a stable object in front of you where it is at shoulder height. 3. Hold one end of the exercise band in each hand. Your palms should face each other. 4. Straighten your elbows and  lift your hands up to shoulder height. 5. Step back, away from the secured end of the exercise band, until the band is tight and there is no slack. 6. Squeeze your shoulder blades together as you pull your hands down to the sides of your thighs. Stop when your hands are straight down by your sides. Do not let your hands go behind your body. 7. Hold for __________ seconds. 8. Slowly return to the starting position. Repeat __________ times. Complete this exercise __________ times a day. Exercise J:Standing Shoulder Row 1. Sit in a stable chair without armrests, or stand. 2. Secure an exercise band to a stable object in front of you so it is at waist height. 3. Hold one end of the exercise band in each hand. Your palms should be in a thumbs-up position. 4. Bend each of your elbows to an "L" shape (about 90 degrees) and keep your upper arms at your sides. 5. Step back until the band is tight and there is no slack. 6. Slowly pull your elbows back behind you. 7. Hold for __________ seconds. 8. Slowly return to the starting position. Repeat __________ times. Complete this exercise __________ times a day. Exercise K:Shoulder Press-Ups  1. Sit in a stable chair that has armrests. Sit upright, with your feet flat on the floor. 2. Put your hands on the armrests so your elbows are bent and your fingers are pointing forward. Your hands should be about even with the sides of your body. 3. Push down on the armrests and use your arms to lift yourself off of the chair. Straighten your elbows and lift yourself up as much as you comfortably can. ? Move your shoulder blades down, and avoid letting your shoulders move up toward your ears. ? Keep your feet on the ground. As you get stronger, your feet should support less of your body weight as you lift yourself up. 4. Hold for __________ seconds. 5. Slowly lower yourself back into the chair. Repeat __________ times. Complete this exercise __________ times a  day. Exercise L: Wall Push-Ups  1. Stand so you are facing a stable wall. Your feet should be about one arm-length away from the wall. 2. Lean forward and place your palms on the wall at shoulder height. 3. Keep your feet flat on the floor as you bend your elbows and lean forward toward the wall. 4. Hold for __________ seconds. 5. Straighten your elbows to push yourself back to the starting position. Repeat __________ times. Complete this exercise __________ times a day. This information is not intended to replace advice   given to you by your health care provider. Make sure you discuss any questions you have with your health care provider. Document Released: 02/06/2005 Document Revised: 12/18/2015 Document Reviewed: 12/04/2014 [employer] Interactive Patient Education  2018 [employer] Cervical Strain and Sprain Rehab Ask your health care provider which exercises are safe for you. Do exercises exactly as told by your health care provider and adjust them as directed. It is normal to feel mild stretching, pulling, tightness, or discomfort as you do these exercises, but you should stop right away if you feel sudden pain or your pain gets worse.Do not begin these exercises until told by your health care provider. Stretching and range of motion exercises These exercises warm up your muscles and joints and improve the movement and flexibility of your neck. These exercises also help to relieve pain, numbness, and tingling. Exercise A: Cervical side bend  1. Using good posture, sit on a stable chair or stand up. 2. Without moving your shoulders, slowly tilt your left / right ear to your shoulder until you feel a stretch in your neck muscles. You should be looking straight ahead. 3. Hold for __________ seconds. 4. Repeat with the other side of your neck. Repeat __________ times. Complete this exercise __________ times a day. Exercise B: Cervical rotation  5. Using good posture, sit on a stable chair  or stand up. 6. Slowly turn your head to the side as if you are looking over your left / right shoulder. ? Keep your eyes level with the ground. ? Stop when you feel a stretch along the side and the back of your neck. 7. Hold for __________ seconds. 8. Repeat this by turning to your other side. Repeat __________ times. Complete this exercise __________ times a day. Exercise C: Thoracic extension and pectoral stretch 1. Roll a towel or a small blanket so it is about 4 inches (10 cm) in diameter. 2. Lie down on your back on a firm surface. 3. Put the towel lengthwise, under your spine in the middle of your back. It should not be not under your shoulder blades. The towel should line up with your spine from your middle back to your lower back. 4. Put your hands behind your head and let your elbows fall out to your sides. 5. Hold for __________ seconds. Repeat __________ times. Complete this exercise __________ times a day. Strengthening exercises These exercises build strength and endurance in your neck. Endurance is the ability to use your muscles for a long time, even after your muscles get tired. Exercise D: Upper cervical flexion, isometric 1. Lie on your back with a thin pillow behind your head and a small rolled-up towel under your neck. 2. Gently tuck your chin toward your chest and nod your head down to look toward your feet. Do not lift your head off the pillow. 3. Hold for __________ seconds. 4. Release the tension slowly. Relax your neck muscles completely before you repeat this exercise. Repeat __________ times. Complete this exercise __________ times a day. Exercise E: Cervical extension, isometric  1. Stand about 6 inches (15 cm) away from a wall, with your back facing the wall. 2. Place a soft object, about 6-8 inches (15-20 cm) in diameter, between the back of your head and the wall. A soft object could be a small pillow, a ball, or a folded towel. 3. Gently tilt your head back  and press into the soft object. Keep your jaw and forehead relaxed. 4. Hold for __________ seconds.  5. Release the tension slowly. Relax your neck muscles completely before you repeat this exercise. Repeat __________ times. Complete this exercise __________ times a day. Posture and body mechanics  Body mechanics refers to the movements and positions of your body while you do your daily activities. Posture is part of body mechanics. Good posture and healthy body mechanics can help to relieve stress in your body's tissues and joints. Good posture means that your spine is in its natural S-curve position (your spine is neutral), your shoulders are pulled back slightly, and your head is not tipped forward. The following are general guidelines for applying improved posture and body mechanics to your everyday activities. Standing  When standing, keep your spine neutral and keep your feet about hip-width apart. Keep a slight bend in your knees. Your ears, shoulders, and hips should line up.  When you do a task in which you stand in one place for a long time, place one foot up on a stable object that is 2-4 inches (5-10 cm) high, such as a footstool. This helps keep your spine neutral. Sitting   When sitting, keep your spine neutral and your keep feet flat on the floor. Use a footrest, if necessary, and keep your thighs parallel to the floor. Avoid rounding your shoulders, and avoid tilting your head forward.  When working at a desk or a computer, keep your desk at a height where your hands are slightly lower than your elbows. Slide your chair under your desk so you are close enough to maintain good posture.  When working at a computer, place your monitor at a height where you are looking straight ahead and you do not have to tilt your head forward or downward to look at the screen. Resting When lying down and resting, avoid positions that are most painful for you. Try to support your neck in a neutral  position. You can use a contour pillow or a small rolled-up towel. Your pillow should support your neck but not push on it. This information is not intended to replace advice given to you by your health care provider. Make sure you discuss any questions you have with your health care provider. Document Released: 03/25/2005 Document Revised: 11/30/2015 Document Reviewed: 03/01/2015 [employer] Interactive Patient Education  [employer]2018 [employer]

## 2016-11-04 ENCOUNTER — Ambulatory Visit: Payer: Medicare Other | Attending: Rheumatology | Admitting: Physical Therapy

## 2016-11-07 ENCOUNTER — Encounter (HOSPITAL_BASED_OUTPATIENT_CLINIC_OR_DEPARTMENT_OTHER): Payer: Self-pay

## 2016-11-07 ENCOUNTER — Ambulatory Visit (HOSPITAL_BASED_OUTPATIENT_CLINIC_OR_DEPARTMENT_OTHER)
Admission: RE | Admit: 2016-11-07 | Discharge: 2016-11-07 | Disposition: A | Discharge status: Home / Self Care | Payer: Medicare Other | Source: Ambulatory Visit | Attending: Family Medicine | Admitting: Family Medicine

## 2016-11-07 DIAGNOSIS — Z1231 Encounter for screening mammogram for malignant neoplasm of breast: Secondary | ICD-10-CM | POA: Insufficient documentation

## 2016-11-07 DIAGNOSIS — Z1239 Encounter for other screening for malignant neoplasm of breast: Secondary | ICD-10-CM

## 2016-11-11 ENCOUNTER — Ambulatory Visit (HOSPITAL_COMMUNITY): Payer: Medicare Other

## 2016-11-11 ENCOUNTER — Ambulatory Visit: Payer: Federal, State, Local not specified - PPO | Admitting: Family Medicine

## 2016-11-18 ENCOUNTER — Ambulatory Visit (HOSPITAL_COMMUNITY)
Admission: RE | Admit: 2016-11-18 | Discharge: 2016-11-18 | Disposition: A | Discharge status: Home / Self Care | Payer: Medicare Other | Source: Ambulatory Visit | Attending: Rheumatology | Admitting: Rheumatology

## 2016-11-18 ENCOUNTER — Other Ambulatory Visit: Payer: Self-pay | Admitting: *Deleted

## 2016-11-18 DIAGNOSIS — M542 Cervicalgia: Secondary | ICD-10-CM | POA: Diagnosis not present

## 2016-11-18 DIAGNOSIS — Z79899 Other long term (current) drug therapy: Secondary | ICD-10-CM

## 2016-11-18 DIAGNOSIS — M351 Other overlap syndromes: Secondary | ICD-10-CM

## 2016-11-18 DIAGNOSIS — M5031 Other cervical disc degeneration,  high cervical region: Secondary | ICD-10-CM | POA: Insufficient documentation

## 2016-11-18 DIAGNOSIS — M50223 Other cervical disc displacement at C6-C7 level: Secondary | ICD-10-CM | POA: Diagnosis not present

## 2016-11-18 DIAGNOSIS — M4802 Spinal stenosis, cervical region: Secondary | ICD-10-CM | POA: Diagnosis not present

## 2016-11-18 LAB — CBC WITH DIFFERENTIAL/PLATELET
BASOS ABS: 0 {cells}/uL (ref 0–200)
BASOS PCT: 0 %
EOS ABS: 92 {cells}/uL (ref 15–500)
Eosinophils Relative: 4 %
HEMATOCRIT: 36.4 % (ref 35.0–45.0)
Hemoglobin: 12 g/dL (ref 11.7–15.5)
LYMPHS PCT: 19 %
Lymphs Abs: 437 cells/uL — ABNORMAL LOW (ref 850–3900)
MCH: 28.9 pg (ref 27.0–33.0)
MCHC: 33 g/dL (ref 32.0–36.0)
MCV: 87.7 fL (ref 80.0–100.0)
MONO ABS: 299 {cells}/uL (ref 200–950)
MPV: 10.8 fL (ref 7.5–12.5)
Monocytes Relative: 13 %
NEUTROS ABS: 1472 {cells}/uL — AB (ref 1500–7800)
Neutrophils Relative %: 64 %
Platelets: 278 10*3/uL (ref 140–400)
RBC: 4.15 MIL/uL (ref 3.80–5.10)
RDW: 14.5 % (ref 11.0–15.0)
WBC: 2.3 10*3/uL — ABNORMAL LOW (ref 3.8–10.8)

## 2016-11-18 LAB — COMPLETE METABOLIC PANEL WITH GFR
ALT: 14 U/L (ref 6–29)
AST: 17 U/L (ref 10–35)
Albumin: 3.7 g/dL (ref 3.6–5.1)
Alkaline Phosphatase: 57 U/L (ref 33–130)
BILIRUBIN TOTAL: 0.4 mg/dL (ref 0.2–1.2)
BUN: 10 mg/dL (ref 7–25)
CO2: 26 mmol/L (ref 20–32)
CREATININE: 0.9 mg/dL (ref 0.50–0.99)
Calcium: 9.3 mg/dL (ref 8.6–10.4)
Chloride: 104 mmol/L (ref 98–110)
GFR, EST AFRICAN AMERICAN: 78 mL/min (ref >=60)
GFR, EST NON AFRICAN AMERICAN: 67 mL/min (ref >=60)
Glucose, Bld: 83 mg/dL (ref 65–99)
Potassium: 4.5 mmol/L (ref 3.5–5.3)
Sodium: 139 mmol/L (ref 135–146)
TOTAL PROTEIN: 7.5 g/dL (ref 6.1–8.1)

## 2016-11-18 NOTE — Progress Notes (Signed)
DDD with foraminal narrowing. Please, refer to Dr. Ophelia CharterYates or Dr. Otelia SergeantNitka.

## 2016-11-19 ENCOUNTER — Telehealth: Payer: Self-pay | Admitting: Radiology

## 2016-11-19 DIAGNOSIS — M5030 Other cervical disc degeneration, unspecified cervical region: Secondary | ICD-10-CM

## 2016-11-19 LAB — SEDIMENTATION RATE: Sed Rate: 19 mm/hr (ref 0–30)

## 2016-11-19 LAB — URINALYSIS, ROUTINE W REFLEX MICROSCOPIC
Bilirubin Urine: NEGATIVE
Glucose, UA: NEGATIVE
Hgb urine dipstick: NEGATIVE
KETONES UR: NEGATIVE
LEUKOCYTES UA: NEGATIVE
NITRITE: NEGATIVE
Protein, ur: NEGATIVE
SPECIFIC GRAVITY, URINE: 1.011 (ref 1.001–1.035)
pH: 6 (ref 5.0–8.0)

## 2016-11-19 LAB — CP5000020 ENA PANEL
DS DNA AB: 22 [IU]/mL — AB
ENA SM Ab Ser-aCnc: >8 — ABNORMAL HIGH
RIBONUCLEIC PROTEIN(ENA) ANTIBODY, IGG: POSITIVE — AB
SCLERODERMA (SCL-70) (ENA) ANTIBODY, IGG: NEGATIVE
SSA (Ro) (ENA) Antibody, IgG: 5.8 — ABNORMAL HIGH
SSB (La) (ENA) Antibody, IgG: <1

## 2016-11-19 LAB — C3 AND C4
C3 Complement: 33 mg/dL — ABNORMAL LOW (ref 83–193)
C4 Complement: 5 mg/dL — ABNORMAL LOW (ref 15–57)

## 2016-11-19 NOTE — Telephone Encounter (Signed)
-----   Message from Pollyann SavoyShaili Deveshwar, MD sent at 11/18/2016  1:54 PM EDT ----- DDD with foraminal narrowing. Please, refer to Dr. Ophelia CharterYates or Dr. Otelia SergeantNitka.

## 2016-11-20 ENCOUNTER — Ambulatory Visit: Payer: Medicare Other | Attending: Rheumatology | Admitting: Physical Therapy

## 2016-11-20 ENCOUNTER — Telehealth: Payer: Self-pay | Admitting: Radiology

## 2016-11-20 DIAGNOSIS — R293 Abnormal posture: Secondary | ICD-10-CM

## 2016-11-20 DIAGNOSIS — M25512 Pain in left shoulder: Secondary | ICD-10-CM | POA: Insufficient documentation

## 2016-11-20 DIAGNOSIS — G8929 Other chronic pain: Secondary | ICD-10-CM | POA: Insufficient documentation

## 2016-11-20 DIAGNOSIS — M6281 Muscle weakness (generalized): Secondary | ICD-10-CM | POA: Diagnosis not present

## 2016-11-20 DIAGNOSIS — M25511 Pain in right shoulder: Secondary | ICD-10-CM | POA: Diagnosis not present

## 2016-11-20 MED ORDER — PREDNISONE 5 MG PO TABS: 10.0000 mg | ORAL_TABLET | Freq: Every day | ORAL | 0 refills | Status: DC

## 2016-11-20 NOTE — Progress Notes (Signed)
Her labs indicate flare. Please, advice to increase Prednisone to 10 mg po qd.

## 2016-11-20 NOTE — Therapy (Signed)
[facility] Outpatient Rehabilitation [facility] [address]  [address] [place], [place], [address] Phone: [phone]   Fax:  [phone]  Physical Therapy Evaluation  Patient Details  Name: Madeline Harper MRN: [id number] Date of Birth: 22-May-1950 Referring Provider: Dr. Pollyann Savoy  Encounter Date: 11/20/2016      PT End of Session - 11/20/16 1346    Visit Number 1   Number of Visits 12   Date for PT Re-Evaluation 01/01/17   Authorization Type Medicare   PT Start Time 1016   PT Stop Time 1120   PT Time Calculation (min) 64 min   Activity Tolerance Patient tolerated treatment well   Behavior During Therapy [facility] for tasks assessed/performed      Past Medical History:  Diagnosis Date  . Autoimmune disease (HCC)   . Bone spur of foot   . Cataract     Past Surgical History:  Procedure Laterality Date  . CATARACT EXTRACTION, BILATERAL    . FOOT SURGERY    . KIDNEY STONE SURGERY  1972  . TUBAL LIGATION  1970    There were no vitals filed for this visit.       Subjective Assessment - 11/20/16 1020    Subjective patient reporting history of B shoulder pain. B shoulder injection - did feel better afterwards, but doesn;t want to keep gttig shots. Has pain with rolling over onto shoulders. Reports autoimmune disease - mixed connective tissue - controlled with medication. Reports she has good days and bad days.    Pertinent History Autoimmune disease   Diagnostic tests Xray and MRI: Patient reporitng cervical arthritis   Patient Stated Goals improve pain and function   Currently in Pain? Yes   Pain Score 0-No pain  typical daily average 5/10   Pain Location Shoulder   Pain Orientation Right;Left   Pain Descriptors / Indicators Aching;Sore   Pain Type Chronic pain   Pain Onset More than a month ago   Pain Frequency Intermittent   Aggravating Factors  movements, lifting   Pain Relieving Factors tylenol, rest            [facility] PT Assessment -  11/20/16 1025      Assessment   Medical Diagnosis Chronic pain of both shoulders   Referring Provider Dr. Pollyann Savoy   Onset Date/Surgical Date --  ~6 months   Next MD Visit --  ~4 weeks   Prior Therapy no     Precautions   Precautions None     Restrictions   Weight Bearing Restrictions No     Balance Screen   Has the patient fallen in the past 6 months No   Has the patient had a decrease in activity level because of a fear of falling?  No   Is the patient reluctant to leave their home because of a fear of falling?  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   Leisure exercsie     Cognition   Overall Cognitive Status Within Functional Limits for tasks assessed     Observation/Other Assessments   Focus on Therapeutic Outcomes (FOTO)  Shoulder:53 (47% limited, predicted 33% limited)     Sensation   Light Touch Appears Intact     Coordination   Gross Motor Movements are Fluid and Coordinated Yes  Posture/Postural Control   Posture/Postural Control Postural limitations   Postural Limitations Rounded Shoulders;Forward head     ROM / Strength   AROM / PROM / Strength AROM;Strength     AROM   AROM Assessment Site Shoulder   Right/Left Shoulder Right;Left   Right Shoulder Flexion 168 Degrees   Right Shoulder ABduction 120 Degrees   Right Shoulder Internal Rotation --  FIR to ~ L1   Right Shoulder External Rotation --  FER to ~C6   Left Shoulder Flexion 166 Degrees   Left Shoulder ABduction 149 Degrees   Left Shoulder Internal Rotation --  FIR to ~L3   Left Shoulder External Rotation --  FER to ~C6     Strength   Overall Strength Comments pain with all strength testing   Strength Assessment Site Shoulder   Right/Left Shoulder Right;Left   Right Shoulder Flexion 4-/5   Right Shoulder ABduction 4/5    Right Shoulder Internal Rotation 4/5   Right Shoulder External Rotation 4-/5   Left Shoulder Flexion 4-/5   Left Shoulder ABduction 4-/5   Left Shoulder Internal Rotation 4-/5   Left Shoulder External Rotation 3+/5     Palpation   Palpation comment TTP B UT, B LS, B cervical paraspinals            Objective measurements completed on examination: See above findings.          [facility] Adult PT Treatment/Exercise - 11/20/16 1025      Exercises   Exercises Shoulder     Shoulder Exercises: Seated   Other Seated Exercises chin tuck - 10 x 5 sec   Other Seated Exercises scap retraction - 10 x 5 seconds     Shoulder Exercises: Standing   Row Both;15 reps;Theraband   Theraband Level (Shoulder Row) Level 2 (Red)     Shoulder Exercises: Stretch   Other Shoulder Stretches B UT - 3 x 30 sec     Modalities   Modalities Electrical Stimulation;Moist Heat     Moist Heat Therapy   Number Minutes Moist Heat 15 Minutes   Moist Heat Location Cervical;Shoulder     Electrical Stimulation   Electrical Stimulation Location B UT to B cervical para.   Electrical Stimulation Action IFC   Electrical Stimulation Parameters to tolerance   Electrical Stimulation Goals Pain;Tone     Manual Therapy   Manual therapy comments discussion for self massage with balla against wall                PT Education - 11/20/16 1055    Education provided Yes   Education Details exam findings, POC, HEP   Person(s) Educated Patient   Methods Explanation;Demonstration;Handout   Comprehension Verbalized understanding;Returned demonstration;Need further instruction             PT Long Term Goals - 11/20/16 1416      PT LONG TERM GOAL #1   Title patient to be independent with HEP    Status New   Target Date 01/01/17     PT LONG TERM GOAL #2   Title patient to demonstrate AROM of B shoulders to WNL and symmetrical without pain limiting   Status New   Target Date 01/01/17     PT LONG  TERM GOAL #3   Title patient to improve B UE strength to >/= 4+/5   Status New   Target Date 01/01/17     PT LONG TERM GOAL #4   Title patient to report ability to  return to exercise regimen without pain   Status New   Target Date 01/01/17     PT LONG TERM GOAL #5   Title patient to report ability to perform ADLs and household work without an increase in pain   Status New   Target Date 01/01/17                Plan - 2016/12/01 1412    Clinical Impression Statement Patient is a 66 y/o female presenting to OPPT today with primary complaints of B shoulder pain that has been present for approx. 6 months with no known mechanism of injury. Patient today demonstrating reduced AROM of B shoulders with abduction and IR most limited, however, full and pain free with PROM by PT. Strength deficits noted at B shoulder with pain production during all strength testing. Patient also TTP over B UT, B LS, B cervical paraspinals, as well as posterior shoulder complex. Patient given inital HEP for gentle strenghtening and stretching with good carryover. Patient to benefit from PT to address functional mobility limitations to allow patient to return to leisure activities without pain interference.    Clinical Presentation Stable   Clinical Decision Making Low   Rehab Potential Good   PT Frequency 2x / week   PT Duration 6 weeks   PT Treatment/Interventions ADLs/Self Care Home Management;Cryotherapy;Electrical Stimulation;Iontophoresis 4mg /ml Dexamethasone;Moist Heat;Ultrasound;Therapeutic exercise;Therapeutic activities;Patient/family education;Manual techniques;Passive range of motion;Vasopneumatic Device;Taping;Dry needling   Consulted and Agree with Plan of Care Patient      Patient will benefit from skilled therapeutic intervention in order to improve the following deficits and impairments:  Pain, Impaired UE functional use, Postural dysfunction, Decreased range of motion  Visit  Diagnosis: Chronic left shoulder pain - Plan: PT plan of care cert/re-cert  Chronic right shoulder pain - Plan: PT plan of care cert/re-cert  Abnormal posture - Plan: PT plan of care cert/re-cert  Muscle weakness (generalized) - Plan: PT plan of care cert/re-cert      G-Codes - 12-01-16 1339    Functional Assessment Tool Used (Outpatient Only) FOTO: 53 (47% limited)   Functional Limitation Carrying, moving and handling objects   Carrying, Moving and Handling Objects Current Status (W1191) At least 40 percent but less than 60 percent impaired, limited or restricted   Carrying, Moving and Handling Objects Goal Status (Y7829) At least 20 percent but less than 40 percent impaired, limited or restricted       Problem List Patient Active Problem List   Diagnosis Date Noted  . Mixed connective tissue disease (HCC) 07/12/2016  . 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 High risk medication use 05/28/2016  . Raynaud's disease without gangrene 05/28/2016  . Bilateral foot pain 03/27/2016  . Trigger index finger of right hand 03/27/2016  . Elevated sedimentation rate 02/28/2016  . Positive ANA (antinuclear antibody) 02/27/2016  . Bilateral shoulder pain 02/27/2016  . Status post left foot surgery 06/28/2015  . Bone spur of foot 06/14/2015      Kipp Laurence, PT, DPT 2016/12/01 2:20 PM   [facility] Outpatient [facility] [address]  [address] [place], [place], [address] Phone: [phone]   Fax:  [phone]  Name: Madeline Harper MRN: [id number] Date of Birth: 1950-11-09

## 2016-11-20 NOTE — Telephone Encounter (Signed)
I have called patient to advise of lab results, she states she feels fine, have advised her per Dr Corliss Skainseveshwar it is very important for her to use the prednisone. Patient states she does not want to use it, I stressed the importance, [facility], information was sent in March but patient states she never got a call, will have Jasmine DecemberSharon check on referral for her  To you [facility]

## 2016-11-20 NOTE — Patient Instructions (Signed)
Flexibility: Upper Trapezius Stretch   Gently grasp right side of head while reaching behind back with other hand. Tilt head away until a gentle stretch is felt. Hold __30__ seconds. Repeat __3__ times per set. Do __2__ sessions per day.  Scapular Retraction (Standing)   With arms at sides, pinch shoulder blades together. Repeat __15__ times per set. Do _2-3___ sessions per day.  Resistive Band Rowing   With resistive band anchored in door, grasp both ends. Keeping elbows bent, pull back, squeezing shoulder blades together. Hold __5__ seconds. Repeat _15___ times. Do __2__ sessions per day.  Axial Extension (Chin Tuck)   Pull chin in and lengthen back of neck. Hold __5-10__ seconds while counting out loud. Repeat __15__ times. Do __2__ sessions per day.

## 2016-11-20 NOTE — Telephone Encounter (Signed)
-----   Message from Pollyann SavoyShaili Deveshwar, MD sent at 11/20/2016  1:47 PM EDT ----- Her labs indicate flare. Please, advice to increase Prednisone to 10 mg po qd.

## 2016-11-22 ENCOUNTER — Telehealth: Payer: Self-pay | Admitting: Radiology

## 2016-11-22 NOTE — Telephone Encounter (Signed)
Patient has called back with additional questions about why she needs the prednisone     I will call her again / currently I am at front desk and can not go into much detail

## 2016-11-22 NOTE — Telephone Encounter (Signed)
Called patient to discuss prednisone further, she states she worked with [place] to wean off prednisone, now we are putting her  Back on it. I did explain to her that our goal once again will be to wean her off the prednisone, but currently she needs it based on her abnormal labs. Explained the importance again  She has voiced understanding.  To you FYI   I have told her Jasmine December is working on the referral to [facility]

## 2016-11-26 ENCOUNTER — Ambulatory Visit: Payer: Medicare Other

## 2016-11-26 DIAGNOSIS — R293 Abnormal posture: Secondary | ICD-10-CM | POA: Diagnosis not present

## 2016-11-26 DIAGNOSIS — G8929 Other chronic pain: Secondary | ICD-10-CM | POA: Diagnosis not present

## 2016-11-26 DIAGNOSIS — M25511 Pain in right shoulder: Secondary | ICD-10-CM | POA: Diagnosis not present

## 2016-11-26 DIAGNOSIS — M25512 Pain in left shoulder: Secondary | ICD-10-CM | POA: Diagnosis not present

## 2016-11-26 DIAGNOSIS — M6281 Muscle weakness (generalized): Secondary | ICD-10-CM

## 2016-11-26 NOTE — Therapy (Addendum)
[facility] [place] [address]  [place], [place], [address] Phone: [phone]   Fax:  [phone]  Physical Therapy Treatment  Patient Details  Name: Madeline Harper MRN: [id number] Date of Birth: Aug 02, 1950 Referring Provider: Dr. Bo Merino  Encounter Date: 11/26/2016      PT End of Session - 11/26/16 1321    Visit Number 2   Number of Visits 12   Date for PT Re-Evaluation 01/01/17   Authorization Type Medicare   PT Start Time 1315   PT Stop Time 1400   PT Time Calculation (min) 45 min   Activity Tolerance Patient tolerated treatment well   Behavior During Therapy [facility] for tasks assessed/performed      Past Medical History:  Diagnosis Date  . Autoimmune disease ([place])   . Bone spur of foot   . Cataract     Past Surgical History:  Procedure Laterality Date  . CATARACT EXTRACTION, BILATERAL    . FOOT SURGERY    . [place]  . TUBAL LIGATION  1970    There were no vitals filed for this visit.      Subjective Assessment - 11/26/16 1319    Subjective Pt. admitting to not performing HEP since evaluation.     Patient Stated Goals improve pain and function   Currently in Pain? No/denies   Pain Score 0-No pain   Multiple Pain Sites No                         [facility] Adult PT Treatment/Exercise - 11/26/16 1323      Neck Exercises: Standing   Neck Retraction 10 reps;5 secs     Neck Exercises: Seated   Neck Retraction 10 reps;5 secs   Neck Retraction Limitations some cueing for proper technique     Shoulder Exercises: Standing   External Rotation 10 reps;Theraband  at doorseal + scap. retraction    Theraband Level (Shoulder External Rotation) Level 1 (Yellow)   Extension 15 reps;Theraband   Theraband Level (Shoulder Extension) Level 1 (Yellow)   Row Both;15 reps;Theraband   Theraband Level (Shoulder Row) Level 2 (Red)     Shoulder Exercises: ROM/Strengthening    UBE (Upper Arm Bike) UBE: lvl 1.5, 2 min each way     Moist Heat Therapy   Number Minutes Moist Heat 15 Minutes   Moist Heat Location Cervical;Shoulder     Electrical Stimulation   Electrical Stimulation Location B UT to B cervical para.   Electrical Stimulation Action IFC   Electrical Stimulation Parameters to tolerance   Electrical Stimulation Goals Pain;Tone     Manual Therapy   Manual Therapy Soft tissue mobilization;Myofascial release   Manual therapy comments Seated    Soft tissue mobilization STM to B UT, Levator scap., B paraspinals; noted good benefit from this   Myofascial Release TPR to B UT; noted relief with this     Neck Exercises: Stretches   Upper Trapezius Stretch 2 reps;30 seconds   Upper Trapezius Stretch Limitations no overpressure    Levator Stretch 2 reps;30 seconds   Levator Stretch Limitations no overpressure                      PT Long Term Goals - 11/26/16 1330      PT LONG TERM GOAL #1   Title patient to be independent with HEP    Status On-going  PT LONG TERM GOAL #2   Title patient to demonstrate AROM of B shoulders to WNL and symmetrical without pain limiting   Status On-going     PT LONG TERM GOAL #3   Title patient to improve B UE strength to >/= 4+/5   Status On-going     PT LONG TERM GOAL #4   Title patient to report ability to return to exercise regimen without pain   Status On-going     PT LONG TERM GOAL #5   Title patient to report ability to perform ADLs and household work without an increase in pain   Status On-going               Plan - 2016-12-24 1328    Clinical Impression Statement Pt. admitting to poor adherence to HEP since evaluation.  HEP reviewed with pt. today with pt. requiring some cueing for proper technique.  Tolerated gentle scapular strengthening well today.  STM/TPR to B UT, Levator Scap., cervical paraspinals with pt. noting good relief with this.  Noted good relief from E-stim. moist  heat last treatment thus treatment ending with this to decrease upper shoulder and cervical tone.     PT Treatment/Interventions ADLs/Self Care Home Management;Cryotherapy;Electrical Stimulation;Iontophoresis 42m/ml Dexamethasone;Moist Heat;Ultrasound;Therapeutic exercise;Therapeutic activities;Patient/family education;Manual techniques;Passive range of motion;Vasopneumatic Device;Taping;Dry needling      Patient will benefit from skilled therapeutic intervention in order to improve the following deficits and impairments:  Pain, Impaired UE functional use, Postural dysfunction, Decreased range of motion  Visit Diagnosis: Chronic left shoulder pain  Chronic right shoulder pain  Abnormal posture  Muscle weakness (generalized)  G-Codes - 018-Sep-2018   Functional Assessment Tool Used (Outpatient Only) FOTO: 53 (47% limited)   Functional Limitation Carrying, moving and handling objects   Carrying, Moving and Handling Objects Current Status ((N8177 At least 40 percent but less than 60 percent impaired, limited or restricted   Carrying, Moving and Handling Objects Goal Status ((N1657 At least 20 percent but less than 40 percent impaired, limited or restricted   Carrying, Moving and Handling Objects Discharge Status [phone] At least 40 percent but less than 60 percent impaired, limited or restricted      Problem List Patient Active Problem List   Diagnosis Date Noted  . [place] connective tissue disease ([place] 07/12/2016  . UIP (usual interstitial pneumonitis) ([place] - [facility]  . High risk medication use 05/28/2016  . Raynaud's disease without gangrene 05/28/2016  . Bilateral foot pain 03/27/2016  . Trigger index finger of right hand 03/27/2016  . Elevated sedimentation rate 02/28/2016  . Positive ANA (antinuclear antibody) 02/27/2016  . Bilateral shoulder pain 02/27/2016  . Status post left foot surgery 06/28/2015  . Bone spur of foot 06/14/2015    MBess Harvest  PTA 02018-09-186:08 PM   PHYSICAL THERAPY DISCHARGE SUMMARY  Visits from Start of Care: 2  Current functional level related to goals / functional outcomes: See above   Remaining deficits: See above; patient with poor HEP compliance, did not return to PT   Education / Equipment: HEP  Plan: Patient agrees to discharge.  Patient goals were not met. Patient is being discharged due to not returning since the last visit.  ?????    SLanney Gins PT, DPT 01/21/17 8:17 AM   [facility] [place] [address]Phone: [phone]  Fax:  [phone] Name: DDailyn ReithMRN: [id number]  of Birth: 04-21-1950

## 2016-11-29 ENCOUNTER — Ambulatory Visit: Payer: Medicare Other | Admitting: Physical Therapy

## 2016-12-02 ENCOUNTER — Ambulatory Visit: Payer: Medicare Other | Admitting: Physical Therapy

## 2016-12-04 ENCOUNTER — Ambulatory Visit: Payer: Medicare Other | Admitting: Physical Therapy

## 2016-12-05 NOTE — Telephone Encounter (Signed)
[facility]

## 2016-12-11 DIAGNOSIS — Z79899 Other long term (current) drug therapy: Secondary | ICD-10-CM | POA: Diagnosis not present

## 2016-12-11 DIAGNOSIS — Z888 Allergy status to other drugs, medicaments and biological substances status: Secondary | ICD-10-CM | POA: Diagnosis not present

## 2016-12-11 DIAGNOSIS — Z87891 Personal history of nicotine dependence: Secondary | ICD-10-CM | POA: Diagnosis not present

## 2016-12-11 DIAGNOSIS — E559 Vitamin D deficiency, unspecified: Secondary | ICD-10-CM | POA: Diagnosis not present

## 2016-12-11 DIAGNOSIS — M351 Other overlap syndromes: Secondary | ICD-10-CM | POA: Diagnosis not present

## 2016-12-13 ENCOUNTER — Encounter: Payer: Self-pay | Admitting: *Deleted

## 2016-12-23 ENCOUNTER — Encounter: Payer: Self-pay | Admitting: Internal Medicine

## 2016-12-23 ENCOUNTER — Ambulatory Visit (INDEPENDENT_AMBULATORY_CARE_PROVIDER_SITE_OTHER): Payer: Medicare Other | Admitting: Internal Medicine

## 2016-12-23 VITALS — BP 140/80 | HR 85 | Ht 59.75 in | Wt 141.0 lb

## 2016-12-23 DIAGNOSIS — J84112 Idiopathic pulmonary fibrosis: Secondary | ICD-10-CM

## 2016-12-23 DIAGNOSIS — M351 Other overlap syndromes: Secondary | ICD-10-CM | POA: Diagnosis not present

## 2016-12-23 DIAGNOSIS — R05 Cough: Secondary | ICD-10-CM

## 2016-12-23 DIAGNOSIS — R059 Cough, unspecified: Secondary | ICD-10-CM

## 2016-12-23 LAB — PULMONARY FUNCTION TEST
DL/VA % PRED: 83 %
DL/VA: 3.48 ml/min/mmHg/L
DLCO COR: 10.06 ml/min/mmHg
DLCO cor % pred: 54 %
DLCO unc % pred: 61 %
DLCO unc: 11.37 ml/min/mmHg
FEF 25-75 Pre: 3.43 L/sec
FEF2575-%Pred-Pre: 217 %
FEV1-%Pred-Pre: 115 %
FEV1-PRE: 1.83 L
FEV1FVC-%Pred-Pre: 116 %
FEV6-%Pred-Pre: 101 %
FEV6-Pre: 1.98 L
FEV6FVC-%PRED-PRE: 104 %
FVC-%PRED-PRE: 98 %
FVC-Pre: 2.02 L
PRE FEV6/FVC RATIO: 100 %
Pre FEV1/FVC ratio: 91 %

## 2016-12-23 NOTE — Progress Notes (Addendum)
PFT done today.   Walking desaturation test on 12/23/2016 185 feet x 3 laps:  did NOT desaturate. Rest pulse ox was 90%, final pulse ox was 97%. HR response 84/min at rest to 116/min at peak exertion.  Dr. Kalman Shan, M.D., [facility].C.P Pulmonary and Critical Care Medicine Staff Physician [facility] [place] [facility] Pager: [phone], If no answer or between  15:00h - 7:00h: call [phone]  12/23/2016 11:35 AM

## 2016-12-23 NOTE — Patient Instructions (Signed)
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 Cough  - to an extent the cough and phlegm are going to be part of you - try mucinex daily to break up sputum  - if this does not work we can try saline nebulizer  - any change in sputum can reflect infection, please call us then  MCTD (mixed connective tissue disease) (HCC)  - d/w Dr Corliss Skains: best served by 1 rheumatologist - continue at [facility] or her - for now I recommend the [facility] plan of immuran with plaquenil addition and seeing how your joint control is  - recommend PT and water aerobics through  Your PCP or rheumatologist  Followup  - in 6 months do Pre-bd spiro and dlco only. No lung volume or bd response. No post-bd spiro - return to see me in 6 months

## 2016-12-23 NOTE — Addendum Note (Signed)
Addended by: Velvet Bathe on: 12/23/2016 11:39 AM   Modules accepted: Orders

## 2016-12-23 NOTE — Progress Notes (Signed)
Subjective:     Patient ID: Madeline Harper, female   DOB: 06/07/1950, 66 y.o.   MRN: [id number]  HPI   HPI  Chief Complaint  Patient presents with  . Pulmonary Consult    Pt referred by Dr. Corliss Skains for abnormal lung sounds. Pt c/o DOE, prod cough with yellow mucus in morning - resolves throughout the day. Pt denies CP/tightness and f/c/s.      Notes from rheumatologist Dr. D reviewed and summarized Patient has a constellation of ANA greater than 1:1280, positive double-stranded DNA, positive RNP, positive Ro, low C3 and C4 associated with elevated sedimentation rate of 115, arthritis and raynaud. 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 During visit on 03/27/2016 based on my summarization of the chart crackles was observed in the flowsheet been referred here.  Madeline Harper presents for evaluation today. She tells me that for the last 1 year she's had arthralgia and Raynaud symptoms this then resulted in the mixed connective tissue diagnosis as stated above. She also tells me for the last 1 year after having a cold she's had chronic cough associated with sputum production. It is particularly worse in the daytime but she does cough during the rest of the day. She also tells me that she has mild dyspnea and exertion for climbing stairs. She tells me that she works out regularly at [employer] but she does not feel dyspneic although she demonstrated that she has labored breathing. This all improved with rest. 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 She is also wondering if the doctors of performing unnecessary tests on her.   Results for AARIYAH, SAMPEY (MRN [id number]) as of 05/22/2016 12:00  Ref. Range 02/28/2016 09:59  Creatinine Latest Ref Range: 0.50 - 0.99 mg/dL 7.82    She had a chest x-ray 03/27/2016: Shows coarse interstitial markings suggestive of interstitial lung  disease  Results for JAZYIAH, YIU (MRN [id number]) as of 05/22/2016 12:00  Ref. Range 02/28/2016 09:59  Cyclic Citrullin Peptide Ab Latest Units: Units <16  ds DNA Ab Latest Units: IU/mL 14 (H)  C3 Complement Latest Ref Range: 90 - 180 mg/dL 40 (L)  C4 Complement Latest Ref Range: 16 - 47 mg/dL 8 (L)  IgG (Immunoglobin G), Serum Latest Ref Range: 694 - 1,618 mg/dL 5,784 (H)  IgA Latest Ref Range: 81 - 463 mg/dL 696  EXBMW-4-XLKGMWNU Latest Ref Range: 0.2 - 0.3 g/dL 0.3  UVOZD-6-UYQIHKVQ Latest Ref Range: 0.5 - 0.9 g/dL 0.8  Ribonucleic Protein(ENA) Antibody, IgG Latest Ref Range: <1.0 NEG AI  >8.0 POS (H)  SSA (Ro) (ENA) Antibody, IgG Latest Ref Range: <1.0 NEG AI  5.7 POS (H)  SSB (La) (ENA) Antibody, IgG Latest Ref Range: <1.0 NEG AI  <1.0 NEG  Scleroderma (Scl-70) (ENA) Antibody, IgG Latest Ref Range: <1.0 NEG AI  <1.0 NEG      has a past medical history of Bone spur of foot.   reports that she quit smoking about 36 years ago. Her smoking use included Cigarettes. She has a 30.00 pack-year smoking history. She has never used smokeless tobacco.   OV 06/26/2016  Chief Complaint  Patient presents with  . Follow-up    Pt here after CT chest, PFT, and echo. Pt deneis change in breahting since last OV.    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 In the interim  she did see Dr. Algis Downs on 05/28/2016 and has been recommended Imuran based on her symptoms, autoimmune profile and presence of UIP on CT chest. The show that she has grade 1 diastolic dysfunction associated borderline elevation of pulmonary artery systolic pressure which could easily be due to grade 1 diastolic dysfunction. 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 Based on pulmonary function testing and is only mild severe   The overwhelming thing for this visit is that she is extremely scared to take Imuran  another immune suppressants. She is worried about the cancer risk. We talked about the spiritual distress that she is in. We talked about the pros and cons of these medications. We talked about the risk of disease progression particularly pulmonary fibrosis. We talked about the future of drugs that are less toxic being developed such as Pirfenidone (Esbriet) and Ofev . We talked about  having to manage her chronic disease not be in denial. After all this she was somewhat reassured we talked about the need to monitor  She does have early morning cough with some mild sputum for which she wants symptom relief   Echocardiogram 05/29/16: Shows ejection fraction of 65% with grade 1 diastoic dysfunction and a PSA systolic pressure off 28 mmHg  IMPRESSION: 1. 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 2. Aortic atherosclerosis.   Electronically Signed   By: Trudie Reed M.D.   On: 05/28/2016 14:41   OV 12/23/2016  Chief Complaint  Patient presents with  . Follow-up    Pt states that when she goes up the stairs, she becomes SOB and has to sit down. With just walking she states that she does not become SOB. C/o prod. cough in the A.M with yellow phlem.    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  Last visit was in March 2018. Because of the UIP pattern and despite mild interstitial lung disease and ongoing significant joint symptoms I supported the recommendation of the Imuran. Review of the chart from the rheumatologist shows that she was started on Imuran April 2018 at 50 mg per day. Then on 08/20/2016 it was increased to 75 mg per day. Currently she is on 100 mg per day of Imuran Her prednisone was tapered to 5 mg per day by 08/30/2016. In her most recent visit 10/24/2016 with Dr. Algis Downs it appears that she was still having  significant pain in her shoulders and spine from her mixed connective tissue disease. PAtient tells me 12/23/2016 after that visit she was then recommended to restart prednisone per Dr D (she was weaned off by then). So, she says she felt frustrated. She was then referred to [facility] sawe Dr Artis Flock 9/.5/18 and plaquenil added at this visit. Continue on  Immuran. Patient does not know if at this stage for joint symptoms are well-controlled with the addition of Plaquenil because it is only been 12 days or so. She is a little bit frustrated with the direction of her autoimmune therapy. In discussion with Dr. Algis Downs today over the telephone and it is felt that it is best that her autoimmune diseases is best served at [facility]. In talking to the patient she is not doing any physical therapy anymore or doing water aerobics. Her main joint issue appears to be stiffness especially early in the morning particularly the right shoulder  In terms  of her arrest or symptoms she stable. The dyspnea is only mild. Her main issues that early morning she's having congestion with mild yellow sputum that is stable for many years. This no associated worsening of shortness of breath. This no change in color of sputum or wheezing. Her pulmonary function test shows that her ILD stable compared to last visit.    Results for STEPHENI, CAMERON (MRN [id number]) as of 12/23/2016 11:09  Ref. Range 06/25/2016 11:11 12/23/2016 09:51  FVC-Pre Latest Units: L 1.93 2.02  FVC-%Pred-Pre Latest Units: % 93 98   Results for GINAMARIE, BANFIELD (MRN [id number]) as of 12/23/2016 11:09  Ref. Range 06/25/2016 11:11 12/23/2016 09:51  DLCO unc Latest Units: ml/min/mmHg 13.70 11.37  DLCO unc % pred Latest Units: % 73 61      has a past medical history of Autoimmune disease (HCC); Bone spur of foot; and Cataract.   reports that she quit smoking about 36 years ago. Her smoking use included Cigarettes. She has a 30.00 pack-year smoking history. She has never used smokeless  tobacco.  Past Surgical History:  Procedure Laterality Date  . CATARACT EXTRACTION, BILATERAL    . FOOT SURGERY    . KIDNEY STONE SURGERY  1972  . TUBAL LIGATION  1970    Allergies  Allergen Reactions  . Lyrica [Pregabalin] Swelling    Pedal edema      There is no immunization history on file for this patient.  Family History  Problem Relation Age of Onset  . Diabetes Mother   . Heart disease Mother   . Kidney failure Mother   . Cancer Mother        Uterine Cancer  . Diabetes Maternal Grandmother   . Kidney failure Maternal Grandmother   . Stroke Sister   . Gout Maternal Grandfather      Current Outpatient Prescriptions:  .  acetaminophen (TYLENOL 8 HOUR ARTHRITIS PAIN) 650 MG CR tablet, Take 650 mg by mouth every 8 (eight) hours as needed for pain., Disp: , Rfl:  .  azaTHIOprine (IMURAN) 50 MG tablet, Take 2 tablets (100 mg total) by mouth daily., Disp: 60 tablet, Rfl: 2 .  calcium carbonate (CALCIUM 600) 600 MG TABS tablet, Take 600 mg by mouth daily., Disp: , Rfl:  .  estradiol (ESTRACE VAGINAL) 0.1 MG/GM vaginal cream, as needed. , Disp: , Rfl:  .  hydroxychloroquine (PLAQUENIL) 200 MG tablet, Take 300 mg by mouth., Disp: , Rfl:  .  Multiple Vitamins-Minerals (MULTI ADULT GUMMIES PO), Take 1 tablet by mouth daily., Disp: , Rfl:    Review of Systems     Objective:   Physical Exam  Constitutional: She is oriented to person, place, and time. She appears well-developed and well-nourished. No distress.  HENT:  Head: Normocephalic and atraumatic.  Right Ear: External ear normal.  Left Ear: External ear normal.  Mouth/Throat: Oropharynx is clear and moist. No oropharyngeal exudate.  Eyes: Pupils are equal, round, and reactive to light. Conjunctivae and EOM are normal. Right eye exhibits no discharge. Left eye exhibits no discharge. No scleral icterus.  Neck: Normal range of motion. Neck supple. No JVD present. No tracheal deviation present. No thyromegaly present.   Cardiovascular: Normal rate, regular rhythm, normal heart sounds and intact distal pulses.  Exam reveals no gallop and no friction rub.   No murmur heard. Pulmonary/Chest: Effort normal. No respiratory distress. She has no wheezes. She has rales. She exhibits no tenderness.  Abdominal: Soft. Bowel sounds are normal. She exhibits no  distension and no mass. There is no tenderness. There is no rebound and no guarding.  Musculoskeletal: Normal range of motion. She exhibits no edema or tenderness.  Mild swelling in her digital joints in the hand  Lymphadenopathy:    She has no cervical adenopathy.  Neurological: She is alert and oriented to person, place, and time. She has normal reflexes. No cranial nerve deficit. She exhibits normal muscle tone. Coordination normal.  Skin: Skin is warm and dry. No rash noted. She is not diaphoretic. No erythema. No pallor.  Mild skin tightening present  Psychiatric: She has a normal mood and affect. Her behavior is normal. Judgment and thought content normal.  Vitals reviewed.  Vitals:   12/23/16 1102  BP: 140/80  Pulse: 85  SpO2: 97%  Weight: 141 lb (64 kg)  Height: 4' 11.75" (1.518 m)  Estimated body mass index is 27.77 kg/m as calculated from the following:   Height as of this encounter: 4' 11.75" (1.518 m).   Weight as of this encounter: 141 lb (64 kg).      Assessment:       ICD-10-CM   1. 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 Cough R05   3. 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 Cough  - to an extent the cough and phlegm are going to be part of you - try mucinex daily to break up sputum  - if this does not work we can try saline nebulizer  - any change in sputum can reflect infection, please call us then  MCTD (mixed connective tissue disease) (HCC)  - d/w Dr Corliss Skains: best served by 1 rheumatologist -  continue at [facility] or her - for now I recommend the [facility] plan of immuran with plaquenil addition and seeing how your joint control is  - recommend PT and water aerobics through  Your PCP or rheumatologist  Followup  - in 6 months do Pre-bd spiro and dlco only. No lung volume or bd response. No post-bd spiro - return to see me in 6 months  > 50% of this > 25 min visit spent in face to face counseling or coordination of care    Dr. Kalman Shan, M.D., [facility]C.P Pulmonary and Critical Care Medicine Staff Physician [facility] [place] [facility] Pager: [phone], If no answer or between  15:00h - 7:00h: call [phone]  12/23/2016 11:34 AM

## 2016-12-24 ENCOUNTER — Telehealth: Payer: Self-pay | Admitting: Internal Medicine

## 2016-12-24 MED ORDER — GUAIFENESIN ER 600 MG PO TB12: 600.0000 mg | ORAL_TABLET | Freq: Two times a day (BID) | ORAL | 0 refills | Status: DC

## 2016-12-24 NOTE — Telephone Encounter (Signed)
I can send a Rx in for pt. Called pt and advised her I would do this. Nothing further is needed.

## 2017-01-10 ENCOUNTER — Ambulatory Visit (INDEPENDENT_AMBULATORY_CARE_PROVIDER_SITE_OTHER): Payer: Medicare Other

## 2017-01-10 ENCOUNTER — Encounter: Payer: Self-pay | Admitting: Podiatry

## 2017-01-10 ENCOUNTER — Ambulatory Visit (INDEPENDENT_AMBULATORY_CARE_PROVIDER_SITE_OTHER): Payer: Medicare Other | Admitting: Podiatry

## 2017-01-10 VITALS — BP 160/88 | HR 90 | Resp 18

## 2017-01-10 DIAGNOSIS — M659 Synovitis and tenosynovitis, unspecified: Secondary | ICD-10-CM

## 2017-01-10 DIAGNOSIS — L84 Corns and callosities: Secondary | ICD-10-CM

## 2017-01-10 DIAGNOSIS — M2041 Other hammer toe(s) (acquired), right foot: Secondary | ICD-10-CM

## 2017-01-10 NOTE — Patient Instructions (Signed)

## 2017-01-13 ENCOUNTER — Encounter (INDEPENDENT_AMBULATORY_CARE_PROVIDER_SITE_OTHER): Payer: Self-pay | Admitting: Specialist

## 2017-01-13 ENCOUNTER — Ambulatory Visit (INDEPENDENT_AMBULATORY_CARE_PROVIDER_SITE_OTHER): Payer: Medicare Other | Admitting: Specialist

## 2017-01-13 VITALS — BP 157/87 | HR 83 | Ht 59.0 in | Wt 144.0 lb

## 2017-01-13 DIAGNOSIS — M47812 Spondylosis without myelopathy or radiculopathy, cervical region: Secondary | ICD-10-CM

## 2017-01-13 DIAGNOSIS — R202 Paresthesia of skin: Secondary | ICD-10-CM

## 2017-01-13 DIAGNOSIS — R200 Anesthesia of skin: Secondary | ICD-10-CM | POA: Diagnosis not present

## 2017-01-13 DIAGNOSIS — M542 Cervicalgia: Secondary | ICD-10-CM

## 2017-01-13 NOTE — Progress Notes (Signed)
Subjective: Madeline Harper presents the office today for concerns of pain to the right fifth toe she points along the area of the callus has been ongoing for some time. She said the toe is been hurting for some time and become very painful last month or so. She states that she cannot wear shoes because of pain and she has tried offloading without any significant improvement. She presents today for further evaluation. She states that she is doing very well on the left foot she is here he happy with the outcome of the surgery and the left foot. Denies any systemic complaints such as fevers, chills, nausea, vomiting. No acute changes since last appointment, and no other complaints at this time.   Objective: AAO x3, NAD DP/PT pulses palpable bilaterally, CRT less than 3 seconds Surgical site is well-healed on the left foot there is no pain or swelling. On the right foot there is adductovarus present of the fifth toe which is sitting in a rigid position and there is a symptomatic hyperkeratotic lesion present to the dorsal lateral aspect of the right fifth toe. There is tenderness palpation to this area and there is localized erythema from where it is rubbing inside shoes. There is no other areas of tenderness of the foot this time.  No open lesions or pre-ulcerative lesions.  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  -X-rays were obtained and reviewed today. I reviewed the x-rays with the patient. Adductovarus is present. No evidence of acute fracture. -I sharply debrided the hyperkeratotic lesion to the any complications or bleeding at her request. -Dispensed further offloading pads. -We discussed both conservative as well as surgical treatments. At this point she elected to pursue surgical invention  help straighten the toe as she has tried changing shoes and offloading without any significant improvement and she thinks this has been ongoing issue and I do as well. Discussed with her PIPJ arthroplasty the right fifth digit. Discussed the surgery as well as the postoperative course and she understands this and she wishes to proceed. -The incision placement as well as the postoperative course was discussed with the patient. I discussed risks of the surgery which include, but not limited to, infection, bleeding, pain, swelling, need for further surgery, delayed or nonhealing, painful or ugly scar, numbness or sensation changes, over/under correction, recurrence, transfer lesions, further deformity, DVT/PE, loss of toe/foot. Patient understands these risks and wishes to proceed with surgery. The surgical consent was reviewed with the patient all 3 pages were signed. No promises or guarantees were given to the outcome of the procedure. All questions were answered to the best of my ability. Before the surgery the patient was encouraged to call the office if there is any further questions. The surgery will be performed at the [facility] on an outpatient basis. -Patient encouraged to call the office with any questions, concerns, change in symptoms.   Ovid Curd, DPM

## 2017-01-13 NOTE — Patient Instructions (Addendum)
Avoid overhead lifting and overhead use of the arms. Do not lift greater than 5-10 lbs. Adjust head rest in vehicle to prevent hyperextension if rear ended.  Fall Prevention and Home Safety Falls cause injuries and can affect all age groups. It is possible to use preventive measures to significantly decrease the likelihood of falls. There are many simple measures which can make your home safer and prevent falls. OUTDOORS  Repair cracks and edges of walkways and driveways.  Remove high doorway thresholds.  Trim shrubbery on the main path into your home.  Have good outside lighting.  Clear walkways of tools, rocks, debris, and clutter.  Check that handrails are not broken and are securely fastened. Both sides of steps should have handrails.  Have leaves, snow, and ice cleared regularly.  Use sand or salt on walkways during winter months.  In the garage, clean up grease or oil spills. BATHROOM  Install night lights.  Install grab bars by the toilet and in the tub and shower.  Use non-skid mats or decals in the tub or shower.  Place a plastic non-slip stool in the shower to sit on, if needed.  Keep floors dry and clean up all water on the floor immediately.  Remove soap buildup in the tub or shower on a regular basis.  Secure bath mats with non-slip, double-sided rug tape.  Remove throw rugs and tripping hazards from the floors. BEDROOMS  Install night lights.  Make sure a bedside light is easy to reach.  Do not use oversized bedding.  Keep a telephone by your bedside.  Have a firm chair with side arms to use for getting dressed.  Remove throw rugs and tripping hazards from the floor. KITCHEN  Keep handles on pots and pans turned toward the center of the stove. Use back burners when possible.  Clean up spills quickly and allow time for drying.  Avoid walking on wet floors.  Avoid hot utensils and knives.  Position shelves so they are not too high or  low.  Place commonly used objects within easy reach.  If necessary, use a sturdy step stool with a grab bar when reaching.  Keep electrical cables out of the way.  Do not use floor polish or wax that makes floors slippery. If you must use wax, use non-skid floor wax.  Remove throw rugs and tripping hazards from the floor. STAIRWAYS  Never leave objects on stairs.  Place handrails on both sides of stairways and use them. Fix any loose handrails. Make sure handrails on both sides of the stairways are as long as the stairs.  Check carpeting to make sure it is firmly attached along stairs. Make repairs to worn or loose carpet promptly.  Avoid placing throw rugs at the top or bottom of stairways, or properly secure the rug with carpet tape to prevent slippage. Get rid of throw rugs, if possible.  Have an electrician put in a light switch at the top and bottom of the stairs. OTHER FALL PREVENTION TIPS  Wear low-heel or rubber-soled shoes that are supportive and fit well. Wear closed toe shoes.  When using a stepladder, make sure it is fully opened and both spreaders are firmly locked. Do not climb a closed stepladder.  Add color or contrast paint or tape to grab bars and handrails in your home. Place contrasting color strips on first and last steps.  Learn and use mobility aids as needed. Install an electrical emergency response system.  Turn on lights to  avoid dark areas. Replace light bulbs that burn out immediately. Get light switches that glow.  Arrange furniture to create clear pathways. Keep furniture in the same place.  Firmly attach carpet with non-skid or double-sided tape.  Eliminate uneven floor surfaces.  Select a carpet pattern that does not visually hide the edge of steps.  Be aware of all pets. OTHER HOME SAFETY TIPS  Set the water temperature for 120 F (48.8 C).  Keep emergency numbers on or near the telephone.  Keep smoke detectors on every level of the  home and near sleeping areas. Document Released: 03/15/2002 Document Revised: 09/24/2011 Document Reviewed: 06/14/2011 [facility] Patient Information 2014 [place], [place].

## 2017-01-13 NOTE — Progress Notes (Signed)
Office Visit Note   Patient: Madeline Harper           Date of Birth: 08-24-1950           MRN: [id number] Visit Date: 01/13/2017              Requested by: Pearline Cables, MD [address] [place], [place] [address] PCP: Pearline Cables, MD   Assessment & Plan: Visit Diagnoses:  1. Cervicalgia   2. Numbness and tingling in both hands   3. Spondylosis without myelopathy or radiculopathy, cervical region     Plan: Avoid overhead lifting and overhead use of the arms. Do not lift greater than 5-10 lbs. Adjust head rest in vehicle to prevent hyperextension if rear ended. Fall Prevention and Home Safety Falls cause injuries and can affect all age groups. It is possible to use preventive measures to significantly decrease the likelihood of falls. There are many simple measures which can make your home safer and prevent falls. OUTDOORS  Repair cracks and edges of walkways and driveways.  Remove high doorway thresholds.  Trim shrubbery on the main path into your home.  Have good outside lighting.  Clear walkways of tools, rocks, debris, and clutter.  Check that handrails are not broken and are securely fastened. Both sides of steps should have handrails.  Have leaves, snow, and ice cleared regularly.  Use sand or salt on walkways during winter months.  In the garage, clean up grease or oil spills. BATHROOM  Install night lights.  Install grab bars by the toilet and in the tub and shower.  Use non-skid mats or decals in the tub or shower.  Place a plastic non-slip stool in the shower to sit on, if needed.  Keep floors dry and clean up all water on the floor immediately.  Remove soap buildup in the tub or shower on a regular basis.  Secure bath mats with non-slip, double-sided rug tape.  Remove throw rugs and tripping hazards from the floors. BEDROOMS  Install night lights.  Make sure a bedside light is easy to reach.  Do not use oversized  bedding.  Keep a telephone by your bedside.  Have a firm chair with side arms to use for getting dressed.  Remove throw rugs and tripping hazards from the floor. KITCHEN  Keep handles on pots and pans turned toward the center of the stove. Use back burners when possible.  Clean up spills quickly and allow time for drying.  Avoid walking on wet floors.  Avoid hot utensils and knives.  Position shelves so they are not too high or low.  Place commonly used objects within easy reach.  If necessary, use a sturdy step stool with a grab bar when reaching.  Keep electrical cables out of the way.  Do not use floor polish or wax that makes floors slippery. If you must use wax, use non-skid floor wax.  Remove throw rugs and tripping hazards from the floor. STAIRWAYS  Never leave objects on stairs.  Place handrails on both sides of stairways and use them. Fix any loose handrails. Make sure handrails on both sides of the stairways are as long as the stairs.  Check carpeting to make sure it is firmly attached along stairs. Make repairs to worn or loose carpet promptly.  Avoid placing throw rugs at the top or bottom of stairways, or properly secure the rug with carpet tape to prevent slippage. Get rid of throw rugs, if  possible.  Have an electrician put in a light switch at the top and bottom of the stairs. OTHER FALL PREVENTION TIPS  Wear low-heel or rubber-soled shoes that are supportive and fit well. Wear closed toe shoes.  When using a stepladder, make sure it is fully opened and both spreaders are firmly locked. Do not climb a closed stepladder.  Add color or contrast paint or tape to grab bars and handrails in your home. Place contrasting color strips on first and last steps.  Learn and use mobility aids as needed. Install an electrical emergency response system.  Turn on lights to avoid dark areas. Replace light bulbs that burn out immediately. Get light switches that  glow.  Arrange furniture to create clear pathways. Keep furniture in the same place.  Firmly attach carpet with non-skid or double-sided tape.  Eliminate uneven floor surfaces.  Select a carpet pattern that does not visually hide the edge of steps.  Be aware of all pets. OTHER HOME SAFETY TIPS  Set the water temperature for 120 F (48.8 C).  Keep emergency numbers on or near the telephone.  Keep smoke detectors on every level of the home and near sleeping areas. Document Released: 03/15/2002 Document Revised: 09/24/2011 Document Reviewed: 06/14/2011 [facility] Patient Information 2014 [place], [place].  Follow-Up Instructions: Return in about 4 years (around 01/13/2021).   Orders:  No orders of the defined types were placed in this encounter.  No orders of the defined types were placed in this encounter.     Procedures: No procedures performed   Clinical Data: No additional findings.   Subjective: Chief Complaint  Patient presents with  . Neck - Pain    66 year old female, right handed with about a 6 month history of neck pain in the past more right than left and now it is more left lower neck around the base of the neck. She has been seen By Dr. Corliss Skains for a mixed connective tissue disease. She has had pain in her shoulders and underwent xrays in the past showing arthritis changes in her neck. She has stiffness in the  Neck with numbness and tingling in the arms especially at night with tingling in the left hand and left arm greater than right. Has undergone previous EMG/NCV at [facility], Dr. Joneen Boers, these reportedly were not severe. Had previous shoulder pain and underwent injection with Dr. Samuel Bouche into the shoulder with improved pain in the neck and shoulder. The injection was for subacromial bursitis.     Review of Systems  Constitutional: Positive for activity change, appetite change, fatigue and unexpected weight change.  HENT: Negative.   Eyes:  Negative.   Respiratory: Negative.   Cardiovascular: Negative.   Gastrointestinal: Negative.  Negative for abdominal distention, abdominal pain, anal bleeding, blood in stool, constipation, diarrhea and nausea.  Endocrine: Negative.   Genitourinary: Negative.   Musculoskeletal: Positive for arthralgias, joint swelling, myalgias, neck pain and neck stiffness.  Skin: Negative.  Negative for color change, pallor, rash and wound.  Allergic/Immunologic: Negative.   Neurological: Positive for weakness and numbness.  Hematological: Negative.   Psychiatric/Behavioral: Negative.  Negative for self-injury, sleep disturbance and suicidal ideas. The patient is not nervous/anxious.      Objective: Vital Signs: BP (!) 157/87 (BP Location: Left Arm, Patient Position: Sitting)   Pulse 83   Ht  (1.499 m)   Wt 144 lb (65.3 kg)   BMI 29.08 kg/m   Physical Exam  Constitutional: She is oriented to person,  place, and time. She appears well-developed and well-nourished.  HENT:  Head: Normocephalic and atraumatic.  Eyes: Pupils are equal, round, and reactive to light. EOM are normal.  Neck: Normal range of motion. Neck supple.  Pulmonary/Chest: Effort normal and breath sounds normal.  Abdominal: Soft. Bowel sounds are normal.  Neurological: She is alert and oriented to person, place, and time.  Skin: Skin is warm and dry.  Psychiatric: She has a normal mood and affect. Her behavior is normal. Judgment and thought content normal.    Back Exam   Tenderness  The patient is experiencing tenderness in the cervical and lumbar.  Range of Motion  Extension:  40 abnormal  Flexion: 70  Lateral Bend Right: 70  Lateral Bend Left: 60  Rotation Right: 50  Rotation Left: 50   Muscle Strength  Right Quadriceps:  5/5  Left Quadriceps:  5/5  Right Hamstrings:  5/5  Left Hamstrings:  5/5   Tests  Straight leg raise right: negative Straight leg raise left: negative  Reflexes  Patellar:  normal Achilles: normal Biceps: normal Babinski's sign: normal   Other  Toe Walk: normal Heel Walk: normal Sensation: normal Gait: normal  Erythema: no back redness Scars: absent   Right Hand Exam   Tests  Tinel's Sign (Medial Nerve): positive  Comments:  At median nerveat the wrist.   Left Hand Exam   Tests  Tinel's Sign (Medial Nerve): positive  Comments:  At medial nerve at the wrist.      Specialty Comments:  No specialty comments available.  Imaging: Dg Foot Complete Right  Result Date: 01/13/2017 Please see detailed radiograph report in office note.    PMFS History: Patient Active Problem List   Diagnosis Date Noted  . Mixed connective tissue disease (HCC) 07/12/2016  . 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 High risk medication use 05/28/2016  . Raynaud's disease without gangrene 05/28/2016  . Bilateral foot pain 03/27/2016  . Trigger index finger of right hand 03/27/2016  . Elevated sedimentation rate 02/28/2016  . Positive ANA (antinuclear antibody) 02/27/2016  . Bilateral shoulder pain 02/27/2016  . Status post left foot surgery 06/28/2015  . Bone spur of foot 06/14/2015   Past Medical History:  Diagnosis Date  . Autoimmune disease (HCC)   . Bone spur of foot   . Cataract     Family History  Problem Relation Age of Onset  . Diabetes Mother   . Heart disease Mother   . Kidney failure Mother   . Cancer Mother        Uterine Cancer  . Diabetes Maternal Grandmother   . Kidney failure Maternal Grandmother   . Stroke Sister   . Gout Maternal Grandfather     Past Surgical History:  Procedure Laterality Date  . CATARACT EXTRACTION, BILATERAL    . FOOT SURGERY    . KIDNEY STONE SURGERY  1972  . TUBAL LIGATION  1970   Social History   Occupational History  . Not on file.   Social History Main Topics  . Smoking status: Former Smoker    Packs/day: 2.00    Years: 15.00    Types: Cigarettes     Quit date: 04/08/1980  . Smokeless tobacco: Never Used  . Alcohol use 0.0 oz/week     Comment: Occ  . Drug use: No  . Sexual activity: Not on file

## 2017-01-15 NOTE — Progress Notes (Deleted)
Office Visit Note  Patient: Madeline Harper             Date of Birth: Aug 23, 1950           MRN: [id number]             PCP: Pearline Cables, MD Referring: Pearline Cables, MD Visit Date: 01/27/2017 Occupation: @    Subjective:  No chief complaint on file.   History of Present Illness: Madeline Harper is a 66 y.o. female ***   Activities of Daily Living:  Patient reports morning stiffness for *** {minute/hour:19697}.   Patient {ACTIONS;DENIES/REPORTS:21021675::"Denies"} nocturnal pain.  Difficulty dressing/grooming: {ACTIONS;DENIES/REPORTS:21021675::"Denies"} Difficulty climbing stairs: {ACTIONS;DENIES/REPORTS:21021675::"Denies"} Difficulty getting out of chair: {ACTIONS;DENIES/REPORTS:21021675::"Denies"} Difficulty using hands for taps, buttons, cutlery, and/or writing: {ACTIONS;DENIES/REPORTS:21021675::"Denies"}   No Rheumatology ROS completed.   PMFS History:  Patient Active Problem List   Diagnosis Date Noted  . Mixed connective tissue disease (HCC) 07/12/2016  . 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 High risk medication use 05/28/2016  . Raynaud's disease without gangrene 05/28/2016  . Bilateral foot pain 03/27/2016  . Trigger index finger of right hand 03/27/2016  . Elevated sedimentation rate 02/28/2016  . Positive ANA (antinuclear antibody) 02/27/2016  . Bilateral shoulder pain 02/27/2016  . Status post left foot surgery 06/28/2015  . Bone spur of foot 06/14/2015    Past Medical History:  Diagnosis Date  . Autoimmune disease (HCC)   . Bone spur of foot   . Cataract     Family History  Problem Relation Age of Onset  . Diabetes Mother   . Heart disease Mother   . Kidney failure Mother   . Cancer Mother        Uterine Cancer  . Diabetes Maternal Grandmother   . Kidney failure Maternal Grandmother   . Stroke Sister   . Gout Maternal Grandfather    Past Surgical History:  Procedure Laterality Date  .  CATARACT EXTRACTION, BILATERAL    . FOOT SURGERY    . KIDNEY STONE SURGERY  1972  . TUBAL LIGATION  1970   Social History   Social History Narrative   Lives with husband. Pt works in Airline pilot     Objective: Vital Signs: There were no vitals taken for this visit.   Physical Exam   Musculoskeletal Exam: ***  CDAI Exam: No CDAI exam completed.    Investigation: No additional findings. TB Gold: Indeterm 06/2016 chest CT: 05/28/16 PLQ eye exam: 07/2016 CBC Latest Ref Rng & Units 11/18/2016 09/18/2016 09/04/2016  WBC 3.8 - 10.8 K/uL 2.3(L) 4.3 4.0  Hemoglobin 11.7 - 15.5 g/dL 40.9 11.1(L) 10.6(L)  Hematocrit 35.0 - 45.0 % 36.4 35.2 34.0(L)  Platelets 140 - 400 K/uL 278 283 269   CMP Latest Ref Rng & Units 11/18/2016 09/18/2016 09/04/2016  Glucose 65 - 99 mg/dL 83 95 811(B)  BUN 7 - 25 mg/dL Creatinine 0.50 - 0.99 mg/dL 1.47 8.29 5.62  Sodium 135 - 146 mmol/L 139 140 138  Potassium 3.5 - 5.3 mmol/L 4.5 3.8 4.4  Chloride 98 - 110 mmol/L 104 107 106  CO2 20 - 32 mmol/L Calcium 8.6 - 10.4 mg/dL 9.3 8.9 9.3  Total Protein 6.1 - 8.1 g/dL 7.5 6.8 7.7  Total Bilirubin 0.2 - 1.2 mg/dL 0.4 0.4 0.4  Alkaline Phos 33 - 130 U/L 57 60 66  AST 10 - 35 U/L ALT 6 -  29 U/L Imaging: Dg Foot Complete Right  Result Date: 01/13/2017 Please see detailed radiograph report in office note.   Speciality Comments: No specialty comments available.    Procedures:  No procedures performed Allergies: Lyrica [pregabalin]   Assessment / Plan:     Visit Diagnoses: No diagnosis found.    Orders: No orders of the defined types were placed in this encounter.  No orders of the defined types were placed in this encounter.   Face-to-face time spent with patient was *** minutes. 50% of time was spent in counseling and coordination of care.  Follow-Up Instructions: No Follow-up on file.   Ellen Henri, NT  Note - This record has been created using  Animal nutritionist.  Chart creation errors have been sought, but may not always  have been located. Such creation errors do not reflect on  the standard of medical care.

## 2017-01-20 ENCOUNTER — Ambulatory Visit: Payer: Medicare Other | Admitting: Physical Therapy

## 2017-01-22 ENCOUNTER — Ambulatory Visit: Payer: Medicare Other | Admitting: Physical Therapy

## 2017-01-26 ENCOUNTER — Encounter: Payer: Self-pay | Admitting: Rheumatology

## 2017-01-26 DIAGNOSIS — M5030 Other cervical disc degeneration, unspecified cervical region: Secondary | ICD-10-CM

## 2017-01-26 HISTORY — DX: Other cervical disc degeneration, unspecified cervical region: M50.30

## 2017-01-27 ENCOUNTER — Ambulatory Visit: Payer: Medicare Other | Admitting: Rheumatology

## 2017-01-28 ENCOUNTER — Ambulatory Visit: Payer: Medicare Other | Attending: Rheumatology | Admitting: Physical Therapy

## 2017-01-29 ENCOUNTER — Ambulatory Visit (INDEPENDENT_AMBULATORY_CARE_PROVIDER_SITE_OTHER): Payer: Medicare Other | Admitting: Physical Medicine and Rehabilitation

## 2017-01-29 ENCOUNTER — Encounter (INDEPENDENT_AMBULATORY_CARE_PROVIDER_SITE_OTHER): Payer: Self-pay | Admitting: Physical Medicine and Rehabilitation

## 2017-01-29 DIAGNOSIS — R202 Paresthesia of skin: Secondary | ICD-10-CM

## 2017-01-29 NOTE — Progress Notes (Deleted)
Numbness in tips of fingers. Thumbs are the worst. Says ends of thumbs feel swollen sometimes. Worse when it is cold. Right hand dominant.

## 2017-01-30 ENCOUNTER — Other Ambulatory Visit: Payer: Self-pay | Admitting: Rheumatology

## 2017-01-31 ENCOUNTER — Encounter (INDEPENDENT_AMBULATORY_CARE_PROVIDER_SITE_OTHER): Payer: Self-pay | Admitting: Physical Medicine and Rehabilitation

## 2017-01-31 NOTE — Progress Notes (Signed)
Madeline KinsmanDiane Corallo - 66 y.o. female MRN [id number]  Date of birth: 1950-07-25  Office Visit Note: Visit Date: 01/29/2017 PCP: Pearline Cablesopland, Jessica C, MD Referred by: Pearline Cablesopland, Jessica C, MD  Subjective: Chief Complaint  Patient presents with  . Right Hand - Numbness  . Left Hand - Numbness   HPI: Madeline Harper is a 66 year old right-hand-dominant female followed by Dr. Weston BrassNick.  She is reporting bilateral hand pain and numbness and to arms.  He gets a lot of numbness in the tips of the fingers.  He feels like it is worse when it is cold.  Mom feels like her hand feels swollen.  She does get worsening nocturnal complaints.  She has not had prior electrodiagnostic studies.  She does have a history of a mixed tissue autoimmune disease.  He does have a history of Raynaud's syndrome.  Not have a history of diabetes.    ROS Otherwise per HPI.  Assessment & Plan: Visit Diagnoses:  1. Paresthesia of skin     Plan: No additional findings.  Impression: The above electrodiagnostic study is ABNORMAL and reveals evidence of:  1.  A severe left median nerve entrapment at the wrist (carpal tunnel syndrome) affecting sensory and motor components. The lesion is characterized by sensory and motor demyelination with evidence of axonal injury.   2.  A moderate right median nerve entrapment at the wrist (carpal tunnel syndrome) affecting sensory and motor components.   There is no significant electrodiagnostic evidence of any other focal nerve entrapment, brachial plexopathy or cervical radiculopathy.    As you know, this particular electrodiagnostic study cannot rule out chemical radiculitis or sensory only radiculopathy.  Recommendations: 1.  Follow-up with referring physician. 2.  Continue current management of symptoms.  Surgical evaluation.    Meds & Orders: No orders of the defined types were placed in this encounter.   Orders Placed This Encounter  Procedures  . NCV with EMG (electromyography)      Follow-up: Return for Dr. Otelia SergeantNitka.   Procedures: No procedures performed  EMG & NCV Findings: Evaluation of the left median motor nerve showed prolonged distal onset latency (6.4 ms), reduced amplitude (4.4 mV), and decreased conduction velocity (Elbow-Wrist, 48 m/s).  The right median motor nerve showed decreased conduction velocity (Elbow-Wrist, 48 m/s).  The left median (across palm) sensory and the right median (across palm) sensory nerves showed prolonged distal peak latency (Wrist, L5.6, R4.3 ms) and prolonged distal peak latency (Palm, L2.2, R2.3 ms).  The left ulnar sensory nerve showed reduced amplitude (14.8 V).  The right ulnar sensory nerve showed prolonged distal peak latency (3.8 ms), reduced amplitude (13.7 V), and decreased conduction velocity (Wrist-5th Digit, 37 m/s).  All remaining nerves (as indicated in the following tables) were within normal limits.  Left vs. Right side comparison data for the median motor nerve indicates abnormal L-R latency difference (2.2 ms).  The ulnar sensory nerve indicates abnormal L-R latency difference (0.5 ms).  All remaining left vs. right side differences were within normal limits.    Electrodiagnostic examination of the Left APB showed moderate denervation.  All other examined muscles (as indicated in the following table) showed no evidence of electrical instability.    Impression: The above electrodiagnostic study is ABNORMAL and reveals evidence of:  1.  A severe left median nerve entrapment at the wrist (carpal tunnel syndrome) affecting sensory and motor components. The lesion is characterized by sensory and motor demyelination with evidence of axonal injury.   2.  A  moderate right median nerve entrapment at the wrist (carpal tunnel syndrome) affecting sensory and motor components.   There is no significant electrodiagnostic evidence of any other focal nerve entrapment, brachial plexopathy or cervical radiculopathy.    As you know, this  particular electrodiagnostic study cannot rule out chemical radiculitis or sensory only radiculopathy.  Recommendations: 1.  Follow-up with referring physician. 2.  Continue current management of symptoms.  Surgical evaluation.   Nerve Conduction Studies Anti Sensory Summary Table   Stim Site NR Peak (ms) Norm Peak (ms) P-T Amp (V) Norm P-T Amp Site1 Site2 Delta-P (ms) Dist (cm) Vel (m/s) Norm Vel (m/s)  Left Median Acr Palm Anti Sensory (2nd Digit)  32.1C  Wrist    *5.6 <3.6 13.4 >10 Wrist Palm 3.4 0.0    Palm    *2.2 <2.0 2.3         Right Median Acr Palm Anti Sensory (2nd Digit)  32C  Wrist    *4.3 <3.6 20.9 >10 Wrist Palm 2.0 0.0    Palm    *2.3 <2.0 7.0         Left Radial Anti Sensory (Base 1st Digit)  32.6C  Wrist    2.2 <3.1 18.3  Wrist Base 1st Digit 2.2 0.0    Right Radial Anti Sensory (Base 1st Digit)  33.1C  Wrist    2.2 <3.1 20.6  Wrist Base 1st Digit 2.2 0.0    Left Ulnar Anti Sensory (5th Digit)  32.4C  Wrist    3.3 <3.7 *14.8 >15.0 Wrist 5th Digit 3.3 14.0 42 >38  Right Ulnar Anti Sensory (5th Digit)  32.5C  Wrist    *3.8 <3.7 *13.7 >15.0 Wrist 5th Digit 3.8 14.0 *37 >38   Motor Summary Table   Stim Site NR Onset (ms) Norm Onset (ms) O-P Amp (mV) Norm O-P Amp Site1 Site2 Delta-0 (ms) Dist (cm) Vel (m/s) Norm Vel (m/s)  Left Median Motor (Abd Poll Brev)  32.6C  Wrist    *6.4 <4.2 *4.4 >5 Elbow Wrist 4.0 19.0 *48 >50  Elbow    10.4  4.6         Right Median Motor (Abd Poll Brev)  33.1C  Wrist    4.2 <4.2 6.0 >5 Elbow Wrist 3.8 18.2 *48 >50  Elbow    8.0  5.0         Left Ulnar Motor (Abd Dig Min)  32.6C  Wrist    2.9 <4.2 8.9 >3 B Elbow Wrist 2.8 17.0 61 >53  B Elbow    5.7  8.4  A Elbow B Elbow 1.3 9.0 69 >53  A Elbow    7.0  8.0         Right Ulnar Motor (Abd Dig Min)  32.9C  Wrist    2.8 <4.2 8.8 >3 B Elbow Wrist 3.1 18.0 58 >53  B Elbow    5.9  8.6  A Elbow B Elbow 1.4 9.0 64 >53  A Elbow    7.3  7.8          EMG   Side Muscle Nerve Root Ins  Act Fibs Psw Amp Dur Poly Recrt Int Dennie Bible Comment  Left Abd Poll Brev Median C8-T1 Inc 2+ 2+ Nml Nml 0 Nml Nml   Left 1stDorInt Ulnar C8-T1 Nml Nml Nml Nml Nml 0 Nml Nml   Left PronatorTeres Median C6-7 Nml Nml Nml Nml Nml 0 Nml Nml   Left Biceps Musculocut C5-6 Nml Nml Nml Nml Nml 0 Nml  Nml   Left Deltoid Axillary C5-6 Nml Nml Nml Nml Nml 0 Nml Nml     Nerve Conduction Studies Anti Sensory Left/Right Comparison   Stim Site L Lat (ms) R Lat (ms) L-R Lat (ms) L Amp (V) R Amp (V) L-R Amp (%) Site1 Site2 L Vel (m/s) R Vel (m/s) L-R Vel (m/s)  Median Acr Palm Anti Sensory (2nd Digit)  32.1C  Wrist *5.6 *4.3 1.3 13.4 20.9 35.9 Wrist Palm     Palm *2.2 *2.3 0.1 2.3 7.0 67.1       Radial Anti Sensory (Base 1st Digit)  32.6C  Wrist 2.2 2.2 0.0 18.3 20.6 11.2 Wrist Base 1st Digit     Ulnar Anti Sensory (5th Digit)  32.4C  Wrist 3.3 *3.8 *0.5 *14.8 *13.7 7.4 Wrist 5th Digit 42 *37 5   Motor Left/Right Comparison   Stim Site L Lat (ms) R Lat (ms) L-R Lat (ms) L Amp (mV) R Amp (mV) L-R Amp (%) Site1 Site2 L Vel (m/s) R Vel (m/s) L-R Vel (m/s)  Median Motor (Abd Poll Brev)  32.6C  Wrist *6.4 4.2 *2.2 *4.4 6.0 26.7 Elbow Wrist *48 *48 0  Elbow 10.4 8.0 2.4 4.6 5.0 8.0       Ulnar Motor (Abd Dig Min)  32.6C  Wrist 2.9 2.8 0.1 8.9 8.8 1.1 B Elbow Wrist 61 58 3  B Elbow 5.7 5.9 0.2 8.4 8.6 2.3 A Elbow B Elbow 69 64 5  A Elbow 7.0 7.3 0.3 8.0 7.8 2.5          Waveforms:                     Clinical History: No specialty comments available.  She reports that she quit smoking about 36 years ago. Her smoking use included Cigarettes. She has a 30.00 pack-year smoking history. She has never used smokeless tobacco. No results for input(s): HGBA1C, LABURIC in the last 8760 hours.  Objective:  VS:  HT:    WT:   BMI:     BP:   HR: bpm  TEMP: ( )  RESP:  Physical Exam  Musculoskeletal:  Inspection reveals flattening of the left more than right APB but no atrophy of the bilateral FDI  or hand intrinsics. There is no swelling, color changes, allodynia or dystrophic changes.  No synovitis.  There is 5 out of 5 strength in the bilateral wrist extension, finger abduction and long finger flexion.  There is decreased sensation to light touch in the median nerve distribution left compared to right.      Ortho Exam Imaging: No results found.  Past Medical/Family/Surgical/Social History: Medications & Allergies reviewed per EMR Patient Active Problem List   Diagnosis Date Noted  . DDD (degenerative disc disease), cervical 01/26/2017  . Mixed connective tissue disease (HCC) 07/12/2016  . 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 High risk medication use 05/28/2016  . Raynaud's disease without gangrene 05/28/2016  . Bilateral foot pain 03/27/2016  . Trigger index finger of right hand 03/27/2016  . Elevated sedimentation rate 02/28/2016  . Positive ANA (antinuclear antibody) 02/27/2016  . Bilateral shoulder pain 02/27/2016  . Status post left foot surgery 06/28/2015  . Bone spur of foot 06/14/2015   Past Medical History:  Diagnosis Date  . Autoimmune disease (HCC)   . Bone spur of foot   . Cataract   . DDD (degenerative disc disease), cervical 01/26/2017   Family History  Problem Relation Age of Onset  . Diabetes Mother   . Heart disease Mother   . Kidney failure Mother   . Cancer Mother        Uterine Cancer  . Diabetes Maternal Grandmother   . Kidney failure Maternal Grandmother   . Stroke Sister   . Gout Maternal Grandfather    Past Surgical History:  Procedure Laterality Date  . CATARACT EXTRACTION, BILATERAL    . FOOT SURGERY    . KIDNEY STONE SURGERY  1972  . TUBAL LIGATION  1970   Social History   Occupational History  . Not on file.   Social History Main Topics  . Smoking status: Former Smoker    Packs/day: 2.00    Years: 15.00    Types: Cigarettes    Quit date: 04/08/1980  . Smokeless tobacco: Never Used  .  Alcohol use 0.0 oz/week     Comment: Occ  . Drug use: No  . Sexual activity: Not on file

## 2017-01-31 NOTE — Procedures (Signed)
EMG & NCV Findings: Evaluation of the left median motor nerve showed prolonged distal onset latency (6.4 ms), reduced amplitude (4.4 mV), and decreased conduction velocity (Elbow-Wrist, 48 m/s).  The right median motor nerve showed decreased conduction velocity (Elbow-Wrist, 48 m/s).  The left median (across palm) sensory and the right median (across palm) sensory nerves showed prolonged distal peak latency (Wrist, L5.6, R4.3 ms) and prolonged distal peak latency (Palm, L2.2, R2.3 ms).  The left ulnar sensory nerve showed reduced amplitude (14.8 V).  The right ulnar sensory nerve showed prolonged distal peak latency (3.8 ms), reduced amplitude (13.7 V), and decreased conduction velocity (Wrist-5th Digit, 37 m/s).  All remaining nerves (as indicated in the following tables) were within normal limits.  Left vs. Right side comparison data for the median motor nerve indicates abnormal L-R latency difference (2.2 ms).  The ulnar sensory nerve indicates abnormal L-R latency difference (0.5 ms).  All remaining left vs. right side differences were within normal limits.    Electrodiagnostic examination of the Left APB showed moderate denervation.  All other examined muscles (as indicated in the following table) showed no evidence of electrical instability.    Impression: The above electrodiagnostic study is ABNORMAL and reveals evidence of:  1.  A severe left median nerve entrapment at the wrist (carpal tunnel syndrome) affecting sensory and motor components. The lesion is characterized by sensory and motor demyelination with evidence of axonal injury.   2.  A moderate right median nerve entrapment at the wrist (carpal tunnel syndrome) affecting sensory and motor components.   There is no significant electrodiagnostic evidence of any other focal nerve entrapment, brachial plexopathy or cervical radiculopathy.    As you know, this particular electrodiagnostic study cannot rule out chemical radiculitis or  sensory only radiculopathy.  Recommendations: 1.  Follow-up with referring physician. 2.  Continue current management of symptoms.  Surgical evaluation.   Nerve Conduction Studies Anti Sensory Summary Table   Stim Site NR Peak (ms) Norm Peak (ms) P-T Amp (V) Norm P-T Amp Site1 Site2 Delta-P (ms) Dist (cm) Vel (m/s) Norm Vel (m/s)  Left Median Acr Palm Anti Sensory (2nd Digit)  32.1C  Wrist    *5.6 <3.6 13.4 >10 Wrist Palm 3.4 0.0    Palm    *2.2 <2.0 2.3         Right Median Acr Palm Anti Sensory (2nd Digit)  32C  Wrist    *4.3 <3.6 20.9 >10 Wrist Palm 2.0 0.0    Palm    *2.3 <2.0 7.0         Left Radial Anti Sensory (Base 1st Digit)  32.6C  Wrist    2.2 <3.1 18.3  Wrist Base 1st Digit 2.2 0.0    Right Radial Anti Sensory (Base 1st Digit)  33.1C  Wrist    2.2 <3.1 20.6  Wrist Base 1st Digit 2.2 0.0    Left Ulnar Anti Sensory (5th Digit)  32.4C  Wrist    3.3 <3.7 *14.8 >15.0 Wrist 5th Digit 3.3 14.0 42 >38  Right Ulnar Anti Sensory (5th Digit)  32.5C  Wrist    *3.8 <3.7 *13.7 >15.0 Wrist 5th Digit 3.8 14.0 *37 >38   Motor Summary Table   Stim Site NR Onset (ms) Norm Onset (ms) O-P Amp (mV) Norm O-P Amp Site1 Site2 Delta-0 (ms) Dist (cm) Vel (m/s) Norm Vel (m/s)  Left Median Motor (Abd Poll Brev)  32.6C  Wrist    *6.4 <4.2 *4.4 >5 Elbow Wrist 4.0 19.0 *48 >  50  Elbow    10.4  4.6         Right Median Motor (Abd Poll Brev)  33.1C  Wrist    4.2 <4.2 6.0 >5 Elbow Wrist 3.8 18.2 *48 >50  Elbow    8.0  5.0         Left Ulnar Motor (Abd Dig Min)  32.6C  Wrist    2.9 <4.2 8.9 >3 B Elbow Wrist 2.8 17.0 61 >53  B Elbow    5.7  8.4  A Elbow B Elbow 1.3 9.0 69 >53  A Elbow    7.0  8.0         Right Ulnar Motor (Abd Dig Min)  32.9C  Wrist    2.8 <4.2 8.8 >3 B Elbow Wrist 3.1 18.0 58 >53  B Elbow    5.9  8.6  A Elbow B Elbow 1.4 9.0 64 >53  A Elbow    7.3  7.8          EMG   Side Muscle Nerve Root Ins Act Fibs Psw Amp Dur Poly Recrt Int Dennie BiblePat Comment  Left Abd Poll Brev Median  C8-T1 Inc 2+ 2+ Nml Nml 0 Nml Nml   Left 1stDorInt Ulnar C8-T1 Nml Nml Nml Nml Nml 0 Nml Nml   Left PronatorTeres Median C6-7 Nml Nml Nml Nml Nml 0 Nml Nml   Left Biceps Musculocut C5-6 Nml Nml Nml Nml Nml 0 Nml Nml   Left Deltoid Axillary C5-6 Nml Nml Nml Nml Nml 0 Nml Nml     Nerve Conduction Studies Anti Sensory Left/Right Comparison   Stim Site L Lat (ms) R Lat (ms) L-R Lat (ms) L Amp (V) R Amp (V) L-R Amp (%) Site1 Site2 L Vel (m/s) R Vel (m/s) L-R Vel (m/s)  Median Acr Palm Anti Sensory (2nd Digit)  32.1C  Wrist *5.6 *4.3 1.3 13.4 20.9 35.9 Wrist Palm     Palm *2.2 *2.3 0.1 2.3 7.0 67.1       Radial Anti Sensory (Base 1st Digit)  32.6C  Wrist 2.2 2.2 0.0 18.3 20.6 11.2 Wrist Base 1st Digit     Ulnar Anti Sensory (5th Digit)  32.4C  Wrist 3.3 *3.8 *0.5 *14.8 *13.7 7.4 Wrist 5th Digit 42 *37 5   Motor Left/Right Comparison   Stim Site L Lat (ms) R Lat (ms) L-R Lat (ms) L Amp (mV) R Amp (mV) L-R Amp (%) Site1 Site2 L Vel (m/s) R Vel (m/s) L-R Vel (m/s)  Median Motor (Abd Poll Brev)  32.6C  Wrist *6.4 4.2 *2.2 *4.4 6.0 26.7 Elbow Wrist *48 *48 0  Elbow 10.4 8.0 2.4 4.6 5.0 8.0       Ulnar Motor (Abd Dig Min)  32.6C  Wrist 2.9 2.8 0.1 8.9 8.8 1.1 B Elbow Wrist 61 58 3  B Elbow 5.7 5.9 0.2 8.4 8.6 2.3 A Elbow B Elbow 69 64 5  A Elbow 7.0 7.3 0.3 8.0 7.8 2.5          Waveforms:

## 2017-02-04 ENCOUNTER — Other Ambulatory Visit: Payer: Self-pay | Admitting: Rheumatology

## 2017-02-05 ENCOUNTER — Ambulatory Visit: Payer: Medicare Other | Attending: Specialist | Admitting: Physical Therapy

## 2017-02-05 DIAGNOSIS — R29898 Other symptoms and signs involving the musculoskeletal system: Secondary | ICD-10-CM | POA: Insufficient documentation

## 2017-02-05 DIAGNOSIS — R293 Abnormal posture: Secondary | ICD-10-CM | POA: Diagnosis not present

## 2017-02-05 DIAGNOSIS — M542 Cervicalgia: Secondary | ICD-10-CM | POA: Insufficient documentation

## 2017-02-05 NOTE — Therapy (Signed)
[facility] [address]  [address] [place], [place], [address] Phone: [phone]   Fax:  [phone]  Physical Therapy Evaluation  Patient Details  Name: Madeline KinsmanDiane Havener MRN: [id number] Date of Birth: 06/23/1950 Referring Provider: Dr. Vira BrownsJames Nitka  Encounter Date: 02/05/2017      PT End of Session - 02/05/17 1635    Visit Number 1   Number of Visits 12   Date for PT Re-Evaluation 03/19/17   Authorization Type Medicare   PT Start Time 1600   PT Stop Time 1648   PT Time Calculation (min) 48 min   Activity Tolerance Patient tolerated treatment well   Behavior During Therapy [facility] for tasks assessed/performed      Past Medical History:  Diagnosis Date  . Autoimmune disease (HCC)   . Bone spur of foot   . Cataract   . DDD (degenerative disc disease), cervical 01/26/2017    Past Surgical History:  Procedure Laterality Date  . CATARACT EXTRACTION, BILATERAL    . FOOT SURGERY    . KIDNEY STONE SURGERY  1972  . TUBAL LIGATION  1970    There were no vitals filed for this visit.       Subjective Assessment - 02/05/17 1601    Subjective Saw MD for neck pain. When patient turns to L - reports stiffness. Reports reduced pain with tylenol. Pain with extension. Feels like she walks with her head down. Shoulders have been "okay". Pain seemingly localized to neck. Reports N&T into hands - constantly everyday. Reports B thumb swelling. Denies tripping/falls.    Pertinent History Mixed connective tissue disorder (controlled by medication)   Diagnostic tests Xray: arthritis per patient    Patient Stated Goals improve pain and function   Currently in Pain? Yes   Pain Score 4    Pain Location Neck   Pain Orientation Left;Posterior   Pain Descriptors / Indicators Aching;Constant;Tightness   Pain Type Chronic pain   Pain Onset More than a month ago   Pain Frequency Intermittent   Aggravating Factors  extension, L rotation   Pain Relieving Factors Tylenol            [facility] PT Assessment - 02/05/17 1606      Assessment   Medical Diagnosis cervicalgia, spondylosis wihtout myelopathy or radiculopathy, cervical region   Referring Provider Dr. Vira BrownsJames Nitka   Onset Date/Surgical Date --  >1 year   Next MD Visit 02/12/17   Prior Therapy yes - B shoulder pain     Precautions   Precautions None     Restrictions   Weight Bearing Restrictions No     Balance Screen   Has the patient fallen in the past 6 months No   Has the patient had a decrease in activity level because of a fear of falling?  No   Is the patient reluctant to leave their home because of a fear of falling?  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      Cognition   Overall Cognitive Status Within Functional Limits for tasks assessed     Observation/Other Assessments   Focus on Therapeutic Outcomes (FOTO)  Neck: 55 (45% limited, predicted 33% limited)  Sensation   Light Touch Impaired by gross assessment  patient reporting B hand numbness     Coordination   Gross Motor Movements are Fluid and Coordinated Yes     Posture/Postural Control   Posture/Postural Control Postural limitations   Postural Limitations Rounded Shoulders;Forward head     ROM / Strength   AROM / PROM / Strength AROM;Strength     AROM   Overall AROM Comments B UE WNL and painfree motion   AROM Assessment Site Cervical   Cervical Flexion 18   Cervical Extension 35  painful   Cervical - Right Side Bend 34   Cervical - Left Side Bend 34   Cervical - Right Rotation 58   Cervical - Left Rotation 35  "sore"     Strength   Overall Strength Comments B UE gross strength 4-/5 - some pain with resisted abduction     Palpation    Spinal mobility C1/C2 flexion/rotation - tightness L>R, C4-C5 tenderness with hypomobility   Palpation comment TTP to B UT, B LS, B cervical paraspinals            Objective measurements completed on examination: See above findings.          [facility] Adult PT Treatment/Exercise - 02/05/17 1606      Exercises   Exercises Neck     Neck Exercises: Seated   Neck Retraction 5 reps;5 secs   Neck Retraction Limitations supine and seated   Other Seated Exercise scap retraction 10 x 5 sec     Manual Therapy   Manual Therapy Soft tissue mobilization;Myofascial release   Manual therapy comments patient supine with bolster   Soft tissue mobilization STM to B cervical and upper back musculature with manal stretching to B UT and B LS   Myofascial Release manual trigger point release to R cervical paraspinals and B UT     Neck Exercises: Stretches   Upper Trapezius Stretch 3 reps;30 seconds   Upper Trapezius Stretch Limitations manual by PT   Levator Stretch 3 reps;30 seconds   Levator Stretch Limitations manual by PT   Other Neck Stretches mid doorway stretch 3 x 30 seconds                PT Education - 02/05/17 1634    Education provided Yes   Education Details exam findings, POC, initial HEP   Person(s) Educated Patient   Methods Explanation;Demonstration;Handout   Comprehension Verbalized understanding;Returned demonstration          PT Short Term Goals - 02/05/17 1711      PT SHORT TERM GOAL #1   Title patient to be independent with initial HEP   Status New   Target Date 02/26/17           PT Long Term Goals - 02/05/17 1712      PT LONG TERM GOAL #1   Title patient to be independent with advanced HEP   Status New   Target Date 03/19/17     PT LONG TERM GOAL #2   Title patient to demonstrate AROM of cervical spine to [facility], symmetrical and pain no greater than 2/10    Status New   Target Date 03/19/17     PT LONG TERM GOAL #3   Title patient to  demosntrate improved tissue quality as noted by reduced TTP and fewer active trigger points   Status New   Target Date 03/19/17     PT LONG TERM GOAL #4  Title patient to report ability to return to exercise regimen without pain   Status New   Target Date 03/19/17     PT LONG TERM GOAL #5   Title patient to report ability to perform ADLs and household work without an increase in pain   Status New   Target Date 03/19/17                Plan - March 04, 2017 1707    Clinical Impression Statement Patient is a 66 y/o female presenting to OPPT today regarding primary reports of neck pain, with most pain provcation with L rotation and extension. Patient also reporting B N&T into hands/fingers, of which patient reports possible dx of CTS. Patient today with limited AROM at cervical spine, TTP at B cervical and upper back musculature as well as difficulty with ADLS and daily tasks such as turning head while driving. Patient today given initial HEP for gentle strengthening and stretching with good tolerance and carryover. Patient to benefit from PT to address the above listed deficits to allow for improved functional mobility and QOL.    Clinical Presentation Evolving   Clinical Presentation due to: patient reporting general fatigue, mixed connective tissue disorder, B shoulder pain   Clinical Decision Making Moderate   Rehab Potential Good   PT Frequency 2x / week   PT Duration 6 weeks   PT Treatment/Interventions ADLs/Self Care Home Management;Cryotherapy;Electrical Stimulation;Iontophoresis 4mg /ml Dexamethasone;Moist Heat;Traction;Ultrasound;Neuromuscular re-education;Therapeutic exercise;Therapeutic activities;Patient/family education;Manual techniques;Passive range of motion;Vasopneumatic Device;Taping;Dry needling   PT Next Visit Plan gentle stretching and strengthening   Consulted and Agree with Plan of Care Patient      Patient will benefit from skilled therapeutic intervention in  order to improve the following deficits and impairments:  Pain, Decreased range of motion, Postural dysfunction, Impaired UE functional use  Visit Diagnosis: Cervicalgia - Plan: PT plan of care cert/re-cert  Abnormal posture - Plan: PT plan of care cert/re-cert  Other symptoms and signs involving the musculoskeletal system - Plan: PT plan of care cert/re-cert      G-Codes - 2017/03/04 1638    Functional Assessment Tool Used (Outpatient Only) FOTO: 55 (45% limited)   Functional Limitation Changing and maintaining body position   Changing and Maintaining Body Position Current Status (Z6109) At least 40 percent but less than 60 percent impaired, limited or restricted   Changing and Maintaining Body Position Goal Status (U0454) At least 20 percent but less than 40 percent impaired, limited or restricted       Problem List Patient Active Problem List   Diagnosis Date Noted  . DDD (degenerative disc disease), cervical 01/26/2017  . Mixed connective tissue disease (HCC) 07/12/2016  . 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 High risk medication use 05/28/2016  . Raynaud's disease without gangrene 05/28/2016  . Bilateral foot pain 03/27/2016  . Trigger index finger of right hand 03/27/2016  . Elevated sedimentation rate 02/28/2016  . Positive ANA (antinuclear antibody) 02/27/2016  . Bilateral shoulder pain 02/27/2016  . Status post left foot surgery 06/28/2015  . Bone spur of foot 06/14/2015      Kipp Laurence, PT, DPT 03/04/2017 5:25 PM   [facility] [address]  [address] [place], [place], [address] Phone: [phone]   Fax:  [phone]  Name: Minh Jasper MRN: [id number] Date of Birth: March 05, 1951

## 2017-02-05 NOTE — Patient Instructions (Signed)
Adduction (Active)   Maintaining erect posture, draw shoulders back while bringing elbows back and inward. Repeat __15__ times. Do __2-3__ sessions per day.  Flexibility: Upper Trapezius Stretch   Gently grasp right side of head while reaching behind back with other hand. Tilt head away until a gentle stretch is felt. Hold __30__ seconds. Repeat __3__ times per set.   Levator Scapula Stretch, Sitting   Sit, one hand tucked under hip on side to be stretched, other hand over top of head. Turn head toward other side and look down. Use hand on head to gently stretch neck in that position. Hold __30_ seconds. Repeat _3__ times per session.   Axial Extension (Chin Tuck)   Pull chin in and lengthen back of neck. Hold __5-10__ seconds while counting out loud. Repeat __10-15__ times. Do _2-3___ sessions per day.  CHEST: Doorway, Bilateral - Standing   Standing in doorway, place hands on wall with elbows bent at shoulder height. Lean forward. Hold __20-30_ seconds. _3__ reps per set

## 2017-02-07 ENCOUNTER — Ambulatory Visit: Payer: Medicare Other | Attending: Rheumatology

## 2017-02-07 DIAGNOSIS — M542 Cervicalgia: Secondary | ICD-10-CM | POA: Insufficient documentation

## 2017-02-07 DIAGNOSIS — R29898 Other symptoms and signs involving the musculoskeletal system: Secondary | ICD-10-CM | POA: Insufficient documentation

## 2017-02-07 DIAGNOSIS — R293 Abnormal posture: Secondary | ICD-10-CM | POA: Insufficient documentation

## 2017-02-07 NOTE — Therapy (Signed)
[facility] [place] [address]  [address] [place], [place], [address] Phone: [phone]   Fax:  [phone]  Physical Therapy Treatment  Patient Details  Name: Madeline KinsmanDiane Harper MRN: [id number] Date of Birth: 27-Mar-1951 Referring Provider: Dr. Vira BrownsJames Nitka  Encounter Date: 02/07/2017      PT End of Session - 02/07/17 0850    Visit Number 2   Number of Visits 12   Date for PT Re-Evaluation 03/19/17   Authorization Type Medicare   PT Start Time 0841   PT Stop Time 0914   PT Time Calculation (min) 33 min   Activity Tolerance Patient tolerated treatment well   Behavior During Therapy [facility] for tasks assessed/performed      Past Medical History:  Diagnosis Date  . Autoimmune disease (HCC)   . Bone spur of foot   . Cataract   . DDD (degenerative disc disease), cervical 01/26/2017    Past Surgical History:  Procedure Laterality Date  . CATARACT EXTRACTION, BILATERAL    . FOOT SURGERY    . KIDNEY STONE SURGERY  1972  . TUBAL LIGATION  1970    There were no vitals filed for this visit.      Subjective Assessment - 02/07/17 0844    Subjective Pt. reporting "cramp" in R shoulder blade area earlier today which subsided.  Pt. reporting she needs to leave for work at 9:15am.     Patient Stated Goals improve pain and function   Currently in Pain? Yes   Pain Score 4    Pain Location Neck   Pain Orientation Left;Posterior   Pain Descriptors / Indicators Tightness   Pain Type Chronic pain   Aggravating Factors  extension, L rotation    Multiple Pain Sites No                         [facility] Adult PT Treatment/Exercise - 02/07/17 0924      Neck Exercises: Machines for Strengthening   UBE (Upper Arm Bike) Lvl 1.0, 2 min forward/backward     Neck Exercises: Standing   Neck Retraction 5 reps;5 secs   Neck Retraction Limitations good technique      Shoulder Exercises: Standing   Retraction 15 reps  5" hold     Retraction Limitations at doorseal for postural cueing      Manual Therapy   Manual Therapy Soft tissue mobilization;Myofascial release   Manual therapy comments patient supine with bolster and seated   Soft tissue mobilization STM to B cervical and upper back musculature with manal stretching to B UT and B LS   Myofascial Release TPR to R UT, R medial scapular border, R-sided cervical paraspinals with good tolerance      Neck Exercises: Stretches   Upper Trapezius Stretch 2 reps;30 seconds  cueing for hold time    Upper Trapezius Stretch Limitations no overpressure    Levator Stretch 2 reps;30 seconds  cueing for full hold time    Levator Stretch Limitations no overpressure    Other Neck Stretches mid doorway stretch 2 x 30 seconds                  PT Short Term Goals - 02/07/17 0851      PT SHORT TERM GOAL #1   Title patient to be independent with initial HEP   Status On-going           PT Long Term Goals - 02/07/17  1610      PT LONG TERM GOAL #1   Title patient to be independent with advanced HEP   Status On-going     PT LONG TERM GOAL #2   Title patient to demonstrate AROM of cervical spine to [facility], symmetrical and pain no greater than 2/10    Status On-going     PT LONG TERM GOAL #3   Title patient to demosntrate improved tissue quality as noted by reduced TTP and fewer active trigger points   Status On-going     PT LONG TERM GOAL #4   Title patient to report ability to return to exercise regimen without pain   Status On-going     PT LONG TERM GOAL #5   Title patient to report ability to perform ADLs and household work without an increase in pain   Status On-going               Plan - 02/07/17 0915    Clinical Impression Statement Pt. needing to leave early for work thus session time limited today.  Reports continue discomfort with neck extension and rotation to L side with driving.  Pt. with good overall understanding of HEP.  Had  difficulty relaxing cervical/shoulder musculature with manual STM today however with good relief reported following STM.  Pt. ttp along R medial scapular border and B UT, cervical paraspinals today with STM and would benefit from further manual work in this area to decrease tone and tenderness.  Pt. instructed in use of moist heat at home for relaxation of musculature today with rice-in-sock.  Did not initiate strengthening activities due to time constraints today however will plan to in upcoming visits.      PT Treatment/Interventions ADLs/Self Care Home Management;Cryotherapy;Electrical Stimulation;Iontophoresis 4mg /ml Dexamethasone;Moist Heat;Traction;Ultrasound;Neuromuscular re-education;Therapeutic exercise;Therapeutic activities;Patient/family education;Manual techniques;Passive range of motion;Vasopneumatic Device;Taping;Dry needling   PT Next Visit Plan gentle stretching and strengthening      Patient will benefit from skilled therapeutic intervention in order to improve the following deficits and impairments:  Pain, Decreased range of motion, Postural dysfunction, Impaired UE functional use  Visit Diagnosis: Cervicalgia  Abnormal posture  Other symptoms and signs involving the musculoskeletal system     Problem List Patient Active Problem List   Diagnosis Date Noted  . DDD (degenerative disc disease), cervical 01/26/2017  . Mixed connective tissue disease (HCC) 07/12/2016  . 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 High risk medication use 05/28/2016  . Raynaud's disease without gangrene 05/28/2016  . Bilateral foot pain 03/27/2016  . Trigger index finger of right hand 03/27/2016  . Elevated sedimentation rate 02/28/2016  . Positive ANA (antinuclear antibody) 02/27/2016  . Bilateral shoulder pain 02/27/2016  . Status post left foot surgery 06/28/2015  . Bone spur of foot 06/14/2015    Kermit Balo, PTA 02/07/17 9:29 AM  [facility] [address]  [address] [place], [place], [address] Phone: [phone]   Fax:  [phone]  Name: Madeline Harper MRN: [id number] Date of Birth: [birth year]

## 2017-02-11 ENCOUNTER — Ambulatory Visit: Payer: Medicare Other | Admitting: Physical Therapy

## 2017-02-11 ENCOUNTER — Encounter: Payer: Self-pay | Admitting: Physical Therapy

## 2017-02-11 DIAGNOSIS — R293 Abnormal posture: Secondary | ICD-10-CM

## 2017-02-11 DIAGNOSIS — R29898 Other symptoms and signs involving the musculoskeletal system: Secondary | ICD-10-CM

## 2017-02-11 DIAGNOSIS — M542 Cervicalgia: Secondary | ICD-10-CM | POA: Diagnosis not present

## 2017-02-11 NOTE — Therapy (Addendum)
[facility] [address]  [place], [place], [address] Phone: [phone]   Fax:  [phone]  Physical Therapy Treatment  Patient Details  Name: Madeline Harper MRN: [id number] Date of Birth: 10-26-1950 Referring Provider: Dr. Basil Dess   Encounter Date: 02/11/2017  PT End of Session - 02/11/17 1538    Visit Number  3    Number of Visits  12    Date for PT Re-Evaluation  03/19/17    Authorization Type  Medicare    PT Start Time  1537    PT Stop Time  1615    PT Time Calculation (min)  38 min    Activity Tolerance  Patient tolerated treatment well    Behavior During Therapy  [facility] for tasks assessed/performed       Past Medical History:  Diagnosis Date  . Autoimmune disease ([place])   . Bone spur of foot   . Cataract   . DDD (degenerative disc disease), cervical 01/26/2017    Past Surgical History:  Procedure Laterality Date  . CATARACT EXTRACTION, BILATERAL    . FOOT SURGERY    . [place]  . TUBAL LIGATION  1970    There were no vitals filed for this visit.  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  No/denies    Pain Score  0-No pain up to 3-4/10 with rotational movements   up to 3-4/10 with rotational movements                     [facility] Adult PT Treatment/Exercise - 02/11/17 1542      Neck Exercises: Machines for Strengthening   UBE (Upper Arm Bike)  L2 x 6 min (3/3)      Neck Exercises: Theraband   Shoulder Extension  15 reps;Red    Rows  15 reps;Red    Shoulder External Rotation  10 reps;Red    Shoulder External Rotation Limitations  hooklying on foam roll    Horizontal  ABduction  10 reps;Red    Horizontal ABduction Limitations  hooklying on foam roll      Shoulder Exercises: Stretch   Other Shoulder Stretches  pec stretch over foam roller      Manual Therapy   Manual Therapy  Soft tissue mobilization;Myofascial release;Passive ROM    Manual therapy comments  patient supine    Soft tissue mobilization  STM to B cervical paraspinals, B UT, B LS, B scalenes    Myofascial Release  manual trigger point release to L UT, L scalenes    Passive ROM  manual B UT stretching 2 x 20 sec each side               PT Short Term Goals - 02/07/17 2778      PT SHORT TERM GOAL #1   Title  patient to be independent with initial HEP    Status  On-going        PT Long Term Goals - 02/07/17 0856      PT LONG TERM GOAL #1   Title  patient to be independent with  advanced HEP    Status  On-going      PT LONG TERM GOAL #2   Title  patient to demonstrate AROM of cervical spine to [facility], symmetrical and pain no greater than 2/10     Status  On-going      PT LONG TERM GOAL #3   Title  patient to demosntrate improved tissue quality as noted by reduced TTP and fewer active trigger points    Status  On-going      PT LONG TERM GOAL #4   Title  patient to report ability to return to exercise regimen without pain    Status  On-going      PT LONG TERM GOAL #5   Title  patient to report ability to perform ADLs and household work without an increase in pain    Status  On-going            Plan - 02/11/17 [place]  Rosanne doing well today - very minimal pain. Patient tolerable to all STm with noted tightness in cervical and upper back musculature with good improvements following STM. ABle to begin more periscapular strengthening today wtih no issue. Some difficulty isoloating scap squeeze, as patient prefers to include cervical flexion. Will continue to progress as patient tolerates.     PT Treatment/Interventions  ADLs/Self Care Home  Management;Cryotherapy;Electrical Stimulation;Iontophoresis 60m/ml Dexamethasone;Moist Heat;Traction;Ultrasound;Neuromuscular re-education;Therapeutic exercise;Therapeutic activities;Patient/family education;Manual techniques;Passive range of motion;Vasopneumatic Device;Taping;Dry needling    PT Next Visit Plan  gentle stretching and strengthening    Consulted and Agree with Plan of Care  Patient       Patient will benefit from skilled therapeutic intervention in order to improve the following deficits and impairments:  Pain, Decreased range of motion, Postural dysfunction, Impaired UE functional use  Visit Diagnosis: Cervicalgia  Abnormal posture  Other symptoms and signs involving the musculoskeletal system  G-Codes -    Functional Assessment Tool Used (Outpatient Only) Clinical reasoning   Functional Limitation Changing and maintaining body position   Changing and Maintaining Body Position Goal Status ((F0263 At least 20 percent but less than 40 percent impaired, limited or restricted   Changing and Maintaining Body Position Discharge Status (([id number] At least 20 percent but less than 40 percent impaired, limited or restricted       Problem List Patient Active Problem List   Diagnosis Date Noted  . DDD (degenerative disc disease), cervical 01/26/2017  . Mixed connective tissue disease ([place] 07/12/2016  . [place]IP (usual [place]onitis) ([place] [facility]  . High risk medication use 05/28/2016  . Raynaud's disease without gangrene 05/28/2016  . Bilateral foot pain 03/27/2016  . Trigger index finger of right hand 03/27/2016  . Elevated sedimentation rate 02/28/2016  . Positive ANA (antinuclear antibody) 02/27/2016  . Bilateral shoulder pain 02/27/2016  . Status post left foot surgery 06/28/2015  . Bone spur of foot 06/14/2015     SLanney Gins PT, DPT 02/11/17 4:54 PM    PHYSICAL THERAPY DISCHARGE SUMMARY  Visits from Start of Care:  3  Current functional level related to goals / functional outcomes: See above   Remaining deficits: See above; unable to assess goals as patient did not return to PT following initial visits   Education / Equipment: HEP  Plan: Patient agrees to discharge.  Patient goals were not met. Patient is being discharged due to not returning since the last visit.  ?????    SLanney Gins  PT, DPT 04/07/17 8:44 AM   [facility] [address]  [place], [place], [address] Phone: [phone]   Fax:  [phone]  Name: Carmeline Kowal MRN: [id number] Date of Birth: 08/16/1950

## 2017-02-12 ENCOUNTER — Ambulatory Visit (INDEPENDENT_AMBULATORY_CARE_PROVIDER_SITE_OTHER): Payer: Medicare Other | Admitting: Specialist

## 2017-02-14 ENCOUNTER — Ambulatory Visit: Payer: Medicare Other | Admitting: Physical Therapy

## 2017-02-17 ENCOUNTER — Encounter (INDEPENDENT_AMBULATORY_CARE_PROVIDER_SITE_OTHER): Payer: Self-pay

## 2017-02-17 ENCOUNTER — Ambulatory Visit (INDEPENDENT_AMBULATORY_CARE_PROVIDER_SITE_OTHER): Payer: Medicare Other | Admitting: Specialist

## 2017-02-17 ENCOUNTER — Encounter: Payer: Medicare Other | Admitting: Podiatry

## 2017-02-24 ENCOUNTER — Encounter: Payer: Medicare Other | Admitting: Podiatry

## 2017-03-03 ENCOUNTER — Emergency Department (HOSPITAL_BASED_OUTPATIENT_CLINIC_OR_DEPARTMENT_OTHER): Payer: Medicare Other

## 2017-03-03 ENCOUNTER — Other Ambulatory Visit: Payer: Self-pay

## 2017-03-03 ENCOUNTER — Emergency Department (HOSPITAL_BASED_OUTPATIENT_CLINIC_OR_DEPARTMENT_OTHER)
Admission: EM | Admit: 2017-03-03 | Discharge: 2017-03-03 | Disposition: A | Discharge status: Home / Self Care | Payer: Medicare Other | Attending: Emergency Medicine | Admitting: Emergency Medicine

## 2017-03-03 ENCOUNTER — Encounter (HOSPITAL_BASED_OUTPATIENT_CLINIC_OR_DEPARTMENT_OTHER): Payer: Self-pay

## 2017-03-03 DIAGNOSIS — R059 Cough, unspecified: Secondary | ICD-10-CM

## 2017-03-03 DIAGNOSIS — R0602 Shortness of breath: Secondary | ICD-10-CM | POA: Insufficient documentation

## 2017-03-03 DIAGNOSIS — R079 Chest pain, unspecified: Secondary | ICD-10-CM | POA: Diagnosis present

## 2017-03-03 DIAGNOSIS — R05 Cough: Secondary | ICD-10-CM

## 2017-03-03 DIAGNOSIS — Z87891 Personal history of nicotine dependence: Secondary | ICD-10-CM | POA: Insufficient documentation

## 2017-03-03 DIAGNOSIS — R0789 Other chest pain: Secondary | ICD-10-CM | POA: Insufficient documentation

## 2017-03-03 DIAGNOSIS — Z79899 Other long term (current) drug therapy: Secondary | ICD-10-CM | POA: Insufficient documentation

## 2017-03-03 LAB — COMPREHENSIVE METABOLIC PANEL
ALBUMIN: 3.3 g/dL — AB (ref 3.5–5.0)
ALK PHOS: 59 U/L (ref 38–126)
ALT: 12 U/L — AB (ref 14–54)
ANION GAP: 3 — AB (ref 5–15)
AST: 22 U/L (ref 15–41)
BUN: 15 mg/dL (ref 6–20)
CALCIUM: 8.7 mg/dL — AB (ref 8.9–10.3)
CHLORIDE: 108 mmol/L (ref 101–111)
CO2: 24 mmol/L (ref 22–32)
CREATININE: 0.82 mg/dL (ref 0.44–1.00)
GFR calc Af Amer: >60 mL/min (ref >60)
GFR calc non Af Amer: >60 mL/min (ref >60)
GLUCOSE: 82 mg/dL (ref 65–99)
Potassium: 3.7 mmol/L (ref 3.5–5.1)
SODIUM: 135 mmol/L (ref 135–145)
Total Bilirubin: 0.7 mg/dL (ref 0.3–1.2)
Total Protein: 8.2 g/dL — ABNORMAL HIGH (ref 6.5–8.1)

## 2017-03-03 LAB — CBC
HCT: 30.3 % — ABNORMAL LOW (ref 36.0–46.0)
HEMOGLOBIN: 9.4 g/dL — AB (ref 12.0–15.0)
MCH: 27.9 pg (ref 26.0–34.0)
MCHC: 31 g/dL (ref 30.0–36.0)
MCV: 89.9 fL (ref 78.0–100.0)
PLATELETS: 197 10*3/uL (ref 150–400)
RBC: 3.37 MIL/uL — AB (ref 3.87–5.11)
RDW: 14.1 % (ref 11.5–15.5)
WBC: 3.4 10*3/uL — ABNORMAL LOW (ref 4.0–10.5)

## 2017-03-03 LAB — TROPONIN I: Troponin I: <0.03 ng/mL (ref <0.03)

## 2017-03-03 LAB — D-DIMER, QUANTITATIVE: D-Dimer, Quant: 0.6 ug/mL-FEU — ABNORMAL HIGH (ref 0.00–0.50)

## 2017-03-03 LAB — BRAIN NATRIURETIC PEPTIDE: B Natriuretic Peptide: 22.2 pg/mL (ref 0.0–100.0)

## 2017-03-03 MED ORDER — IOPAMIDOL (ISOVUE-370) INJECTION 76%
100.0000 mL | Freq: Once | INTRAVENOUS | Status: AC | PRN
  Administered 2017-03-03: 100 mL via INTRAVENOUS

## 2017-03-03 NOTE — ED Provider Notes (Addendum)
[facility] EMERGENCY DEPARTMENT Provider Note  CSN: [id number] Arrival date & time: 03/03/17 1213  Chief Complaint(s) Chest Pain  HPI Madeline Harper is a 66 y.o. female with h/o mix connective tissue disease.  The history is provided by the patient.  Chest Pain   This is a new problem. Episode onset: 1 week. Episode frequency: intermittent; daily mostly at night. The problem has not changed since onset.The pain is associated with movement. Pain location: sternal. The pain is moderate. The symptoms are aggravated by certain positions and exertion. Associated symptoms include cough (chronic cough; has worsening over several weeks), malaise/fatigue, shortness of breath (with DOE) and sputum production (yellow). Pertinent negatives include no abdominal pain, no back pain, no diaphoresis, no fever, no hemoptysis, no leg pain, no lower extremity edema, no nausea, no orthopnea, no palpitations and no vomiting. She has tried rest for the symptoms. The treatment provided moderate relief. Risk factors include being elderly.  Her past medical history is significant for connective tissue disease.  Pertinent negatives for past medical history include no CAD, no cancer, no diabetes, no DVT, no hyperlipidemia, no hypertension, no MI, no PE and no strokes.  Pertinent negatives for family medical history include: no early MI.    Past Medical History Past Medical History:  Diagnosis Date  . Autoimmune disease (HCC)   . Bone spur of foot   . Cataract   . DDD (degenerative disc disease), cervical 01/26/2017   Patient Active Problem List   Diagnosis Date Noted  . DDD (degenerative disc disease), cervical 01/26/2017  . Mixed connective tissue disease (HCC) 07/12/2016  . 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 High risk medication use 05/28/2016  . Raynaud's disease without gangrene 05/28/2016  . Bilateral foot pain 03/27/2016  . Trigger index finger of right  hand 03/27/2016  . Elevated sedimentation rate 02/28/2016  . Positive ANA (antinuclear antibody) 02/27/2016  . Bilateral shoulder pain 02/27/2016  . Status post left foot surgery 06/28/2015  . Bone spur of foot 06/14/2015   Home Medication(s) Prior to Admission medications   Medication Sig Start Date End Date Taking? Authorizing Provider  acetaminophen (TYLENOL 8 HOUR ARTHRITIS PAIN) 650 MG CR tablet Take 650 mg by mouth every 8 (eight) hours as needed for pain.    [provider]  azaTHIOprine (IMURAN) 50 MG tablet Take 2 tablets (100 mg total) by mouth daily. 10/24/16   Pollyann Savoy, MD  calcium carbonate (CALCIUM 600) 600 MG TABS tablet Take 600 mg by mouth daily.    [provider]  estradiol (ESTRACE VAGINAL) 0.1 MG/GM vaginal cream as needed.  01/05/16   [provider]  guaiFENesin (MUCINEX) 600 MG 12 hr tablet Take 1 tablet (600 mg total) by mouth 2 (two) times daily. 12/24/16   Kalman Shan, MD  hydroxychloroquine (PLAQUENIL) 200 MG tablet Take 300 mg by mouth. 12/11/16   [provider]  Multiple Vitamins-Minerals (MULTI ADULT GUMMIES PO) Take 1 tablet by mouth daily.    [provider]  Past Surgical History Past Surgical History:  Procedure Laterality Date  . CATARACT EXTRACTION, BILATERAL    . FOOT SURGERY    . KIDNEY STONE SURGERY  1972  . TUBAL LIGATION  1970   Family History Family History  Problem Relation Age of Onset  . Diabetes Mother   . Heart disease Mother   . Kidney failure Mother   . Cancer Mother        Uterine Cancer  . Diabetes Maternal Grandmother   . Kidney failure Maternal Grandmother   . Stroke Sister   . Gout Maternal Grandfather     Social History Social History   Tobacco Use  . Smoking status: Former Smoker    Packs/day: 2.00    Years: 15.00    Pack years:  30.00    Types: Cigarettes    Last attempt to quit: 04/08/1980    Years since quitting: 36.9  . Smokeless tobacco: Never Used  Substance Use Topics  . Alcohol use: Yes    Alcohol/week: 0.0 oz    Comment: Occ  . Drug use: No   Allergies Lyrica [pregabalin]  Review of Systems Review of Systems  Constitutional: Positive for malaise/fatigue. Negative for diaphoresis and fever.  Respiratory: Positive for cough (chronic cough; has worsening over several weeks), sputum production (yellow) and shortness of breath (with DOE). Negative for hemoptysis.   Cardiovascular: Positive for chest pain. Negative for palpitations and orthopnea.  Gastrointestinal: Negative for abdominal pain, nausea and vomiting.  Musculoskeletal: Negative for back pain.   All other systems are reviewed and are negative for acute change except as noted in the HPI  Physical Exam Vital Signs  I have reviewed the triage vital signs BP 137/69 (BP Location: Left Arm)   Pulse (!) 102   Temp 99.7 F (37.6 C) (Oral)   Resp 20   Ht 4\' 11"  (1.499 m)   Wt 61.2 kg (135 lb)   SpO2 99%   BMI 27.27 kg/m   Physical Exam  Constitutional: She is oriented to person, place, and time. She appears well-developed and well-nourished. No distress.  HENT:  Head: Normocephalic and atraumatic.  Nose: Nose normal.  Eyes: Conjunctivae and EOM are normal. Pupils are equal, round, and reactive to light. Right eye exhibits no discharge. Left eye exhibits no discharge. No scleral icterus.  Neck: Normal range of motion. Neck supple.  Cardiovascular: Normal rate and regular rhythm. Exam reveals no gallop and no friction rub.  No murmur heard. Pulmonary/Chest: Effort normal. No stridor. No respiratory distress. She has no wheezes. She has no rhonchi. She has rales (fine) in the right lower field and the left lower field.  Abdominal: Soft. She exhibits no distension. There is no tenderness.  Musculoskeletal: She exhibits no edema or tenderness.    No edema  Neurological: She is alert and oriented to person, place, and time.  Skin: Skin is warm and dry. No rash noted. She is not diaphoretic. No erythema.  Psychiatric: She has a normal mood and affect.  Vitals reviewed.   ED Results and Treatments Labs (all labs ordered are listed, but only abnormal results are displayed) Labs Reviewed  CBC - Abnormal; Notable for the following components:      Result Value   WBC 3.4 (*)    RBC 3.37 (*)    Hemoglobin 9.4 (*)    HCT 30.3 (*)    All other components within normal limits  COMPREHENSIVE METABOLIC PANEL - Abnormal; Notable for the following components:  Calcium 8.7 (*)    Total Protein 8.2 (*)    Albumin 3.3 (*)    ALT 12 (*)    Anion gap 3 (*)    All other components within normal limits  D-DIMER, QUANTITATIVE (NOT AT [facility]) - Abnormal; Notable for the following components:   D-Dimer, Quant 0.60 (*)    All other components within normal limits  BRAIN NATRIURETIC PEPTIDE  TROPONIN I  TROPONIN I                                                                                                                         EKG  EKG Interpretation  Date/Time:  Monday March 03 2017 12:34:59 EST Ventricular Rate:  93 PR Interval:    QRS Duration: 71 QT Interval:  373 QTC Calculation: 464 R Axis:   6 Text Interpretation:  Sinus rhythm Probable left atrial enlargement Left ventricular hypertrophy Nonspecific T wave abnormality NO STEMI No old tracing to compare Confirmed by Drema Pryardama, Alon Mazor [phone]([id number]) on 03/03/2017 12:38:03 PM      Radiology Dg Chest 2 View  Result Date: 03/03/2017 CLINICAL DATA:  Pt having chest  Tightness for a week,x-smoker,cough EXAM: CHEST  2 VIEW COMPARISON:  03/27/2016 FINDINGS: Heart size is normal. There are stable coarse opacities at the lung bases bilaterally, unchanged in appearance. No focal consolidations or pulmonary edema. IMPRESSION: Stable appearance of the chest. Electronically Signed   By:  Norva PavlovElizabeth  Brown M.D.   On: 03/03/2017 12:51   Ct Angio Chest Pe W And/or Wo Contrast  Result Date: 03/03/2017 CLINICAL DATA:  Chronic productive cough, chest soreness for 1 week EXAM: CT ANGIOGRAPHY CHEST WITH CONTRAST TECHNIQUE: Multidetector CT imaging of the chest was performed using the standard protocol during bolus administration of intravenous contrast. Multiplanar CT image reconstructions and MIPs were obtained to evaluate the vascular anatomy. CONTRAST:  100mL ISOVUE-370 IOPAMIDOL (ISOVUE-370) INJECTION 76% COMPARISON:  05/28/2016 FINDINGS: Cardiovascular: Satisfactory opacification of the pulmonary arteries to the segmental level. No evidence of pulmonary embolism. Normal heart size. No pericardial effusion. Normal caliber thoracic aorta. Minimal thoracic aortic atherosclerosis. Mediastinum/Nodes: Enlarged subcarinal lymph node measuring 13 mm unchanged the prior exam. Scattered small subcentimeter mediastinal lymph nodes. Thyroid gland, trachea, and esophagus demonstrate no significant findings. Lungs/Pleura: Bilateral interlobular and interlobular septal thickening with mild bronchiectasis seen bilaterally, worse at the lung bases. No pleural effusion or pneumothorax. No pulmonary mass. Upper Abdomen: No acute abnormality. Musculoskeletal: No aggressive lytic or sclerotic osseous lesion. No acute osseous abnormality. Review of the MIP images confirms the above findings. IMPRESSION: 1. No evidence pulmonary embolus. 2. Chronic interstitial lung disease. 3.  Aortic Atherosclerosis (ICD10-170.0) Electronically Signed   By: Elige KoHetal  Patel   On: 03/03/2017 15:12   Pertinent labs & imaging results that were available during my care of the patient were reviewed by me and considered in my medical decision making (see chart for details).  Medications Ordered in ED Medications  iopamidol (ISOVUE-370) 76 % injection 100 mL (  100 mLs Intravenous Contrast Given 03/03/17 1454)                                                                                                                                     Procedures Procedures   EMERGENCY DEPARTMENT US CARDIAC EXAM "Study: Limited Ultrasound of the Heart and Pericardium"  INDICATIONS:Chest pain Multiple views of the heart and pericardium were obtained in real-time with a multi-frequency probe.  PERFORMED AV:[id number]:Myself IMAGES ARCHIVED?: Yes LIMITATIONS:  Body habitus VIEWS USED: Parasternal long axis, Parasternal short axis and Apical 4 chamber  INTERPRETATION: Cardiac activity present, Pericardial effusioin absent and Normal contractility   (including critical care time)  Medical Decision Making / ED Course I have reviewed the nursing notes for this encounter and the patient's prior records (if available in EHR or on provided paperwork).    Atypical chest pain inconsistent with ACS.  EKG with nonspecific T wave inversions.  No evidence of pericarditis.  No prior EKG for comparison.  Initial troponin negative.  Heart score less than 4.  She is appropriate for delta troponin rest of the workup is unremarkable.  Bedside ultrasound negative for pericardial effusion.  Low pretest probability for pulmonary embolism but unable to [facility].  D-dimer mildly elevated.  Given her history of autoimmune disorder, CTA was obtained to rule out pulmonary embolism which was negative for PEs, evidence of pneumonia, pulmonary edema, pleural effusions.  It did note patient's chronic interstitial lung disease.  No other acute findings.  Exam not consistent with evidence of volume overload.  BNP within normal limits.  Presentation not classic for aortic dissection or esophageal perforation.  Delta Trop negative.  The patient appears reasonably screened and/or stabilized for discharge and I doubt any other medical condition or other [facility] requiring further screening, evaluation, or treatment in the ED at this time prior to discharge.  The patient is safe for  discharge with strict return precautions.   Final diagnoses:  Atypical chest pain  SOB (shortness of breath)  Cough   Disposition: Discharge  Condition: Good  I have discussed the results, Dx and Tx plan with the patient who expressed understanding and agree(s) with the plan. Discharge instructions discussed at great length. The patient was given strict return precautions who verbalized understanding of the instructions. No further questions at time of discharge.    ED Discharge Orders    None       Follow Up: Copland, Gwenlyn FoundJessica C, MD [address] [place] [id number] [phone]  Schedule an appointment as soon as possible for a visit  in 3-5 days, If symptoms do not improve or  worsen     This chart was dictated using voice recognition software.  Despite best efforts to proofread,  errors can occur which can change the documentation meaning.       Nira Connardama, Lawson Mahone Eduardo, MD 03/03/17 [phone]

## 2017-03-03 NOTE — ED Notes (Signed)
ED Provider at bedside. 

## 2017-03-03 NOTE — ED Triage Notes (Signed)
Pt states she has an autoimmune disease that affects her lungs-chronic prod cough-increase in chest "soreness" and more painful with movement-NAD-steady gait

## 2017-03-04 NOTE — Progress Notes (Addendum)
[facility] [address], [address] [place], [place] [address] [phone] [phone]  Date:  03/05/2017   Name:  Madeline KinsmanDiane Harper   DOB:  [birth year]   MRN:  [id number]  PCP:  Pearline Cablesopland, Jessica C, MD    Chief Complaint: Hospitalization Follow-up (Pt here for hosp f/u on chest pain. Pt denies having anymore chest pain since hospital. )   History of Present Illness:  Madeline KinsmanDiane Harper is a 66 y.o. very pleasant female patient who presents with the following:  Here today to discuss chest pain/ ER visit History of mixed connective tissue disorder, cervical DDD, interstitial pneumonitis  She is a former smoker Rheumatology: [facility]  She was in the ER on 11/26 with concern of chest discomfort. She was evaluated and released to home: Atypical chest pain inconsistent with ACS.  EKG with nonspecific T wave inversions.  No evidence of pericarditis.  No prior EKG for comparison.  Initial troponin negative.  Heart score less than 4.  She is appropriate for delta troponin rest of the workup is unremarkable. Bedside ultrasound negative for pericardial effusion. Low pretest probability for pulmonary embolism but [facility].  D-dimer mildly elevated.  Given her history of autoimmune disorder, CTA was obtained to rule out pulmonary embolism which was negative for PEs, evidence of pneumonia, pulmonary edema, pleural effusions.  It did note patient's chronic interstitial lung disease.  No other acute findings. Exam not consistent with evidence of volume overload.  BNP within normal limits. Presentation not classic for aortic dissection or esophageal perforation. Delta Trop negative. The patient appears reasonably screened and/or stabilized for discharge and I doubt any other medical condition or other [facility] requiring further screening, evaluation, or treatment in the ED at this time prior to discharge.  Pt notes that her CP started about 10 days prior to her ER  evaluation - it was coming and going,but started to get worse. Her friends and family got concerned about her and she went to the ER She is seeing Dr. Marchelle Gearingamaswamy for her pulmonary disease, next visit with him in January  She feels a bit more fatigued and more SOB than is normal for her- this has been present for longer than 10 days however, perhaps there for 2-3 months I will touch base with him about any inhaler that may be helpful for her  Her joints have been better with her current meds - imuran and plaquenil Her lung disease and the rumatologisc disease are thought to be connected   Her Hg was significantly lower than it was in August at recent ER visit, and her calcium was slightly low; will recheck these today  Her chest symptoms seem to have resolved since she was in the ER - no further CP noted   Patient Active Problem List   Diagnosis Date Noted  . DDD (degenerative disc disease), cervical 01/26/2017  . Mixed connective tissue disease (HCC) 07/12/2016  . UIP (usual interstitial pneumonitis) (HCC) - [facility] 06/26[facility]  . High risk medication use 05/28/2016  . Raynaud's disease without gangrene 05/28/2016  . Bilateral foot pain 03/27/2016  . Trigger index finger of right hand 03/27/2016  . Elevated sedimentation rate 02/28/2016  . Positive ANA (antinuclear antibody) 02/27/2016  . Bilateral shoulder pain 02/27/2016  . Status post left foot surgery 06/28/2015  . Bone spur of foot 06/14/2015    Past Medical History:  Diagnosis Date  . Autoimmune disease (HCC)   .  Bone spur of foot   . Cataract   . DDD (degenerative disc disease), cervical 01/26/2017    Past Surgical History:  Procedure Laterality Date  . CATARACT EXTRACTION, BILATERAL    . FOOT SURGERY    . KIDNEY STONE SURGERY  1972  . TUBAL LIGATION  1970    Social History   Tobacco Use  . Smoking status: Former Smoker    Packs/day: 2.00    Years: 15.00    Pack years: 30.00    Types: Cigarettes     Last attempt to quit: 04/08/1980    Years since quitting: 36.9  . Smokeless tobacco: Never Used  Substance Use Topics  . Alcohol use: Yes    Alcohol/week: 0.0 oz    Comment: Occ  . Drug use: No    Family History  Problem Relation Age of Onset  . Diabetes Mother   . Heart disease Mother   . Kidney failure Mother   . Cancer Mother        Uterine Cancer  . Diabetes Maternal Grandmother   . Kidney failure Maternal Grandmother   . Stroke Sister   . Gout Maternal Grandfather     Allergies  Allergen Reactions  . Lyrica [Pregabalin] Swelling    Pedal edema     Medication list has been reviewed and updated.  Current Outpatient Medications on File Prior to Visit  Medication Sig Dispense Refill  . acetaminophen (TYLENOL 8 HOUR ARTHRITIS PAIN) 650 MG CR tablet Take 650 mg by mouth every 8 (eight) hours as needed for pain.    Marland Kitchen azaTHIOprine (IMURAN) 50 MG tablet Take 2 tablets (100 mg total) by mouth daily. 60 tablet 2  . calcium carbonate (CALCIUM 600) 600 MG TABS tablet Take 600 mg by mouth daily.    Marland Kitchen estradiol (ESTRACE VAGINAL) 0.1 MG/GM vaginal cream as needed.     Marland Kitchen guaiFENesin (MUCINEX) 600 MG 12 hr tablet Take 1 tablet (600 mg total) by mouth 2 (two) times daily. 60 tablet 0  . hydroxychloroquine (PLAQUENIL) 200 MG tablet Take 300 mg by mouth.    . Multiple Vitamins-Minerals (MULTI ADULT GUMMIES PO) Take 1 tablet by mouth daily.     No current facility-administered medications on file prior to visit.     Review of Systems:  As per HPI- otherwise negative.  No fever or chills No nausea or vomiting  Physical Examination: Vitals:   03/05/17 1142  BP: 130/78  Pulse: 100  Temp: 98.8 F (37.1 C)  SpO2: 94%   Vitals:   03/05/17 1142  Height: 4\' 11"  (1.499 m)   Body mass index is 27.27 kg/m. Ideal Body Weight: Weight in (lb) to have BMI = 25: 123.5  GEN: WDWN, NAD, Non-toxic, A & O x 3, mild overweight, looks well HEENT: Atraumatic, Normocephalic. Neck supple. No  masses, No LAD.  Bilateral TM wnl, oropharynx normal.  PEERL,EOMI.   Ears and Nose: No external deformity. CV: RRR, No M/G/R. No JVD. No thrill. No extra heart sounds. PULM: CTA B, no wheezes, crackles, rhonchi. No retractions. No resp. distress. No accessory muscle use. EXTR: No c/c/e NEURO Normal gait.  PSYCH: Normally interactive. Conversant. Not depressed or anxious appearing.  Calm demeanor.    Assessment and Plan: Mixed connective tissue disease (HCC) - Plan: Basic metabolic panel, CBC  UIP (usual interstitial pneumonitis) [facility])  Hospital discharge follow-up  Follow-up from ER today Her CP has resolved Message to pulmonology about her UIP symptoms Await follow-up labs today  Signed  Abbe AmsterdamJessica Copland, MD  Received her labs 11/29- noted leukopenia. She is on Imuran and plaquenil per rheumatology Spoke with her rheumatologist Dr. Sheppard PentonWolf with [facility];  Her leukopenia is not far from her baseline, ok to continue current meds She is also anemic- will make sure she has colon cancer screening    Letter to pt Your blood count shows low white cell count and anemia. Your low white blood cell count may be due to your connective tissue disorder, or to the treatment for this condition. I spoke with your rheumatologist who did not feel there was cause for alarm at this time. You can continue your medications.   You are a bit anemic still- better than when you were in the ER but your red cell counts are still low. We always need to think about colon cancer in this situation- have you had recent screening with a colonoscopy or other test?  If not, please let me know and I will arrange this for you!  Results for orders placed or performed in visit on 03/05/17  Basic metabolic panel  Result Value Ref Range   Sodium 138 135 - 145 mEq/L   Potassium 3.8 3.5 - 5.1 mEq/L   Chloride 108 96 - 112 mEq/L   CO2 26 19 - 32 mEq/L   Glucose, Bld 88 70 - 99 mg/dL   BUN 18 6 - 23 mg/dL   Creatinine,  Ser 1.610.89 0.40 - 1.20 mg/dL   Calcium 9.4 8.4 - 09.610.5 mg/dL   GFR 04.5481.73 >09.81>60.00 mL/min  CBC  Result Value Ref Range   WBC 2.0 Repeated and verified X2. (L) 4.0 - 10.5 K/uL   RBC 3.62 (L) 3.87 - 5.11 Mil/uL   Platelets 212.0 150.0 - 400.0 K/uL   Hemoglobin 10.3 (L) 12.0 - 15.0 g/dL   HCT 19.132.5 (L) 47.836.0 - 29.546.0 %   MCV 89.8 78.0 - 100.0 fl   MCHC 31.7 30.0 - 36.0 g/dL   RDW 62.114.7 30.811.5 - 65.715.5 %

## 2017-03-05 ENCOUNTER — Ambulatory Visit (INDEPENDENT_AMBULATORY_CARE_PROVIDER_SITE_OTHER): Payer: Medicare Other | Admitting: Family Medicine

## 2017-03-05 ENCOUNTER — Encounter: Payer: Self-pay | Admitting: Family Medicine

## 2017-03-05 VITALS — BP 130/78 | HR 100 | Temp 98.8°F | Ht 59.0 in

## 2017-03-05 DIAGNOSIS — J84112 Idiopathic pulmonary fibrosis: Secondary | ICD-10-CM | POA: Diagnosis not present

## 2017-03-05 DIAGNOSIS — Z09 Encounter for follow-up examination after completed treatment for conditions other than malignant neoplasm: Secondary | ICD-10-CM

## 2017-03-05 DIAGNOSIS — M351 Other overlap syndromes: Secondary | ICD-10-CM

## 2017-03-05 LAB — BASIC METABOLIC PANEL
BUN: 18 mg/dL (ref 6–23)
CALCIUM: 9.4 mg/dL (ref 8.4–10.5)
CHLORIDE: 108 meq/L (ref 96–112)
CO2: 26 meq/L (ref 19–32)
CREATININE: 0.89 mg/dL (ref 0.40–1.20)
GFR: 81.73 mL/min (ref >60.00)
GLUCOSE: 88 mg/dL (ref 70–99)
Potassium: 3.8 mEq/L (ref 3.5–5.1)
SODIUM: 138 meq/L (ref 135–145)

## 2017-03-05 LAB — CBC
HEMATOCRIT: 32.5 % — AB (ref 36.0–46.0)
Hemoglobin: 10.3 g/dL — ABNORMAL LOW (ref 12.0–15.0)
MCHC: 31.7 g/dL (ref 30.0–36.0)
MCV: 89.8 fl (ref 78.0–100.0)
PLATELETS: 212 10*3/uL (ref 150.0–400.0)
RBC: 3.62 Mil/uL — AB (ref 3.87–5.11)
RDW: 14.7 % (ref 11.5–15.5)
WBC: 2 10*3/uL — ABNORMAL LOW (ref 4.0–10.5)

## 2017-03-05 NOTE — Patient Instructions (Signed)
I am glad that you are feeling better!   A dose of mucinex before bed maybe helpful for you We will repeat labs for you today and I will be in touch with your results I will also contact Dr. Elvera Lennox about any inhaler that might be helpful for you  Take care!

## 2017-03-06 ENCOUNTER — Encounter: Payer: Self-pay | Admitting: Family Medicine

## 2017-03-20 ENCOUNTER — Other Ambulatory Visit: Payer: Self-pay | Admitting: Family Medicine

## 2017-04-14 DIAGNOSIS — M351 Other overlap syndromes: Secondary | ICD-10-CM | POA: Diagnosis not present

## 2017-04-14 DIAGNOSIS — R5383 Other fatigue: Secondary | ICD-10-CM | POA: Diagnosis not present

## 2017-04-14 DIAGNOSIS — R0600 Dyspnea, unspecified: Secondary | ICD-10-CM | POA: Diagnosis not present

## 2017-04-14 DIAGNOSIS — Z79899 Other long term (current) drug therapy: Secondary | ICD-10-CM | POA: Diagnosis not present

## 2017-04-14 DIAGNOSIS — E559 Vitamin D deficiency, unspecified: Secondary | ICD-10-CM | POA: Diagnosis not present

## 2017-04-14 DIAGNOSIS — R0602 Shortness of breath: Secondary | ICD-10-CM | POA: Diagnosis not present

## 2017-05-06 NOTE — Progress Notes (Deleted)
[facility] [address], [address] [place], [place] [address] [phone] [phone]  Date:  05/07/2017   Name:  Madeline KinsmanDiane Harper   DOB:  01-08-1951   MRN:  [id number]  PCP:  Pearline Cablesopland, Amanda Pote C, MD    Chief Complaint: No chief complaint on file.   History of Present Illness:  Madeline KinsmanDiane Harper is a 67 y.o. very pleasant female patient who presents with the following:  I last saw her in November after she was in the ER with CP  History of mixed connective tissue disorder, cervical DDD, interstitial pneumonitis  She is a former smoker Rheumatology: [facility]  Patient Active Problem List   Diagnosis Date Noted  . DDD (degenerative disc disease), cervical 01/26/2017  . Mixed connective tissue disease (HCC) 07/12/2016  . UIP (usual interstitial pneumonitis) (HCC) - [facility] 06/26/2016  . High risk medication use 05/28/2016  . Raynaud's disease without gangrene 05/28/2016  . Bilateral foot pain 03/27/2016  . Trigger index finger of right hand 03/27/2016  . Elevated sedimentation rate 02/28/2016  . Positive ANA (antinuclear antibody) 02/27/2016  . Bilateral shoulder pain 02/27/2016  . Status post left foot surgery 06/28/2015  . Bone spur of foot 06/14/2015    Past Medical History:  Diagnosis Date  . Autoimmune disease (HCC)   . Bone spur of foot   . Cataract   . DDD (degenerative disc disease), cervical 01/26/2017    Past Surgical History:  Procedure Laterality Date  . CATARACT EXTRACTION, BILATERAL    . FOOT SURGERY    . KIDNEY STONE SURGERY  1972  . TUBAL LIGATION  1970    Social History   Tobacco Use  . Smoking status: Former Smoker    Packs/day: 2.00    Years: 15.00    Pack years: 30.00    Types: Cigarettes    Last attempt to quit: 04/08/1980    Years since quitting: 37.1  . Smokeless tobacco: Never Used  Substance Use Topics  . Alcohol use: Yes    Alcohol/week: 0.0 oz    Comment: Occ  . Drug use: No     Family History  Problem Relation Age of Onset  . Diabetes Mother   . Heart disease Mother   . Kidney failure Mother   . Cancer Mother        Uterine Cancer  . Diabetes Maternal Grandmother   . Kidney failure Maternal Grandmother   . Stroke Sister   . Gout Maternal Grandfather     Allergies  Allergen Reactions  . Lyrica [Pregabalin] Swelling    Pedal edema     Medication list has been reviewed and updated.  Current Outpatient Medications on File Prior to Visit  Medication Sig Dispense Refill  . acetaminophen (TYLENOL 8 HOUR ARTHRITIS PAIN) 650 MG CR tablet Take 650 mg by mouth every 8 (eight) hours as needed for pain.    Marland Kitchen. azaTHIOprine (IMURAN) 50 MG tablet Take 2 tablets (100 mg total) by mouth daily. 60 tablet 2  . calcium carbonate (CALCIUM 600) 600 MG TABS tablet Take 600 mg by mouth daily.    Marland Kitchen. estradiol (ESTRACE VAGINAL) 0.1 MG/GM vaginal cream as needed.     Marland Kitchen. guaiFENesin (MUCINEX) 600 MG 12 hr tablet Take 1 tablet (600 mg total) by mouth 2 (two) times daily. 60 tablet 0  . hydroxychloroquine (PLAQUENIL) 200 MG tablet Take 300 mg by mouth.    . Multiple Vitamins-Minerals (MULTI ADULT GUMMIES  PO) Take 1 tablet by mouth daily.     No current facility-administered medications on file prior to visit.     Review of Systems:  As per HPI- otherwise negative.   Physical Examination: There were no vitals filed for this visit. There were no vitals filed for this visit. There is no height or weight on file to calculate BMI. Ideal Body Weight:    GEN: WDWN, NAD, Non-toxic, A & O x 3 HEENT: Atraumatic, Normocephalic. Neck supple. No masses, No LAD. Ears and Nose: No external deformity. CV: RRR, No M/G/R. No JVD. No thrill. No extra heart sounds. PULM: CTA B, no wheezes, crackles, rhonchi. No retractions. No resp. distress. No accessory muscle use. ABD: S, NT, ND, +BS. No rebound. No HSM. EXTR: No c/c/e NEURO Normal gait.  PSYCH: Normally interactive. Conversant.  Not depressed or anxious appearing.  Calm demeanor.    Assessment and Plan: ***  Signed Abbe Amsterdam, MD

## 2017-05-07 ENCOUNTER — Ambulatory Visit: Payer: Medicare Other | Admitting: Family Medicine

## 2017-05-07 DIAGNOSIS — Z0289 Encounter for other administrative examinations: Secondary | ICD-10-CM

## 2017-05-16 ENCOUNTER — Ambulatory Visit (INDEPENDENT_AMBULATORY_CARE_PROVIDER_SITE_OTHER): Payer: Medicare Other | Admitting: Internal Medicine

## 2017-05-16 ENCOUNTER — Encounter: Payer: Self-pay | Admitting: Internal Medicine

## 2017-05-16 VITALS — BP 118/62 | HR 102 | Ht 59.0 in | Wt 128.8 lb

## 2017-05-16 DIAGNOSIS — M351 Other overlap syndromes: Secondary | ICD-10-CM | POA: Diagnosis not present

## 2017-05-16 DIAGNOSIS — R0689 Other abnormalities of breathing: Secondary | ICD-10-CM | POA: Diagnosis not present

## 2017-05-16 DIAGNOSIS — J84112 Idiopathic pulmonary fibrosis: Secondary | ICD-10-CM | POA: Diagnosis not present

## 2017-05-16 DIAGNOSIS — R0600 Dyspnea, unspecified: Secondary | ICD-10-CM

## 2017-05-16 NOTE — Patient Instructions (Addendum)
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 No lung volume or bd response. No post-bd spiro   Followup 2-6 weeks anyhtime but after completing above; regular clinic or ILD clinic

## 2017-05-16 NOTE — Progress Notes (Signed)
Subjective:     Patient ID: Madeline Harper, female   DOB: 06/07/1950, 67 y.o.   MRN: [id number]  HPI   HPI  Chief Complaint  Patient presents with  . Pulmonary Consult    Pt referred by Dr. Corliss Skains for abnormal lung sounds. Pt c/o DOE, prod cough with yellow mucus in morning - resolves throughout the day. Pt denies CP/tightness and f/c/s.      Notes from rheumatologist Dr. D reviewed and summarized Patient has a constellation of ANA greater than 1:1280, positive double-stranded DNA, positive RNP, positive Ro, low C3 and C4 associated with elevated sedimentation rate of 115, arthritis and raynaud. 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 During visit on 03/27/2016 based on my summarization of the chart crackles was observed in the flowsheet been referred here.  Madeline Harper presents for evaluation today. She tells me that for the last 1 year she's had arthralgia and Raynaud symptoms this then resulted in the mixed connective tissue diagnosis as stated above. She also tells me for the last 1 year after having a cold she's had chronic cough associated with sputum production. It is particularly worse in the daytime but she does cough during the rest of the day. She also tells me that she has mild dyspnea and exertion for climbing stairs. She tells me that she works out regularly at [employer] but she does not feel dyspneic although she demonstrated that she has labored breathing. This all improved with rest. 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 She is also wondering if the doctors of performing unnecessary tests on her.   Results for AARIYAH, SAMPEY (MRN [id number]) as of 05/22/2016 12:00  Ref. Range 02/28/2016 09:59  Creatinine Latest Ref Range: 0.50 - 0.99 mg/dL 7.82    She had a chest x-ray 03/27/2016: Shows coarse interstitial markings suggestive of interstitial lung  disease  Results for JAZYIAH, YIU (MRN [id number]) as of 05/22/2016 12:00  Ref. Range 02/28/2016 09:59  Cyclic Citrullin Peptide Ab Latest Units: Units <16  ds DNA Ab Latest Units: IU/mL 14 (H)  C3 Complement Latest Ref Range: 90 - 180 mg/dL 40 (L)  C4 Complement Latest Ref Range: 16 - 47 mg/dL 8 (L)  IgG (Immunoglobin G), Serum Latest Ref Range: 694 - 1,618 mg/dL 5,784 (H)  IgA Latest Ref Range: 81 - 463 mg/dL 696  EXBMW-4-XLKGMWNU Latest Ref Range: 0.2 - 0.3 g/dL 0.3  UVOZD-6-UYQIHKVQ Latest Ref Range: 0.5 - 0.9 g/dL 0.8  Ribonucleic Protein(ENA) Antibody, IgG Latest Ref Range: <1.0 NEG AI  >8.0 POS (H)  SSA (Ro) (ENA) Antibody, IgG Latest Ref Range: <1.0 NEG AI  5.7 POS (H)  SSB (La) (ENA) Antibody, IgG Latest Ref Range: <1.0 NEG AI  <1.0 NEG  Scleroderma (Scl-70) (ENA) Antibody, IgG Latest Ref Range: <1.0 NEG AI  <1.0 NEG      has a past medical history of Bone spur of foot.   reports that she quit smoking about 36 years ago. Her smoking use included Cigarettes. She has a 30.00 pack-year smoking history. She has never used smokeless tobacco.   OV 06/26/2016  Chief Complaint  Patient presents with  . Follow-up    Pt here after CT chest, PFT, and echo. Pt deneis change in breahting since last OV.    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 In the interim  she did see Dr. Algis Downs on 05/28/2016 and has been recommended Imuran based on her symptoms, autoimmune profile and presence of UIP on CT chest. The show that she has grade 1 diastolic dysfunction associated borderline elevation of pulmonary artery systolic pressure which could easily be due to grade 1 diastolic dysfunction. 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 Based on pulmonary function testing and is only mild severe   The overwhelming thing for this visit is that she is extremely scared to take Imuran  another immune suppressants. She is worried about the cancer risk. We talked about the spiritual distress that she is in. We talked about the pros and cons of these medications. We talked about the risk of disease progression particularly pulmonary fibrosis. We talked about the future of drugs that are less toxic being developed such as Pirfenidone (Esbriet) and Ofev . We talked about  having to manage her chronic disease not be in denial. After all this she was somewhat reassured we talked about the need to monitor  She does have early morning cough with some mild sputum for which she wants symptom relief   Echocardiogram 05/29/16: Shows ejection fraction of 65% with grade 1 diastoic dysfunction and a PSA systolic pressure off 28 mmHg  IMPRESSION: 1. 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 2. Aortic atherosclerosis.   Electronically Signed   By: Trudie Reed M.D.   On: 05/28/2016 14:41   OV 12/23/2016  Chief Complaint  Patient presents with  . Follow-up    Pt states that when she goes up the stairs, she becomes SOB and has to sit down. With just walking she states that she does not become SOB. C/o prod. cough in the A.M with yellow phlem.    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  Last visit was in March 2018. Because of the UIP pattern and despite mild interstitial lung disease and ongoing significant joint symptoms I supported the recommendation of the Imuran. Review of the chart from the rheumatologist shows that she was started on Imuran April 2018 at 50 mg per day. Then on 08/20/2016 it was increased to 75 mg per day. Currently she is on 100 mg per day of Imuran Her prednisone was tapered to 5 mg per day by 08/30/2016. In her most recent visit 10/24/2016 with Dr. Algis Downs it appears that she was still having  significant pain in her shoulders and spine from her mixed connective tissue disease. PAtient tells me 12/23/2016 after that visit she was then recommended to restart prednisone per Dr D (she was weaned off by then). So, she says she felt frustrated. She was then referred to [facility] sawe Dr Artis Flock 9/.5/18 and plaquenil added at this visit. Continue on  Immuran. Patient does not know if at this stage for joint symptoms are well-controlled with the addition of Plaquenil because it is only been 12 days or so. She is a little bit frustrated with the direction of her autoimmune therapy. In discussion with Dr. Algis Downs today over the telephone and it is felt that it is best that her autoimmune diseases is best served at [facility]. In talking to the patient she is not doing any physical therapy anymore or doing water aerobics. Her main joint issue appears to be stiffness especially early in the morning particularly the right shoulder  In terms  of her  symptoms she stable. The dyspnea is only mild. Her main issues that early morning she's having congestion with mild yellow sputum that is stable for many years. This no associated worsening of shortness of breath. This no change in color of sputum or wheezing. Her pulmonary function test shows that her ILD stable compared to last visit.  OV 05/16/2017  Chief Complaint  Patient presents with  . Follow-up    breathing when up the stairs SOB, cough mostly in the am occ yellow thick mucus in am. 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  Imuran and prednisone being monitored and prescribed by Dr. Sheppard Penton at [facility]  Overall patient is doing well.  But when she saw Dr. Sheppard Penton in April 14, 2017 at [facility] she complained of worsening shortness of breath.  She tells me the same.  This is noticed particularly when climbing stairs.  Otherwise her health is in good shape.  She is  exercising regularly.  So she does not think deconditioning is an issue.  King's interstitial lung disease questionnaire shows that his only dyspnea on exertion on the stairs that is worse but otherwise she is doing well.  She is compliant with her Imuran and CellCept.  Walking desaturation test today did not show any desaturations but she is been tachycardic at baseline.  She is noted to have some amount of diastolic dysfunction.  K-BILD ILD QUESTIONNAIRE, Symptom score over prior 2 weeks  7-none, 6-rarely, 5-occ, 5-some times, 3-sev times, 2-most times, 1-every time 05/16/2017   Dyspnea for stairs, incline or hill 2  Chest Tightness 6  Worry about seriousness of lung complaint 6  Avoided doing things that make you dyspneic 7  Have you felt loss of control of lung condition (reversed from original) 6  Felt fed up due to lung condition 7  Felt urge to breathe aka air hunger 6  Has lung condition made you feel anxious 7  How often have you experienced wheezing or whistling sound 7  How much of the time have you felt your lung dz is getting worse 7  How much has your lung condition interfered with job or daily task 7  Were you expecting your lung condition to get worse 7  How much has your lung function limited you carrying things like groceris 7  How much has your lung function made you think of EOL? 7  Total   Are you financially worse off 7  Grand Total       Walking desaturation test on 05/16/2017 185 feet x 3 laps on ROOM AIR:  did not desaturate. Rest pulse ox was 100%, final pulse ox was 100%. HR response 99/min at rest to 115/min at peak exertion. Patient Jonae Dan Humphreys  Did nto Desaturate < 88% . [place] did nto  Desaturated </= 3% points. Madeline Harper yes get tachyardic  Results for ETHELREDA, SUKHU (MRN [id number]) as of 05/16/2017 11:03  Ref. Range 06/25/2016 11:11 12/23/2016 09:51  FVC-Pre Latest Units: L 1.93 2.02  FVC-%Pred-Pre Latest Units: % 93 98  Results for VETA, DAMBROSIA  (MRN [id number]) as of 05/16/2017 11:03  Ref. Range 06/25/2016 11:11 12/23/2016 09:51  DLCO unc Latest Units: ml/min/mmHg 13.70 11.37  DLCO unc % pred Latest Units: % 73 61    has a past medical history of Autoimmune disease (HCC), Bone spur of foot, Cataract, and DDD (degenerative disc disease),  cervical (01/26/2017).   reports that she quit smoking about 37 years ago. Her smoking use included cigarettes. She has a 30.00 pack-year smoking history. she has never used smokeless tobacco.  Past Surgical History:  Procedure Laterality Date  . CATARACT EXTRACTION, BILATERAL    . FOOT SURGERY    . KIDNEY STONE SURGERY  1972  . TUBAL LIGATION  1970    Allergies  Allergen Reactions  . Lyrica [Pregabalin] Swelling    Pedal edema      There is no immunization history on file for this patient.  Family History  Problem Relation Age of Onset  . Diabetes Mother   . Heart disease Mother   . Kidney failure Mother   . Cancer Mother        Uterine Cancer  . Diabetes Maternal Grandmother   . Kidney failure Maternal Grandmother   . Stroke Sister   . Gout Maternal Grandfather      Current Outpatient Medications:  .  acetaminophen (TYLENOL 8 HOUR ARTHRITIS PAIN) 650 MG CR tablet, Take 650 mg by mouth every 8 (eight) hours as needed for pain., Disp: , Rfl:  .  azaTHIOprine (IMURAN) 50 MG tablet, Take 2 tablets (100 mg total) by mouth daily., Disp: 60 tablet, Rfl: 2 .  calcium carbonate (CALCIUM 600) 600 MG TABS tablet, Take 600 mg by mouth daily., Disp: , Rfl:  .  Ergocalciferol (VITAMIN D2 PO), Take 2,000 mg by mouth once a week., Disp: , Rfl:  .  estradiol (ESTRACE VAGINAL) 0.1 MG/GM vaginal cream, as needed. , Disp: , Rfl:  .  guaiFENesin (MUCINEX) 600 MG 12 hr tablet, Take 1 tablet (600 mg total) by mouth 2 (two) times daily., Disp: 60 tablet, Rfl: 0 .  hydroxychloroquine (PLAQUENIL) 200 MG tablet, Take 300 mg by mouth., Disp: , Rfl:  .  Multiple Vitamins-Minerals (MULTI ADULT GUMMIES PO),  Take 1 tablet by mouth daily., Disp: , Rfl:  .  Pyridoxine HCl (VITAMIN B-6) 500 MG tablet, Take 500 mg by mouth daily., Disp: , Rfl:  .  vitamin B-12 (CYANOCOBALAMIN) 1000 MCG tablet, Take 1,000 mcg by mouth daily., Disp: , Rfl:      Review of Systems     Objective:   Physical Exam Vitals:   05/16/17 1037 05/16/17 1039  BP:  118/62  Pulse:  (!) 102  SpO2:  98%  Weight: 128 lb 12.8 oz (58.4 kg)   Height: 4\' 11"  (1.499 m)     Estimated body mass index is 26.01 kg/m as calculated from the following:   Height as of this encounter: 4\' 11"  (1.499 m).   Weight as of this encounter: 128 lb 12.8 oz (58.4 kg).  Crackles at base  Discussion only visit    Assessment:       ICD-10-CM   1. Dyspnea and respiratory abnormality R06.00    R06.89   2. 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 MCTD (mixed connective tissue disease) (HCC) M35.1    Will reassess diastolic dysfn, presence of pulm htn and currnt progression with ILD. If ILD getting worse, agree with Dr Artis FlockWolfe about movnig to cellcept.     Plan:      - get echo - get Pre-bd spiro and dlco only. No lung volume or bd response. No post-bd spiro   Followup 2-6 weeks anyhtime but after completing above; regular clinic or ILD clinic   (> 50% of this 15 min visit spent in face  to face counseling or/and coordination of care)   Dr. Kalman Shan, M.D., [facility].C.P Pulmonary and Critical Care Medicine Staff Physician, [facility] Center Director - Interstitial Lung Disease  Program  Pulmonary Fibrosis [facility] at [facility] [place], [place], [address]  Pager: [phone], If no answer or between  15:00h - 7:00h: call [phone] Telephone: [phone]

## 2017-05-17 IMAGING — DX DG CHEST 2V
2 series · 2 of 2 positions shown · non-contrast
Comparison: PA and lateral chest 05/23/2015.

CLINICAL DATA: Abnormal lung sounds in patient with mixed
connective tissue disease.

EXAM:
CHEST  2 VIEW

[chest pa]
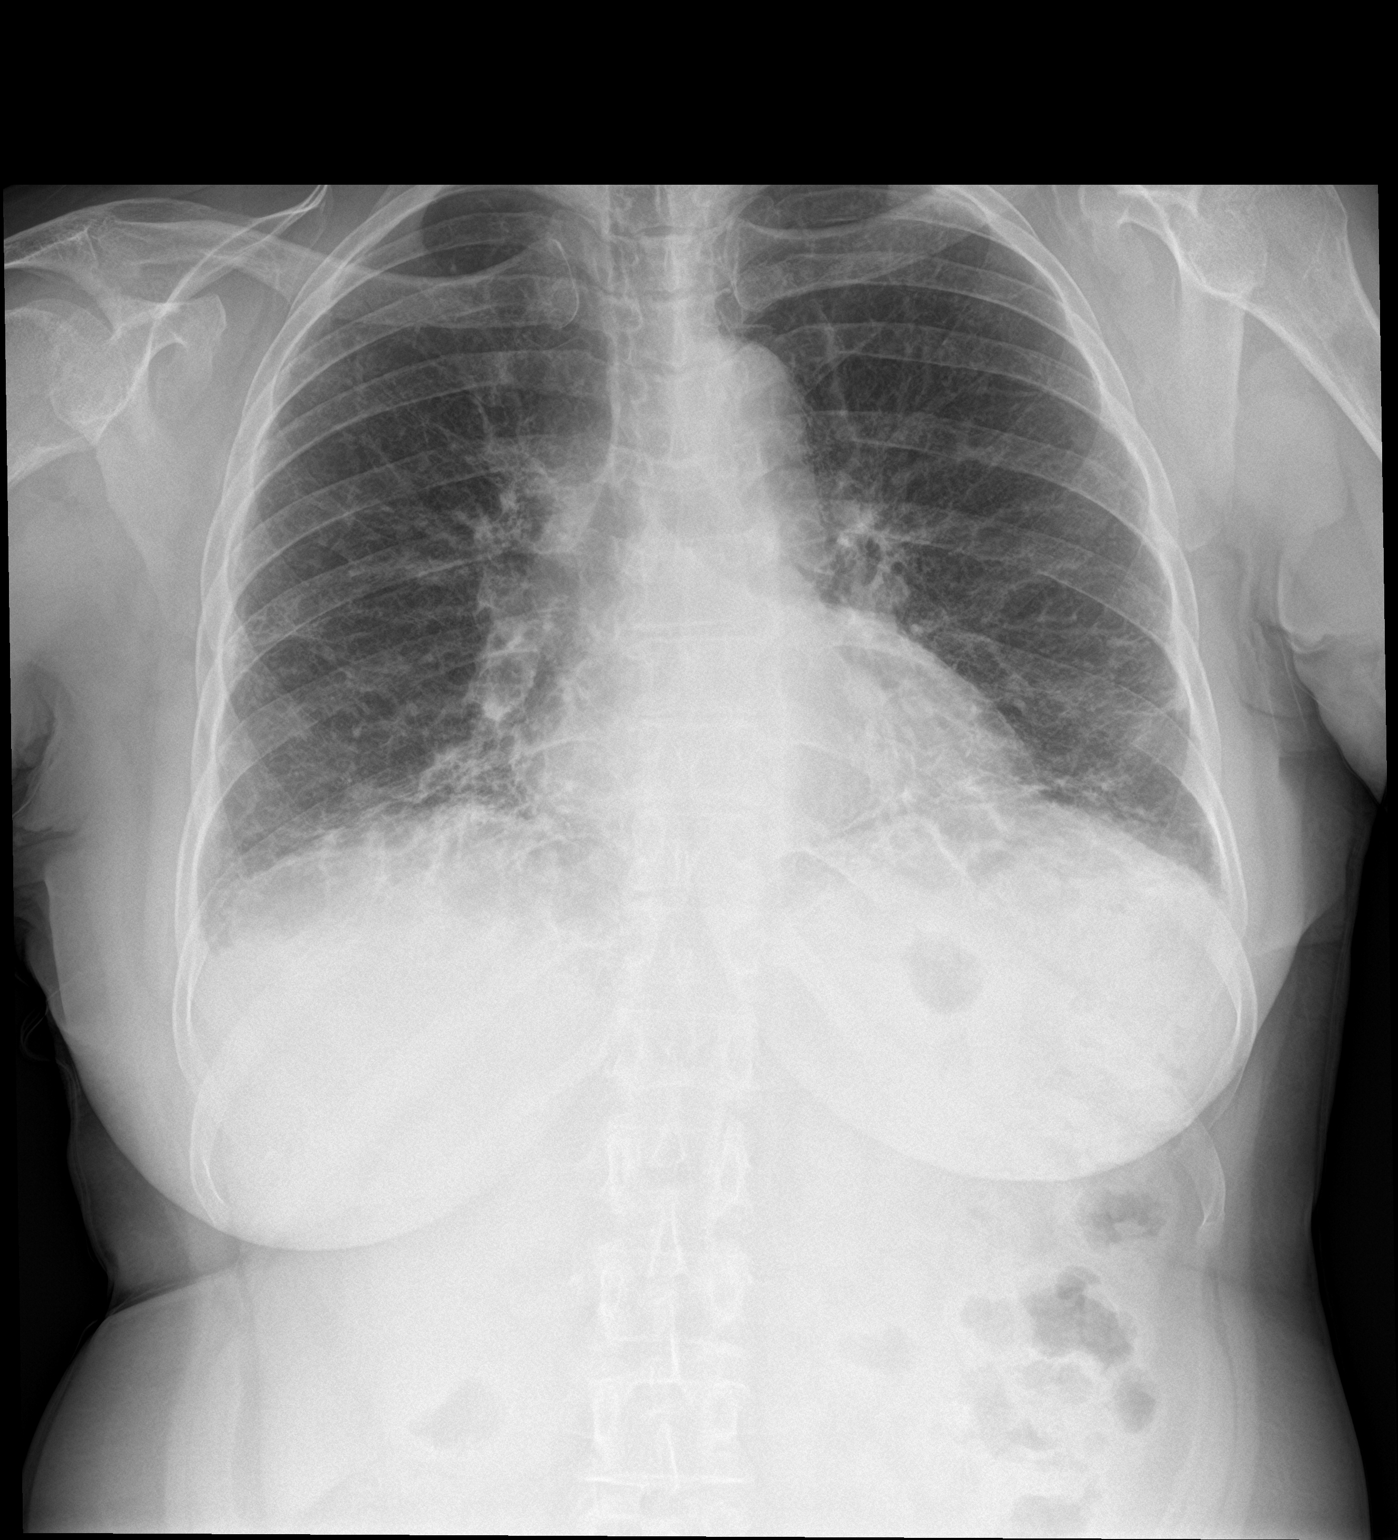

[chest lat]
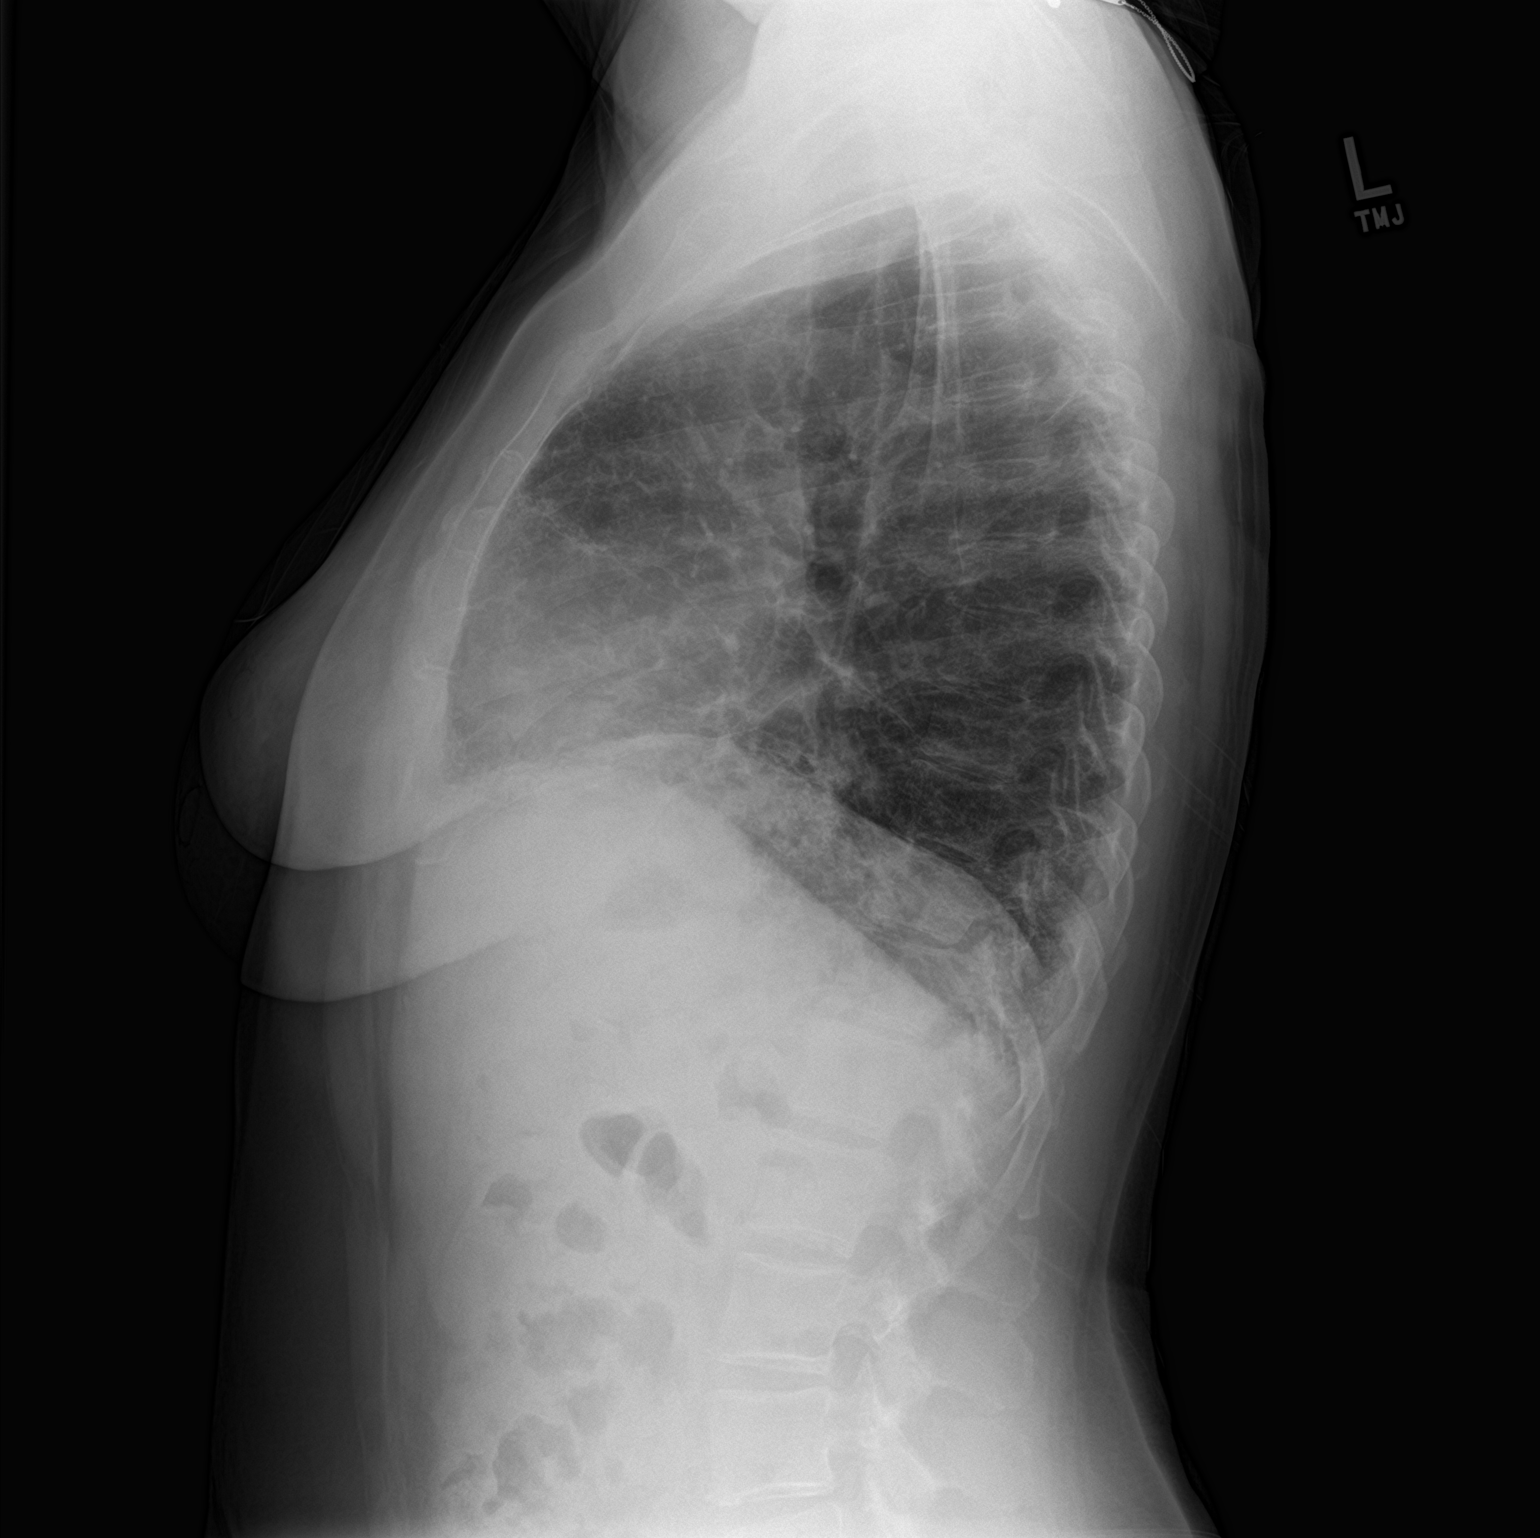

[2 of 2 positions shown; findings below may reference images not displayed]

FINDINGS: Lung volumes are low with coarsening of the pulmonary interstitium
in a basilar predominance. The appearance is not markedly changed.
No pneumothorax or pleural effusion. Heart size is normal. No focal
bony abnormality.
IMPRESSION: Coarsening of the pulmonary interstitium has an appearance most
suggestive of pulmonary fibrosis, unchanged compared to the prior
exam.

## 2017-05-19 ENCOUNTER — Other Ambulatory Visit: Payer: Self-pay

## 2017-05-19 ENCOUNTER — Ambulatory Visit (HOSPITAL_BASED_OUTPATIENT_CLINIC_OR_DEPARTMENT_OTHER)
Admit: 2017-05-19 | Discharge: 2017-05-19 | Disposition: A | Discharge status: Home / Self Care | Payer: Medicare Other | Attending: Emergency Medicine | Admitting: Emergency Medicine

## 2017-05-19 ENCOUNTER — Emergency Department (HOSPITAL_BASED_OUTPATIENT_CLINIC_OR_DEPARTMENT_OTHER)
Admission: EM | Admit: 2017-05-19 | Discharge: 2017-05-19 | Disposition: A | Discharge status: Home / Self Care | Payer: Medicare Other | Attending: Emergency Medicine | Admitting: Emergency Medicine

## 2017-05-19 ENCOUNTER — Encounter (HOSPITAL_BASED_OUTPATIENT_CLINIC_OR_DEPARTMENT_OTHER): Payer: Self-pay | Admitting: *Deleted

## 2017-05-19 DIAGNOSIS — Z87891 Personal history of nicotine dependence: Secondary | ICD-10-CM | POA: Diagnosis not present

## 2017-05-19 DIAGNOSIS — M79604 Pain in right leg: Secondary | ICD-10-CM

## 2017-05-19 DIAGNOSIS — Z79899 Other long term (current) drug therapy: Secondary | ICD-10-CM | POA: Diagnosis not present

## 2017-05-19 MED ORDER — HYDROCODONE-ACETAMINOPHEN 5-325 MG PO TABS
1.0000 | ORAL_TABLET | Freq: Once | ORAL | Status: AC
  Administered 2017-05-19: 1 via ORAL
  Filled 2017-05-19: qty 1

## 2017-05-19 MED ORDER — NAPROXEN 500 MG PO TABS: 500.0000 mg | ORAL_TABLET | Freq: Two times a day (BID) | ORAL | 0 refills | Status: AC

## 2017-05-19 MED ORDER — KETOROLAC TROMETHAMINE 30 MG/ML IJ SOLN
30.0000 mg | Freq: Once | INTRAMUSCULAR | Status: AC
  Administered 2017-05-19: 30 mg via INTRAMUSCULAR
  Filled 2017-05-19: qty 1

## 2017-05-19 NOTE — ED Triage Notes (Addendum)
C/o right leg pain that started several weeks ago. Denies any injury. States pain comes and goes. States she has been taking tylenol with relief. States tonight pain has been constant and tylenol has not been relieving pain. States pain starts in her ankle and radiates up her right. Denies any injury. Describes as an aching type pain.

## 2017-05-19 NOTE — Discharge Instructions (Signed)
You were seen today for right leg pain.  Cause is unknown.  You need to return later today to have an ultrasound to evaluate for blood clots.  You may take naproxen in addition to Tylenol but limit use to 3-5 days.

## 2017-05-19 NOTE — ED Notes (Signed)
Pt discharged to home family. NAD.  

## 2017-05-19 NOTE — ED Provider Notes (Signed)
[facility] EMERGENCY DEPARTMENT Provider Note   CSN: [id number] Arrival date & time: 05/19/17  0227     History   Chief Complaint Chief Complaint  Patient presents with  . right leg pain    HPI Madeline Harper is a 67 y.o. female.  HPI  This is a 68 year old female with a history of autoimmune mixed connective tissue disease, degenerative disc disease who presents with right leg pain.  Patient reports several week history of intermittent right lower leg pain.  She states the pain is aching and radiates from her ankle up to her knee.  Pain is not worsened with ambulation.  She states that she has taken Tylenol with some relief.  However tonight her pain increased.  Current pain is 10 out of 10.  She denies any history of blood clots, recent travel, recent hospitalization.  She denies any injury.  Past Medical History:  Diagnosis Date  . Autoimmune disease (HCC)   . Bone spur of foot   . Cataract   . DDD (degenerative disc disease), cervical 01/26/2017    Patient Active Problem List   Diagnosis Date Noted  . DDD (degenerative disc disease), cervical 01/26/2017  . Mixed connective tissue disease (HCC) 07/12/2016  . UIP (usual interstitial pneumonitis) (HCC) - ILD due to [facility] 06/26/2016  . High risk medication use 05/28/2016  . Raynaud's disease without gangrene 05/28/2016  . Bilateral foot pain 03/27/2016  . Trigger index finger of right hand 03/27/2016  . Elevated sedimentation rate 02/28/2016  . Positive ANA (antinuclear antibody) 02/27/2016  . Bilateral shoulder pain 02/27/2016  . Status post left foot surgery 06/28/2015  . Bone spur of foot 06/14/2015    Past Surgical History:  Procedure Laterality Date  . CATARACT EXTRACTION, BILATERAL    . FOOT SURGERY    . KIDNEY STONE SURGERY  1972  . TUBAL LIGATION  1970    OB History    No data available       Home Medications    Prior to Admission medications   Medication Sig Start Date End Date Taking?  Authorizing Provider  acetaminophen (TYLENOL 8 HOUR ARTHRITIS PAIN) 650 MG CR tablet Take 650 mg by mouth every 8 (eight) hours as needed for pain.    [provider]  azaTHIOprine (IMURAN) 50 MG tablet Take 2 tablets (100 mg total) by mouth daily. 10/24/16   Pollyann Savoy, MD  calcium carbonate (CALCIUM 600) 600 MG TABS tablet Take 600 mg by mouth daily.    [provider]  Ergocalciferol (VITAMIN D2 PO) Take 2,000 mg by mouth once a week.    [provider]  estradiol (ESTRACE VAGINAL) 0.1 MG/GM vaginal cream as needed.  01/05/16   [provider]  guaiFENesin (MUCINEX) 600 MG 12 hr tablet Take 1 tablet (600 mg total) by mouth 2 (two) times daily. 12/24/16   Kalman Shan, MD  hydroxychloroquine (PLAQUENIL) 200 MG tablet Take 300 mg by mouth. 12/11/16   [provider]  Multiple Vitamins-Minerals (MULTI ADULT GUMMIES PO) Take 1 tablet by mouth daily.    [provider]  naproxen (NAPROSYN) 500 MG tablet Take 1 tablet (500 mg total) by mouth 2 (two) times daily. Limit [place] to 3-5 days 05/19/17   Oma Alpert, Mayer Masker, MD  Pyridoxine HCl (VITAMIN B-6) 500 MG tablet Take 500 mg by mouth daily.    [provider]  vitamin B-12 (CYANOCOBALAMIN) 1000 MCG tablet Take 1,000 mcg by mouth daily.    [provider]    Family History Family History  Problem Relation Age of Onset  . Diabetes Mother   . Heart disease Mother   . Kidney failure Mother   . Cancer Mother        Uterine Cancer  . Diabetes Maternal Grandmother   . Kidney failure Maternal Grandmother   . Stroke Sister   . Gout Maternal Grandfather     Social History Social History   Tobacco Use  . Smoking status: Former Smoker    Packs/day: 2.00    Years: 15.00    Pack years: 30.00    Types: Cigarettes    Last attempt to quit: 04/08/1980    Years since quitting: 37.1  . Smokeless tobacco: Never Used  Substance Use Topics  . Alcohol use: Yes    Alcohol/week: 0.0  oz    Comment: Occ  . Drug use: No     Allergies   Lyrica [pregabalin]   Review of Systems Review of Systems  Constitutional: Negative for fever.  Respiratory: Negative for shortness of breath.   Cardiovascular: Negative for chest pain and leg swelling.  Musculoskeletal:       Right leg pain  Skin: Negative for color change and wound.  Neurological: Negative for weakness and numbness.  All other systems reviewed and are negative.    Physical Exam Updated Vital Signs BP 124/78 (BP Location: Right Arm)   Pulse 83   Temp 98.3 F (36.8 C) (Oral)   Resp 14   SpO2 98%   Physical Exam  Constitutional: She is oriented to person, place, and time. She appears well-developed and well-nourished. No distress.  HENT:  Head: Normocephalic and atraumatic.  Cardiovascular: Normal rate and regular rhythm.  Pulmonary/Chest: Effort normal. No respiratory distress.  Musculoskeletal:  Normal range of motion of the right knee and ankle, no obvious asymmetric swelling, tenderness to palpation both anteriorly and posteriorly of the right lower leg also tenderness to the popliteal fossa, 2+ DP pulse, no overlying skin changes or erythema  Neurological: She is alert and oriented to person, place, and time.  Skin: Skin is warm and dry.  Psychiatric: She has a normal mood and affect.  Nursing note and vitals reviewed.    ED Treatments / Results  Labs (all labs ordered are listed, but only abnormal results are displayed) Labs Reviewed - No data to display  EKG  EKG Interpretation None       Radiology No results found.  Procedures Procedures (including critical care time)  Medications Ordered in ED Medications  ketorolac (TORADOL) 30 MG/ML injection 30 mg (30 mg Intramuscular Given 05/19/17 0314)  HYDROcodone-acetaminophen (NORCO/VICODIN) 5-325 MG per tablet 1 tablet (1 tablet Oral Given 05/19/17 0314)     Initial Impression / Assessment and Plan / ED Course  I have reviewed  the triage vital signs and the nursing notes.  Pertinent labs & imaging results that were available during my care of the patient were reviewed by me and considered in my medical decision making (see chart for details).     Presents with intermittent right leg pain.  Nontoxic appearing.  Vital signs reassuring.  Patient was given Norco and Toradol.  Low suspicion for infectious process such as cellulitis given exam.  No history of injury.  Patient feels her symptoms may be related to her autoimmune disease.  Also consideration DVT or ruptured Baker's cyst.  Will have patient return for an ultrasound.  On recheck, she states she feels better.  Recommend adding  naproxen with a maximal use of 3-5 days.  After history, exam, and medical workup I feel the patient has been appropriately medically screened and is safe for discharge home. Pertinent diagnoses were discussed with the patient. Patient was given return precautions.   Final Clinical Impressions(s) / ED Diagnoses   Final diagnoses:  Right leg pain    ED Discharge Orders        Ordered    US Venous Img Lower Unilateral Right     05/19/17 0408    naproxen (NAPROSYN) 500 MG tablet  2 times daily     05/19/17 0409       Hazem Kenner, Mayer Masker, MD 05/19/17 [phone]

## 2017-05-20 NOTE — Progress Notes (Deleted)
[facility] [address], [address] [place], [place] [address] [phone] [phone]  Date:  05/22/2017   Name:  Madeline Harper   DOB:  1950-09-09   MRN:  [id number]  PCP:  Pearline Cablesopland, Jessica C, MD    Chief Complaint: No chief complaint on file.   History of Present Illness:  Madeline Harper is a 67 y.o. very pleasant female patient who presents with the following: History of mixed connective tissue disorder, cervical DDD, interstitial pneumonitis  She is a former smoker Rheumatology: [facility] Following up from an ER visit today Seen on 2/11 with leg pain:  Presents with intermittent right leg pain.  Nontoxic appearing.  Vital signs reassuring.  Patient was given Norco and Toradol.  Low suspicion for infectious process such as cellulitis given exam.  No history of injury.  Patient feels her symptoms may be related to her autoimmune disease.  Also consideration DVT or ruptured Baker's cyst.  Will have patient return for an ultrasound.  On recheck, she states she feels better.  Recommend adding naproxen with a maximal use of 3-5 days.   Patient Active Problem List   Diagnosis Date Noted  . DDD (degenerative disc disease), cervical 01/26/2017  . Mixed connective tissue disease (HCC) 07/12/2016  . UIP (usual interstitial pneumonitis) (HCC) - [facility]  . High risk medication use 05/28/2016  . Raynaud's disease without gangrene 05/28/2016  . Bilateral foot pain 03/27/2016  . Trigger index finger of right hand 03/27/2016  . Elevated sedimentation rate 02/28/2016  . Positive ANA (antinuclear antibody) 02/27/2016  . Bilateral shoulder pain 02/27/2016  . Status post left foot surgery 06/28/2015  . Bone spur of foot 06/14/2015    Past Medical History:  Diagnosis Date  . Autoimmune disease (HCC)   . Bone spur of foot   . Cataract   . DDD (degenerative disc disease), cervical 01/26/2017    Past Surgical History:  Procedure  Laterality Date  . CATARACT EXTRACTION, BILATERAL    . FOOT SURGERY    . KIDNEY STONE SURGERY  1972  . TUBAL LIGATION  1970    Social History   Tobacco Use  . Smoking status: Former Smoker    Packs/day: 2.00    Years: 15.00    Pack years: 30.00    Types: Cigarettes    Last attempt to quit: 04/08/1980    Years since quitting: 37.1  . Smokeless tobacco: Never Used  Substance Use Topics  . Alcohol use: Yes    Alcohol/week: 0.0 oz    Comment: Occ  . Drug use: No    Family History  Problem Relation Age of Onset  . Diabetes Mother   . Heart disease Mother   . Kidney failure Mother   . Cancer Mother        Uterine Cancer  . Diabetes Maternal Grandmother   . Kidney failure Maternal Grandmother   . Stroke Sister   . Gout Maternal Grandfather     Allergies  Allergen Reactions  . Lyrica [Pregabalin] Swelling    Pedal edema     Medication list has been reviewed and updated.  Current Outpatient Medications on File Prior to Visit  Medication Sig Dispense Refill  . acetaminophen (TYLENOL 8 HOUR ARTHRITIS PAIN) 650 MG CR tablet Take 650 mg by mouth every 8 (eight) hours as needed for pain.    Marland Kitchen. azaTHIOprine (IMURAN) 50 MG tablet Take 2 tablets (100 mg total) by  mouth daily. 60 tablet 2  . calcium carbonate (CALCIUM 600) 600 MG TABS tablet Take 600 mg by mouth daily.    . Ergocalciferol (VITAMIN D2 PO) Take 2,000 mg by mouth once a week.    . estradiol (ESTRACE VAGINAL) 0.1 MG/GM vaginal cream as needed.     Marland Kitchen guaiFENesin (MUCINEX) 600 MG 12 hr tablet Take 1 tablet (600 mg total) by mouth 2 (two) times daily. 60 tablet 0  . hydroxychloroquine (PLAQUENIL) 200 MG tablet Take 300 mg by mouth.    . Multiple Vitamins-Minerals (MULTI ADULT GUMMIES PO) Take 1 tablet by mouth daily.    . naproxen (NAPROSYN) 500 MG tablet Take 1 tablet (500 mg total) by mouth 2 (two) times daily. Limit [place] to 3-5 days 10 tablet 0  . Pyridoxine HCl (VITAMIN B-6) 500 MG tablet Take 500 mg by mouth daily.     . vitamin B-12 (CYANOCOBALAMIN) 1000 MCG tablet Take 1,000 mcg by mouth daily.     No current facility-administered medications on file prior to visit.     Review of Systems:  As per HPI- otherwise negative.   Physical Examination: There were no vitals filed for this visit. There were no vitals filed for this visit. There is no height or weight on file to calculate BMI. Ideal Body Weight:    GEN: WDWN, NAD, Non-toxic, A & O x 3 HEENT: Atraumatic, Normocephalic. Neck supple. No masses, No LAD. Ears and Nose: No external deformity. CV: RRR, No M/G/R. No JVD. No thrill. No extra heart sounds. PULM: CTA B, no wheezes, crackles, rhonchi. No retractions. No resp. distress. No accessory muscle use. ABD: S, NT, ND, +BS. No rebound. No HSM. EXTR: No c/c/e NEURO Normal gait.  PSYCH: Normally interactive. Conversant. Not depressed or anxious appearing.  Calm demeanor.    Assessment and Plan: ***  Signed Abbe Amsterdam, MD

## 2017-05-21 ENCOUNTER — Ambulatory Visit (HOSPITAL_BASED_OUTPATIENT_CLINIC_OR_DEPARTMENT_OTHER)
Admission: RE | Admit: 2017-05-21 | Discharge: 2017-05-21 | Disposition: A | Discharge status: Home / Self Care | Payer: Medicare Other | Source: Ambulatory Visit | Attending: Internal Medicine | Admitting: Internal Medicine

## 2017-05-21 DIAGNOSIS — R079 Chest pain, unspecified: Secondary | ICD-10-CM | POA: Insufficient documentation

## 2017-05-21 DIAGNOSIS — R0689 Other abnormalities of breathing: Secondary | ICD-10-CM | POA: Diagnosis not present

## 2017-05-21 DIAGNOSIS — R0600 Dyspnea, unspecified: Secondary | ICD-10-CM | POA: Diagnosis not present

## 2017-05-21 DIAGNOSIS — I071 Rheumatic tricuspid insufficiency: Secondary | ICD-10-CM | POA: Insufficient documentation

## 2017-05-21 DIAGNOSIS — J84112 Idiopathic pulmonary fibrosis: Secondary | ICD-10-CM

## 2017-05-21 DIAGNOSIS — M351 Other overlap syndromes: Secondary | ICD-10-CM

## 2017-05-21 NOTE — Progress Notes (Signed)
Echocardiogram 2D Echocardiogram has been performed.  Madeline Harper, Madeline Harper 05/21/2017, 11:01 AM

## 2017-05-22 ENCOUNTER — Ambulatory Visit (HOSPITAL_COMMUNITY): Payer: Medicare Other

## 2017-05-22 ENCOUNTER — Ambulatory Visit: Payer: Medicare Other | Admitting: Family Medicine

## 2017-05-24 NOTE — Progress Notes (Signed)
[facility] [address] [place], [place] [address] [id number] [phone]  Date:  05/26/2017   Name:  Madeline Harper   DOB:  1950/04/12   MRN:  [id number]  PCP:  Pearline Cables, MD    Chief Complaint: Follow-up (Pt here for ER follow up visit on right leg pain. )   History of Present Illness:  Madeline Harper is a 67 y.o. very pleasant female patient who presents with the following:  Following up from the ER on 2/11  Presents with intermittent right leg pain.  Nontoxic appearing.  Vital signs reassuring.  Patient was given Norco and Toradol.  Low suspicion for infectious process such as cellulitis given exam.  No history of injury.  Patient feels her symptoms may be related to her autoimmune disease.  Also consideration DVT or ruptured Baker's cyst.  Will have patient return for an ultrasound.  On recheck, she states she feels better.  Recommend adding naproxen with a maximal use of 3-5 days.  I last saw her in November- she has history of mixed connective tissue disorder, rheumatologist at [facility]   She had noted the RIGHT lower leg pain "off and on" for about a month, responded to tylenol. A week ago it hurt so bad that she was in tears, and her husband took her to the ER as above Her [place] was ok, and she was treated for pain in the ER- helped  She is using some naproxen OTC as needed- she does not want the pain to get out of hand again She has been told to limit her NSAID use in the past  NKI, no other unusual pains She has not had this in the past  Her next rheumatology appt is 5/2 She gets labs q 3 months per rheumatology  No concerns with ulcers/ stomach bleeding  NCCSR: nothing of concern  BP Readings from Last 3 Encounters:  05/26/17 (!) 155/80  05/19/17 124/87  05/16/17 118/62    Patient Active Problem List   Diagnosis Date Noted  . DDD (degenerative disc disease), cervical 01/26/2017  . Mixed connective  tissue disease (HCC) 07/12/2016  . 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 High risk medication use 05/28/2016  . Raynaud's disease without gangrene 05/28/2016  . Bilateral foot pain 03/27/2016  . Trigger index finger of right hand 03/27/2016  . Elevated sedimentation rate 02/28/2016  . Positive ANA (antinuclear antibody) 02/27/2016  . Bilateral shoulder pain 02/27/2016  . Status post left foot surgery 06/28/2015  . Bone spur of foot 06/14/2015    Past Medical History:  Diagnosis Date  . Autoimmune disease (HCC)   . Bone spur of foot   . Cataract   . DDD (degenerative disc disease), cervical 01/26/2017    Past Surgical History:  Procedure Laterality Date  . CATARACT EXTRACTION, BILATERAL    . FOOT SURGERY    . KIDNEY STONE SURGERY  1972  . TUBAL LIGATION  1970    Social History   Tobacco Use  . Smoking status: Former Smoker    Packs/day: 2.00    Years: 15.00    Pack years: 30.00    Types: Cigarettes    Last attempt to quit: 04/08/1980    Years since quitting: 37.1  . Smokeless tobacco: Never Used  Substance Use Topics  . Alcohol use: Yes    Alcohol/week: 0.0 oz    Comment: Occ  .  Drug use: No    Family History  Problem Relation Age of Onset  . Diabetes Mother   . Heart disease Mother   . Kidney failure Mother   . Cancer Mother        Uterine Cancer  . Diabetes Maternal Grandmother   . Kidney failure Maternal Grandmother   . Stroke Sister   . Gout Maternal Grandfather     Allergies  Allergen Reactions  . Lyrica [Pregabalin] Swelling    Pedal edema     Medication list has been reviewed and updated.  Current Outpatient Medications on File Prior to Visit  Medication Sig Dispense Refill  . acetaminophen (TYLENOL 8 HOUR ARTHRITIS PAIN) 650 MG CR tablet Take 650 mg by mouth every 8 (eight) hours as needed for pain.    Marland Kitchen azaTHIOprine (IMURAN) 50 MG tablet Take 2 tablets (100 mg total) by mouth daily. 60 tablet 2  .  calcium carbonate (CALCIUM 600) 600 MG TABS tablet Take 600 mg by mouth daily.    . Ergocalciferol (VITAMIN D2 PO) Take 2,000 mg by mouth once a week.    . estradiol (ESTRACE VAGINAL) 0.1 MG/GM vaginal cream as needed.     Marland Kitchen guaiFENesin (MUCINEX) 600 MG 12 hr tablet Take 1 tablet (600 mg total) by mouth 2 (two) times daily. 60 tablet 0  . hydroxychloroquine (PLAQUENIL) 200 MG tablet Take 300 mg by mouth.    . Multiple Vitamins-Minerals (MULTI ADULT GUMMIES PO) Take 1 tablet by mouth daily.    . naproxen (NAPROSYN) 500 MG tablet Take 1 tablet (500 mg total) by mouth 2 (two) times daily. Limit [place] to 3-5 days 10 tablet 0  . Pyridoxine HCl (VITAMIN B-6) 500 MG tablet Take 500 mg by mouth daily.    . vitamin B-12 (CYANOCOBALAMIN) 1000 MCG tablet Take 1,000 mcg by mouth daily.     No current facility-administered medications on file prior to visit.     Review of Systems:  As per HPI- otherwise negative. No fever No weight change   Physical Examination: Vitals:   05/26/17 1138 05/26/17 1140  BP: (!) 159/76 (!) 155/80  Pulse: 83   Temp: 98.5 F (36.9 C)    Vitals:   05/26/17 1138  Weight: 132 lb 6.4 oz (60.1 kg)  Height: 4\' 11"  (1.499 m)   Body mass index is 26.74 kg/m. Ideal Body Weight: Weight in (lb) to have BMI = 25: 123.5  GEN: WDWN, NAD, Non-toxic, A & O x 3, petite build, looks well  HEENT: Atraumatic, Normocephalic. Neck supple. No masses, No LAD. Ears and Nose: No external deformity. CV: RRR, No M/G/R. No JVD. No thrill. No extra heart sounds. PULM: CTA B, no wheezes, crackles, rhonchi. No retractions. No resp. distress. No accessory muscle use. EXTR: No c/c/e NEURO Normal gait.  PSYCH: Normally interactive. Conversant. Not depressed or anxious appearing.  Calm demeanor.  No tenderness or abnl to exam of her right lower leg. Foot with normal pulse, warm and well perfused. No pain with ROM of the knee, ankle or foot No swelling or redness  Dg Tibia/fibula  Right  Result Date: 05/26/2017 CLINICAL DATA:  Right lower leg pain for the past month. No injury or trauma. EXAM: RIGHT TIBIA AND FIBULA - 2 VIEW COMPARISON:  None. FINDINGS: There is no evidence of fracture or other focal bone lesions. Soft tissues are unremarkable. IMPRESSION: Negative. Electronically Signed   By: Obie Dredge M.D.   On: 05/26/2017 13:43   US Venous Img  Lower Unilateral Right  Result Date: 05/19/2017 CLINICAL DATA:  Right leg pain. EXAM: RIGHT LOWER EXTREMITY VENOUS DOPPLER ULTRASOUND TECHNIQUE: Gray-scale sonography with graded compression, as well as color Doppler and duplex ultrasound were performed to evaluate the lower extremity deep venous systems from the level of the common femoral vein and including the common femoral, femoral, profunda femoral, popliteal and calf veins including the posterior tibial, peroneal and gastrocnemius veins when visible. The superficial great saphenous vein was also interrogated. Spectral Doppler was utilized to evaluate flow at rest and with distal augmentation maneuvers in the common femoral, femoral and popliteal veins. COMPARISON:  None. FINDINGS: Contralateral Common Femoral Vein: Respiratory phasicity is normal and symmetric with the symptomatic side. No evidence of thrombus. Normal compressibility. Common Femoral Vein: No evidence of thrombus. Normal compressibility, respiratory phasicity and response to augmentation. Saphenofemoral Junction: No evidence of thrombus. Normal compressibility and flow on color Doppler imaging. Profunda Femoral Vein: No evidence of thrombus. Normal compressibility and flow on color Doppler imaging. Femoral Vein: No evidence of thrombus. Normal compressibility, respiratory phasicity and response to augmentation. Popliteal Vein: No evidence of thrombus. Normal compressibility, respiratory phasicity and response to augmentation. Calf Veins: No evidence of thrombus. Normal compressibility and flow on color Doppler  imaging. Superficial Great Saphenous Vein: No evidence of thrombus. Normal compressibility. IMPRESSION: Negative for deep venous thrombosis in right lower extremity. Electronically Signed   By: Richarda OverlieAdam  Henn M.D.   On: 05/19/2017 10:09     Assessment and Plan: Right leg pain - Plan: traMADol (ULTRAM) 50 MG tablet, DG Tibia/Fibula Right  Following up today on right lower leg pain that took her to the ER a week ago.  It has not been as severe as when she went to the ER, but still sore This may be related to her mixed connective tissue disorder- she will call her rheumatologist for a visit rx for tramadol to use as needed for pain Obtain plain films of lower leg today  Message to pt regarding her x-rays today  Signed Abbe AmsterdamJessica Ranell Finelli, MD

## 2017-05-26 ENCOUNTER — Encounter: Payer: Self-pay | Admitting: Family Medicine

## 2017-05-26 ENCOUNTER — Ambulatory Visit (INDEPENDENT_AMBULATORY_CARE_PROVIDER_SITE_OTHER): Payer: Medicare Other | Admitting: Family Medicine

## 2017-05-26 ENCOUNTER — Other Ambulatory Visit (HOSPITAL_COMMUNITY): Payer: Medicare Other

## 2017-05-26 ENCOUNTER — Ambulatory Visit (HOSPITAL_BASED_OUTPATIENT_CLINIC_OR_DEPARTMENT_OTHER)
Admission: RE | Admit: 2017-05-26 | Discharge: 2017-05-26 | Disposition: A | Discharge status: Home / Self Care | Payer: Medicare Other | Source: Ambulatory Visit | Attending: Family Medicine | Admitting: Family Medicine

## 2017-05-26 VITALS — BP 155/80 | HR 83 | Temp 98.5°F | Ht 59.0 in | Wt 132.4 lb

## 2017-05-26 DIAGNOSIS — M79604 Pain in right leg: Secondary | ICD-10-CM | POA: Insufficient documentation

## 2017-05-26 DIAGNOSIS — M79661 Pain in right lower leg: Secondary | ICD-10-CM | POA: Diagnosis not present

## 2017-05-26 MED ORDER — TRAMADOL HCL 50 MG PO TABS: 50.0000 mg | ORAL_TABLET | Freq: Three times a day (TID) | ORAL | 0 refills | Status: DC | PRN

## 2017-05-26 NOTE — Patient Instructions (Addendum)
Good to see you again today- I sent in an rx for tramadol to use as needed for leg pain. Remember this can make you drowsy Ok to also use tylenol/ ibuprofen if needed  I will also get an x-ray of your leg today to make sure no concerning findings   Please let me know if your pain does not remit, and let's have you try to see your rheumatologist sooner if you can

## 2017-06-11 ENCOUNTER — Other Ambulatory Visit (HOSPITAL_BASED_OUTPATIENT_CLINIC_OR_DEPARTMENT_OTHER): Payer: Medicare Other

## 2017-06-16 ENCOUNTER — Ambulatory Visit (INDEPENDENT_AMBULATORY_CARE_PROVIDER_SITE_OTHER): Payer: Medicare Other | Admitting: Internal Medicine

## 2017-06-16 ENCOUNTER — Encounter: Payer: Self-pay | Admitting: Internal Medicine

## 2017-06-16 VITALS — BP 120/78 | HR 79 | Ht 59.75 in | Wt 131.0 lb

## 2017-06-16 DIAGNOSIS — R0600 Dyspnea, unspecified: Secondary | ICD-10-CM | POA: Diagnosis not present

## 2017-06-16 DIAGNOSIS — R0689 Other abnormalities of breathing: Secondary | ICD-10-CM

## 2017-06-16 DIAGNOSIS — J84112 Idiopathic pulmonary fibrosis: Secondary | ICD-10-CM

## 2017-06-16 DIAGNOSIS — M351 Other overlap syndromes: Secondary | ICD-10-CM

## 2017-06-16 DIAGNOSIS — R0982 Postnasal drip: Secondary | ICD-10-CM | POA: Diagnosis not present

## 2017-06-16 LAB — PULMONARY FUNCTION TEST
DL/VA % PRED: 89 %
DL/VA: 3.75 ml/min/mmHg/L
DLCO unc % pred: 53 %
DLCO unc: 9.95 ml/min/mmHg
FEF 25-75 Pre: 0.98 L/sec
FEF2575-%Pred-Pre: 62 %
FEV1-%PRED-PRE: 85 %
FEV1-Pre: 1.35 L
FEV1FVC-%Pred-Pre: 86 %
FEV6-%Pred-Pre: 103 %
FEV6-Pre: 2.01 L
FEV6FVC-%Pred-Pre: 104 %
FVC-%PRED-PRE: 98 %
FVC-PRE: 2.01 L
Pre FEV1/FVC ratio: 67 %
Pre FEV6/FVC Ratio: 100 %

## 2017-06-16 MED ORDER — FLUTICASONE PROPIONATE 50 MCG/ACT NA SUSP: 2.0000 | Freq: Every day | NASAL | 2 refills | Status: DC

## 2017-06-16 NOTE — Progress Notes (Signed)
Subjective:     Patient ID: Madeline Harper, female   DOB: 1951-03-27, 67 y.o.   MRN: [id number]  HPI    HPI  Chief Complaint  Patient presents with  . Pulmonary Consult    Pt referred by Dr. Corliss Skains for abnormal lung sounds. Pt c/o DOE, prod cough with yellow mucus in morning - resolves throughout the day. Pt denies CP/tightness and f/c/s.      Notes from rheumatologist Dr. D reviewed and summarized Patient has a constellation of ANA greater than 1:1280, positive double-stranded DNA, positive RNP, positive Ro, low C3 and C4 associated with elevated sedimentation rate of 115, arthritis and raynaud. 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 During visit on 03/27/2016 based on my summarization of the chart crackles was observed in the flowsheet been referred here.  Madeline Harper presents for evaluation today. She tells me that for the last 1 year she's had arthralgia and Raynaud symptoms this then resulted in the mixed connective tissue diagnosis as stated above. She also tells me for the last 1 year after having a cold she's had chronic cough associated with sputum production. It is particularly worse in the daytime but she does cough during the rest of the day. She also tells me that she has mild dyspnea and exertion for climbing stairs. She tells me that she works out regularly at [employer] but she does not feel dyspneic although she demonstrated that she has labored breathing. This all improved with rest. 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 She is also wondering if the doctors of performing unnecessary tests on her.   Results for Madeline Harper, Madeline Harper (MRN [id number]) as of 05/22/2016 12:00  Ref. Range 02/28/2016 09:59  Creatinine Latest Ref Range: 0.50 - 0.99 mg/dL 5.62    She had a chest x-ray 03/27/2016: Shows coarse interstitial markings suggestive of interstitial lung  disease  Results for Madeline Harper, Madeline Harper (MRN [id number]) as of 05/22/2016 12:00  Ref. Range 02/28/2016 09:59  Cyclic Citrullin Peptide Ab Latest Units: Units <16  ds DNA Ab Latest Units: IU/mL 14 (H)  C3 Complement Latest Ref Range: 90 - 180 mg/dL 40 (L)  C4 Complement Latest Ref Range: 16 - 47 mg/dL 8 (L)  IgG (Immunoglobin G), Serum Latest Ref Range: 694 - 1,618 mg/dL 6,962 (H)  IgA Latest Ref Range: 81 - 463 mg/dL 952  WUXLK-4-MWNUUVOZ Latest Ref Range: 0.2 - 0.3 g/dL 0.3  DGUYQ-0-HKVQQVZD Latest Ref Range: 0.5 - 0.9 g/dL 0.8  Ribonucleic Protein(ENA) Antibody, IgG Latest Ref Range: <1.0 NEG AI  >8.0 POS (H)  SSA (Ro) (ENA) Antibody, IgG Latest Ref Range: <1.0 NEG AI  5.7 POS (H)  SSB (La) (ENA) Antibody, IgG Latest Ref Range: <1.0 NEG AI  <1.0 NEG  Scleroderma (Scl-70) (ENA) Antibody, IgG Latest Ref Range: <1.0 NEG AI  <1.0 NEG      has a past medical history of Bone spur of foot.   reports that she quit smoking about 36 years ago. Her smoking use included Cigarettes. She has a 30.00 pack-year smoking history. She has never used smokeless tobacco.   OV 06/26/2016  Chief Complaint  Patient presents with  . Follow-up    Pt here after CT chest, PFT, and echo. Pt deneis change in breahting since last OV.    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 In the  interim she did see Dr. Algis Downs on 05/28/2016 and has been recommended Imuran based on her symptoms, autoimmune profile and presence of UIP on CT chest. The show that she has grade 1 diastolic dysfunction associated borderline elevation of pulmonary artery systolic pressure which could easily be due to grade 1 diastolic dysfunction. 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 Based on pulmonary function testing and is only mild severe   The overwhelming thing for this visit is that she is extremely scared to take Imuran  another immune suppressants. She is worried about the cancer risk. We talked about the spiritual distress that she is in. We talked about the pros and cons of these medications. We talked about the risk of disease progression particularly pulmonary fibrosis. We talked about the future of drugs that are less toxic being developed such as Pirfenidone (Esbriet) and Ofev . We talked about  having to manage her chronic disease not be in denial. After all this she was somewhat reassured we talked about the need to monitor  She does have early morning cough with some mild sputum for which she wants symptom relief   Echocardiogram 05/29/16: Shows ejection fraction of 65% with grade 1 diastoic dysfunction and a PSA systolic pressure off 28 mmHg  IMPRESSION: 1. 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 2. Aortic atherosclerosis.   Electronically Signed   By: Trudie Reed M.D.   On: 05/28/2016 14:41   OV 12/23/2016   Chief Complaint  Patient presents with  . Follow-up    Pt states that when she goes up the stairs, she becomes SOB and has to sit down. With just walking she states that she does not become SOB. C/o prod. cough in the A.M with yellow phlem.    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  Last visit was in March 2018. Because of the UIP pattern and despite mild interstitial lung disease and ongoing significant joint symptoms I supported the recommendation of the Imuran. Review of the chart from the rheumatologist shows that she was started on Imuran April 2018 at 50 mg per day. Then on 08/20/2016 it was increased to 75 mg per day. Currently she is on 100 mg per day of Imuran Her prednisone was tapered to 5 mg per day by 08/30/2016. In her most recent visit 10/24/2016 with Dr. Algis Downs it appears that she was still having  significant pain in her shoulders and spine from her mixed connective tissue disease. PAtient tells me 12/23/2016 after that visit she was then recommended to restart prednisone per Dr D (she was weaned off by then). So, she says she felt frustrated. She was then referred to [facility] sawe Dr Artis Flock 9/.5/18 and plaquenil added at this visit. Continue on  Immuran. Patient does not know if at this stage for joint symptoms are well-controlled with the addition of Plaquenil because it is only been 12 days or so. She is a little bit frustrated with the direction of her autoimmune therapy. In discussion with Dr. Algis Downs today over the telephone and it is felt that it is best that her autoimmune diseases is best served at [facility]. In talking to the patient she is not doing any physical therapy anymore or doing water aerobics. Her main joint issue appears to be stiffness especially early in the morning particularly the right shoulder  In terms of her  symptoms she stable. The dyspnea is only mild. Her main issues that early morning she's having congestion with mild yellow sputum that is stable for many years. This no associated worsening of shortness of breath. This no change in color of sputum or wheezing. Her pulmonary function test shows that her ILD stable compared to last visit.  OV 05/16/2017  Chief Complaint  Patient presents with  . Follow-up    breathing when up the stairs SOB, cough mostly in the am occ yellow thick mucus in am. 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  Imuran and prednisone being monitored and [facility]  Overall patient is doing well.  But when she saw Dr. Sheppard PentonWolf in April 14, 2017 [facility] she complained of worsening shortness of breath.  She tells me the same.  This is noticed particularly when climbing stairs.  Otherwise her health is in good shape.  She is  exercising regularly.  So she does not think deconditioning is an issue.  King's interstitial lung disease questionnaire shows that his only dyspnea on exertion on the stairs that is worse but otherwise she is doing well.  She is compliant with her Imuran and CellCept.  Walking desaturation test today did not show any desaturations but she is been tachycardic at baseline.  She is noted to have some amount of diastolic dysfunction.    Walking desaturation test on 05/16/2017 185 feet x 3 laps on ROOM AIR:  did not desaturate. Rest pulse ox was 100%, final pulse ox was 100%. HR response 99/min at rest to 115/min at peak exertion. Patient Kanyia Dan HumphreysWalker  Did nto Desaturate < 88% . Madeline Harper did nto  Desaturated </= 3% points. Madeline Harper yes get tachyardic    OV 06/16/2017  Chief Complaint  Patient presents with  . Follow-up    PFT done today.  Pt states she has been doing good. States she is still coughing in the mornings with yellow mucus.   FU ILD - autooimune and immune suppressed  DOing well. KBILD better. On immuran. No new issues. Walk test and PFT sable. DLCO some down but ? Effort issue. Recent echo with PASP 30s and gr 1 diast dysfn but she is beter. Walk test stable. REfused flu shot and shingles shot. Having post nasal drip and early AM cough  K-BILD ILD QUESTIONNAIRE, Symptom score over prior 2 weeks  7-none, 6-rarely, 5-occ, 5-some times, 3-sev times, 2-most times, 1-every time 05/16/2017  06/16/2017   Dyspnea for stairs, incline or hill 2 5  Chest Tightness 6 7  Worry about seriousness of lung complaint 6 7  Avoided doing things that make you dyspneic 7 7  Have you felt loss of control of lung condition (reversed from original) 6 6  Felt fed up due to lung condition 7 7  Felt urge to breathe aka air hunger 6 6  Has lung condition made you feel anxious 7 7  How often have you experienced wheezing or whistling sound 7 7  How much of the time have you felt your lung dz is getting  worse 7 7  How much has your lung condition interfered with job or daily task 7 7  Were you expecting your lung condition to get worse 7 7  How much has your lung function limited you carrying things like groceris 7 6  How much has your lung function made you think of EOL? 7 7  Total    Are you financially worse off 7 [address] Total      Results for Madeline Harper, Madeline Harper (MRN [id number]) as of 05/16/2017 11:03  Ref. Range 06/25/2016 11:11 12/23/2016 09:51 06/16/2017   FVC-Pre Latest Units: L 1.93 2.02 2.01  FVC-%Pred-Pre Latest Units: % 93 98 98  Results for Madeline Harper, Madeline Harper (MRN [id number]) as of 05/16/2017 11:03  Ref. Range 06/25/2016 11:11 12/23/2016 09:51 06/16/2017   DLCO unc Latest Units: ml/min/mmHg 13.70 11.37 9.95  DLCO unc % pred Latest Units: % 73 61 53    Walking desaturation test on 06/16/2017 185 feet x 3 laps on ROOM AIR:  did walk on 3 laps moderate pace. Did not desaturate. Rest pulse ox was 100%, final pulse ox was 97%. HR response 79/min at rest to 112/min at peak exertion. Patient Madeline Harper  Did not Desaturate < 88% . Madeline Harper yes did  Desaturated </= 3% points. Madeline Harper yes did get tachyardic    has a past medical history of Autoimmune disease (HCC), Bone spur of foot, Cataract, and DDD (degenerative disc disease), cervical (01/26/2017).   reports that she quit smoking about 37 years ago. Her smoking use included cigarettes. She has a 30.00 pack-year smoking history. she has never used smokeless tobacco.  Past Surgical History:  Procedure Laterality Date  . CATARACT EXTRACTION, BILATERAL    . FOOT SURGERY    . KIDNEY STONE SURGERY  1972  . TUBAL LIGATION  1970    Allergies  Allergen Reactions  . Lyrica [Pregabalin] Swelling    Pedal edema      There is no immunization history on file for this patient.  Family History  Problem Relation Age of Onset  . Diabetes Mother   . Heart disease Mother   . Kidney failure Mother   . Cancer Mother        Uterine Cancer   . Diabetes Maternal Grandmother   . Kidney failure Maternal Grandmother   . Stroke Sister   . Gout Maternal Grandfather      Current Outpatient Medications:  .  acetaminophen (TYLENOL 8 HOUR ARTHRITIS PAIN) 650 MG CR tablet, Take 650 mg by mouth every 8 (eight) hours as needed for pain., Disp: , Rfl:  .  azaTHIOprine (IMURAN) 50 MG tablet, Take 2 tablets (100 mg total) by mouth daily., Disp: 60 tablet, Rfl: 2 .  calcium carbonate (CALCIUM 600) 600 MG TABS tablet, Take 600 mg by mouth daily., Disp: , Rfl:  .  Ergocalciferol (VITAMIN D2 PO), Take 2,000 mg by mouth once a week., Disp: , Rfl:  .  hydroxychloroquine (PLAQUENIL) 200 MG tablet, Take 300 mg by mouth., Disp: , Rfl:  .  Multiple Vitamins-Minerals (MULTI ADULT GUMMIES PO), Take 1 tablet by mouth daily., Disp: , Rfl:  .  Pyridoxine HCl (VITAMIN B-6) 500 MG tablet, Take 500 mg by mouth daily., Disp: , Rfl:  .  vitamin B-12 (CYANOCOBALAMIN) 1000 MCG tablet, Take 1,000 mcg by mouth daily., Disp: , Rfl:  .  estradiol (ESTRACE VAGINAL) 0.1 MG/GM vaginal cream, as needed. , Disp: , Rfl:  .  naproxen (NAPROSYN) 500 MG tablet, Take 1 tablet (500 mg total) by mouth 2 (two) times daily. Limit [place] to 3-5 days (Patient not taking: Reported on 06/16/2017), Disp: 10 tablet, Rfl: 0 .  traMADol (ULTRAM) 50 MG tablet, Take 1 tablet (50 mg total) by mouth every 8 (eight) hours  as needed. (Patient not taking: Reported on 06/16/2017), Disp: 15 tablet, Rfl: 0  Review of Systems     Objective:   Physical Exam  Constitutional: She is oriented to person, place, and time. She appears well-developed and well-nourished. No distress.  HENT:  Head: Normocephalic and atraumatic.  Right Ear: External ear normal.  Left Ear: External ear normal.  Mouth/Throat: Oropharynx is clear and moist. No oropharyngeal exudate.  Eyes: Conjunctivae and EOM are normal. Pupils are equal, round, and reactive to light. Right eye exhibits no discharge. Left eye exhibits no  discharge. No scleral icterus.  Neck: Normal range of motion. Neck supple. No JVD present. No tracheal deviation present. No thyromegaly present.  Cardiovascular: Normal rate, regular rhythm, normal heart sounds and intact distal pulses. Exam reveals no gallop and no friction rub.  No murmur heard. Pulmonary/Chest: Effort normal. No respiratory distress. She has no wheezes. She has rales. She exhibits no tenderness.  Abdominal: Soft. Bowel sounds are normal. She exhibits no distension and no mass. There is no tenderness. There is no rebound and no guarding.  Musculoskeletal: Normal range of motion. She exhibits no edema or tenderness.  Lymphadenopathy:    She has no cervical adenopathy.  Neurological: She is alert and oriented to person, place, and time. She has normal reflexes. No cranial nerve deficit. She exhibits normal muscle tone. Coordination normal.  Skin: Skin is warm and dry. No rash noted. She is not diaphoretic. No erythema. No pallor.  Psychiatric: She has a normal mood and affect. Her behavior is normal. Judgment and thought content normal.  Vitals reviewed.  Vitals:   06/16/17 1014  BP: 120/78  Pulse: 79  SpO2: 100%  Weight: 131 lb (59.4 kg)  Height: 4' 11.75" (1.518 m)       Assessment:       ICD-10-CM   1. 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 MCTD (mixed connective tissue disease) (HCC) M35.1   3. Post-nasal drip R09.82   4. 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 No lung volume or bd response. No post-bd spiro - - recommend flu shot and shingles but respect your decision refuse  Post-nasal drip  take generic fluticasone  inhaler 2 squirts each nostril daily  Dyspnea  - stable and this is due to above + mild stiff heart muschle +/- mild increase in pulmonary pressures - if gets worse, then consider right heart cath   Followup 6 months or sooner with DR Marchelle Gearing - regular clinic or ILD clinic   Dr. Kalman Shan, M.D., [facility].C.P Pulmonary and Critical Care Medicine Staff Physician, [facility] Director - Interstitial Lung Disease  Program  Pulmonary Fibrosis [facility] at [facility] [address] [place], [place], [address]  Pager: [phone], If no answer or between  15:00h - 7:00h: call [phone] Telephone: [phone]

## 2017-06-16 NOTE — Patient Instructions (Addendum)
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 No lung volume or bd response. No post-bd spiro - recommend flu shot and shingles but respect your decision refuse  Post-nasal drip  take generic fluticasone inhaler 2 squirts each nostril daily  Dyspnea  - stable and this is due to above + mild stiff heart muschle +/- mild increase in pulmonary pressures - if gets worse, then consider right heart cath   Followup 6 months or sooner with DR Marchelle Gearingamaswamy - regular clinic or ILD clinic

## 2017-06-16 NOTE — Progress Notes (Signed)
PFT completed today.  

## 2017-06-20 ENCOUNTER — Ambulatory Visit (INDEPENDENT_AMBULATORY_CARE_PROVIDER_SITE_OTHER): Payer: Medicare Other | Admitting: Podiatry

## 2017-06-20 ENCOUNTER — Encounter: Payer: Self-pay | Admitting: Podiatry

## 2017-06-20 DIAGNOSIS — L84 Corns and callosities: Secondary | ICD-10-CM

## 2017-06-20 DIAGNOSIS — M2041 Other hammer toe(s) (acquired), right foot: Secondary | ICD-10-CM | POA: Diagnosis not present

## 2017-06-20 NOTE — Patient Instructions (Signed)
Pre-Operative Instructions  Congratulations, you have decided to take an important step towards improving your quality of life.  You can be assured that the doctors and staff at [facility] will be with you every step of the way.  Here are some important things you should know:  1. Plan to be at the surgery center/hospital at least 1 (one) hour prior to your scheduled time, unless otherwise directed by the surgical center/hospital staff.  You must have a responsible adult accompany you, remain during the surgery and drive you home.  Make sure you have directions to the surgical center/hospital to ensure you arrive on time. 2. If you are having surgery at [facility] or [facility] hospitals, you will need a copy of your medical history and physical form from your family physician within one month prior to the date of surgery. We will give you a form for your primary physician to complete.  3. We make every effort to accommodate the date you request for surgery.  However, there are times where surgery dates or times have to be moved.  We will contact you as soon as possible if a change in schedule is required.   4. No aspirin/ibuprofen for one week before surgery.  If you are on aspirin, any non-steroidal anti-inflammatory medications (Mobic, Aleve, Ibuprofen) should not be taken seven (7) days prior to your surgery.  You make take Tylenol for pain prior to surgery.  5. Medications - If you are taking daily heart and blood pressure medications, seizure, reflux, allergy, asthma, anxiety, pain or diabetes medications, make sure you notify the surgery center/hospital before the day of surgery so they can tell you which medications you should take or avoid the day of surgery. 6. No food or drink after midnight the night before surgery unless directed otherwise by surgical center/hospital staff. 7. No alcoholic beverages 24-hours prior to surgery.  No smoking 24-hours prior or 24-hours after  surgery. 8. Wear loose pants or shorts. They should be loose enough to fit over bandages, boots, and casts. 9. Don't wear slip-on shoes. Sneakers are preferred. 10. Bring your boot with you to the surgery center/hospital.  Also bring crutches or a [place]dorf if your physician has prescribed it for you.  If you do not have this equipment, it will be provided for you after surgery. 11. If you have not been contacted by the surgery center/hospital by the day before your surgery, call to confirm the date and time of your surgery. 12. Leave-time from work may vary depending on the type of surgery you have.  Appropriate arrangements should be made prior to surgery with your employer. 13. Prescriptions will be provided immediately following surgery by your doctor.  Fill these as soon as possible after surgery and take the medication as directed. Pain medications will not be refilled on weekends and must be approved by the doctor. 14. Remove nail polish on the operative foot and avoid getting pedicures prior to surgery. 15. Wash the night before surgery.  The night before surgery wash the foot and leg well with water and the antibacterial soap provided. Be sure to pay special attention to beneath the toenails and in between the toes.  Wash for at least three (3) minutes. Rinse thoroughly with water and dry well with a towel.  Perform this wash unless told not to do so by your physician.  Enclosed: 1 Ice pack (please put in freezer the night before surgery)   1 Hibiclens skin cleaner     Pre-op instructions  If you have any questions regarding the instructions, please do not hesitate to call our office.  [place]: [address], [place], [place] [address] -- [phone]  [place]: [address] [place], [place] [address] -- [phone]  [place]: [address]  [place], [place] [address] -- [phone]  [place]: [address], [address], [place], [place] [address] -- [phone]  Website:  [email or web address] 

## 2017-07-04 ENCOUNTER — Other Ambulatory Visit: Payer: Medicare Other | Admitting: Podiatry

## 2017-07-04 DIAGNOSIS — Z124 Encounter for screening for malignant neoplasm of cervix: Secondary | ICD-10-CM | POA: Diagnosis not present

## 2017-07-04 DIAGNOSIS — N905 Atrophy of vulva: Secondary | ICD-10-CM | POA: Diagnosis not present

## 2017-07-04 DIAGNOSIS — N9411 Superficial (introital) dyspareunia: Secondary | ICD-10-CM | POA: Diagnosis not present

## 2017-07-04 DIAGNOSIS — Z01419 Encounter for gynecological examination (general) (routine) without abnormal findings: Secondary | ICD-10-CM | POA: Diagnosis not present

## 2017-07-04 DIAGNOSIS — N952 Postmenopausal atrophic vaginitis: Secondary | ICD-10-CM | POA: Diagnosis not present

## 2017-07-05 NOTE — Progress Notes (Signed)
  Subjective:  Patient ID: Madeline Harper, female    DOB: 09-16-1950,  MRN: [id number]  Chief Complaint  Patient presents with  . Callouses    i have corns on both little toes and they need to be trimmed    67 y.o. female returns for the above complaint.  Reports painful callus to the right fifth toe.  Has been seen for this issue before.  States that it is getting painful and she cannot care for herself  Objective:  There were no vitals filed for this visit. General AA&O x3. Normal mood and affect.  Vascular Pedal pulses palpable.  Neurologic Epicritic sensation grossly intact.  Dermatologic No open lesions. Skin normal texture and turgor.  Orthopedic:  Fifth toe adductovarus hammertoes with PIPJ callus bilateral   Assessment & Plan:  Patient was evaluated and treated and all questions answered.  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  All risk benefits alternatives explained no guarantees given. Consent reviewed and signed by patient.  15 minutes of face to face time were spent with the patient. >50% of this was spent on counseling and coordination of care. Specifically discussed with patient the above diagnosis and treatment plans. Postoperative course discussed at length.  Patient wishes to proceed with surgery  No follow-ups on file.

## 2017-07-07 DIAGNOSIS — E559 Vitamin D deficiency, unspecified: Secondary | ICD-10-CM | POA: Diagnosis not present

## 2017-07-08 DIAGNOSIS — Z79899 Other long term (current) drug therapy: Secondary | ICD-10-CM | POA: Diagnosis not present

## 2017-07-08 DIAGNOSIS — M351 Other overlap syndromes: Secondary | ICD-10-CM | POA: Diagnosis not present

## 2017-07-09 ENCOUNTER — Telehealth: Payer: Self-pay | Admitting: *Deleted

## 2017-07-09 NOTE — Telephone Encounter (Signed)
I left the patient a message to call me back.  I want to see if she would like to reschedule her surgery that got canceled on 07/02/2017 due to her having coffee the morning of surgery.

## 2017-07-11 ENCOUNTER — Encounter: Payer: Medicare Other | Admitting: Podiatry

## 2017-08-22 ENCOUNTER — Telehealth: Payer: Self-pay | Admitting: Rheumatology

## 2017-08-22 NOTE — Telephone Encounter (Signed)
Patient has been discharge from the practice. She is not able to return to see Dr. Corliss Skains

## 2017-08-22 NOTE — Telephone Encounter (Signed)
Patient called stating she is a former patient of Dr. Corliss Skains who would like to return.  Patient states Dr. Corliss Skains referred her to Dr. Artis Flock at [facility], but she is unable to make the long drive due to all the road construction.  Patient was last seen on 10/24/16.

## 2017-08-27 ENCOUNTER — Telehealth: Payer: Self-pay | Admitting: Rheumatology

## 2017-08-27 NOTE — Telephone Encounter (Signed)
Patient was discharged from your practice in 12/2016 and referred to [facility].

## 2017-08-27 NOTE — Telephone Encounter (Signed)
Patient was discharged from the practice.  She will have to establish with another rheumatologist.

## 2017-08-27 NOTE — Telephone Encounter (Signed)
Patient calling to see if she can come back to see Dr. Corliss Skains. She cannot go to the doctor in [place] that doctor referred her to because of the road work. Patient cannot travel to [place] due to this. Patient did see doctor in [place] twice. Patient will be running out of Plaquenil, and another medication soon. She needs to know what to do. Please call to advise.

## 2017-08-27 NOTE — Telephone Encounter (Signed)
Patient advised she has been discharged from this practice, patient verbalized understanding. Patient then advised that [facility] could refer her to another rheumatologist. Patient asked if Dr. Corliss Skains could refer her and I advised patient that her current rheumatologist would have to place a referral.

## 2017-10-06 DIAGNOSIS — M351 Other overlap syndromes: Secondary | ICD-10-CM | POA: Diagnosis not present

## 2017-10-06 DIAGNOSIS — Z79899 Other long term (current) drug therapy: Secondary | ICD-10-CM | POA: Diagnosis not present

## 2017-11-02 NOTE — Progress Notes (Signed)
[facility] [address], [address] [place], [place] [address] [phone]  Date:  11/05/2017   Name:  Madeline Harper   DOB:  [birth year]   MRN:  [id number]  PCP:  Pearline Cables, MD    Chief Complaint: Medication Refill (Pt here to get refill on azaTHIOprine (IMURAN) 50 MG tablet. Pt has bottle of alprazolam that she would like filled to take for anxiety before a Harper she has coming up soon. This medication is not currently on pt's med)   History of Present Illness:  Madeline Harper is a 67 y.o. very pleasant female patient who presents with the following:  Following up/ medication review today-  I last saw her in February of this year History of of arthritis and mixed connective tissue disease, interstitial pneumonitis  She has been a pt of Ramaswamy for pulmonology and Artis Flock at [facility] for rheumatology -it was getting to be too hard to get out to [place] and she was missing appts due to traffic, etc She is going to be changing over to Dr. 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 No lung volume or bd response. No post-bd spiro - - recommend flu shot and shingles but respect your decision refuse Post-nasal drip  take generic fluticasone inhaler 2 squirts  each nostril daily Dyspnea  - stable and this is due to above + mild stiff heart muschle +/- mild increase in pulmonary pressures - if gets worse, then consider right heart cath Followup 6 months or sooner with DR Marchelle Gearing - regular clinic or ILD clinic  Colon cancer screening: 10/02/12, she did have some polyps but is not sure when she should have this done again  Dexa: 4/16 Immunizations:  NCC[facility]- tramadol in February only entry Patient Active Problem List   Diagnosis Date Noted  . DDD (degenerative disc disease), cervical 01/26/2017  . Mixed connective tissue disease (HCC) 07/12/2016  . 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 High risk medication use 05/28/2016  . Raynaud's disease without gangrene 05/28/2016  . Bilateral foot pain 03/27/2016  . Trigger index finger of right hand 03/27/2016  . Elevated sedimentation rate 02/28/2016  . Positive ANA (antinuclear antibody) 02/27/2016  . Bilateral shoulder pain 02/27/2016  . Status post left foot surgery 06/28/2015  . Bone spur of foot 06/14/2015    Past Medical History:  Diagnosis Date  . Autoimmune disease (HCC)   . Bone spur of foot   . Cataract   . DDD (degenerative disc disease), cervical 01/26/2017    Past Surgical History:  Procedure Laterality Date  . CATARACT EXTRACTION, BILATERAL    . FOOT SURGERY    .  KIDNEY STONE SURGERY  1972  . TUBAL LIGATION  1970    Social History   Tobacco Use  . Smoking status: Former Smoker    Packs/day: 2.00    Years: 15.00    Pack years: 30.00    Types: Cigarettes    Last attempt to quit: 04/08/1980    Years since quitting: 37.6  . Smokeless tobacco: Never Used  Substance Use Topics  . Alcohol use: Yes    Alcohol/week: 0.0 oz    Comment: Occ  . Drug use: No    Family History  Problem Relation Age of Onset  . Diabetes Mother   . Heart disease Mother   . Kidney failure Mother   . Cancer Mother        Uterine Cancer  . Diabetes  Maternal Grandmother   . Kidney failure Maternal Grandmother   . Stroke Sister   . Gout Maternal Grandfather     Allergies  Allergen Reactions  . Lyrica [Pregabalin] Swelling    Pedal edema     Medication list has been reviewed and updated.  Current Outpatient Medications on File Prior to Visit  Medication Sig Dispense Refill  . acetaminophen (TYLENOL 8 HOUR ARTHRITIS PAIN) 650 MG CR tablet Take 650 mg by mouth every 8 (eight) hours as needed for pain.    Marland Kitchen azaTHIOprine (IMURAN) 50 MG tablet Take 2 tablets (100 mg total) by mouth daily. 60 tablet 2  . calcium carbonate (CALCIUM 600) 600 MG TABS tablet Take 600 mg by mouth daily.    . Ergocalciferol (VITAMIN D2 PO) Take 2,000 mg by mouth once a week.    . estradiol (ESTRACE VAGINAL) 0.1 MG/GM vaginal cream as needed.     . fluticasone (FLONASE) 50 MCG/ACT nasal spray Place 2 sprays into both nostrils daily. 16 g 2  . hydroxychloroquine (PLAQUENIL) 200 MG tablet Take 300 mg by mouth.    . Multiple Vitamins-Minerals (MULTI ADULT GUMMIES PO) Take 1 tablet by mouth daily.    . naproxen (NAPROSYN) 500 MG tablet Take 1 tablet (500 mg total) by mouth 2 (two) times daily. Limit [place] to 3-5 days 10 tablet 0  . Pyridoxine HCl (VITAMIN B-6) 500 MG tablet Take 500 mg by mouth daily.    . traMADol (ULTRAM) 50 MG tablet Take 1 tablet (50 mg total) by mouth every 8 (eight) hours as needed. 15 tablet 0  . vitamin B-12 (CYANOCOBALAMIN) 1000 MCG tablet Take 1,000 mcg by mouth daily.     No current facility-administered medications on file prior to visit.     Review of Systems:  As per HPI- otherwise negative. Feeling well No fever or chills No CP    Physical Examination: Vitals:   11/05/17 1215  BP: (!) 135/57  Pulse: 95  Temp: 99 F (37.2 C)  SpO2: 100%   Vitals:   11/05/17 1215  Weight: 130 lb 12.8 oz (59.3 kg)  Height: 4\' 11"  (1.499 m)   Body mass index is 26.42 kg/m. Ideal Body Weight: Weight in (lb) to have BMI = 25:  123.5  GEN: WDWN, NAD, Non-toxic, A & O x 3 HEENT: Atraumatic, Normocephalic. Neck supple. No masses, No LAD. Ears and Nose: No external deformity. CV: RRR, No M/G/R. No JVD. No thrill. No extra heart sounds. PULM: no wheezes, mild bilateral basilar crackles,no rhonchi. No retractions. No resp. distress. No accessory muscle use. EXTR: No c/c/e NEURO Normal gait.  PSYCH: Normally interactive. Conversant. Not depressed or anxious appearing.  Calm demeanor.  Assessment and Plan: Mixed connective tissue disease (HCC) - Plan: azaTHIOprine (IMURAN) 50 MG tablet  Fear of flying - Plan: ALPRAZolam (XANAX) 0.25 MG tablet  Colon cancer screening - Plan: Ambulatory referral to Gastroenterology  Screening for osteoporosis - Plan: DG Bone Density  Estrogen deficiency - Plan: DG Bone Density  Refilled her imuran today to tide her over until she sees her rheumatologist Refilled xanax that she uses on rare occasion Referral to GI as she likely needs screening Bone density referral Asked her to inquire with Dr. Nickola MajorHawkes about her routine immunizations- I would like to catch her up   Signed Abbe AmsterdamJessica Okie Jansson, MD

## 2017-11-05 ENCOUNTER — Encounter: Payer: Self-pay | Admitting: Family Medicine

## 2017-11-05 ENCOUNTER — Ambulatory Visit (INDEPENDENT_AMBULATORY_CARE_PROVIDER_SITE_OTHER): Payer: Medicare Other | Admitting: Family Medicine

## 2017-11-05 VITALS — BP 135/57 | HR 95 | Temp 99.0°F | Ht 59.0 in | Wt 130.8 lb

## 2017-11-05 DIAGNOSIS — E2839 Other primary ovarian failure: Secondary | ICD-10-CM

## 2017-11-05 DIAGNOSIS — Z1211 Encounter for screening for malignant neoplasm of colon: Secondary | ICD-10-CM

## 2017-11-05 DIAGNOSIS — Z13820 Encounter for screening for osteoporosis: Secondary | ICD-10-CM | POA: Diagnosis not present

## 2017-11-05 DIAGNOSIS — F40243 Fear of flying: Secondary | ICD-10-CM | POA: Diagnosis not present

## 2017-11-05 DIAGNOSIS — M351 Other overlap syndromes: Secondary | ICD-10-CM | POA: Diagnosis not present

## 2017-11-05 MED ORDER — AZATHIOPRINE 50 MG PO TABS: 100.0000 mg | ORAL_TABLET | Freq: Every day | ORAL | 1 refills | Status: AC

## 2017-11-05 MED ORDER — ALPRAZOLAM 0.25 MG PO TABS: ORAL_TABLET | ORAL | 0 refills | Status: AC

## 2017-11-05 NOTE — Patient Instructions (Addendum)
Good to see you today- take care and we will set you up for a bone density exam and have you see GI I refilled your imuran and your alprazolam to use as needed for flying  Please ask Dr. Nickola MajorHawkes about getting your pneumonia and tetanus vaccines with your current medications.  I would like to update you if ok with her

## 2017-11-06 ENCOUNTER — Other Ambulatory Visit: Payer: Self-pay

## 2017-11-11 ENCOUNTER — Other Ambulatory Visit (HOSPITAL_BASED_OUTPATIENT_CLINIC_OR_DEPARTMENT_OTHER): Payer: Medicare Other

## 2017-11-28 DIAGNOSIS — Z6826 Body mass index (BMI) 26.0-26.9, adult: Secondary | ICD-10-CM | POA: Diagnosis not present

## 2017-11-28 DIAGNOSIS — J84112 Idiopathic pulmonary fibrosis: Secondary | ICD-10-CM | POA: Diagnosis not present

## 2017-11-28 DIAGNOSIS — I7300 Raynaud's syndrome without gangrene: Secondary | ICD-10-CM | POA: Diagnosis not present

## 2017-11-28 DIAGNOSIS — G5602 Carpal tunnel syndrome, left upper limb: Secondary | ICD-10-CM | POA: Diagnosis not present

## 2017-11-28 DIAGNOSIS — M329 Systemic lupus erythematosus, unspecified: Secondary | ICD-10-CM | POA: Diagnosis not present

## 2017-11-28 DIAGNOSIS — M6530 Trigger finger, unspecified finger: Secondary | ICD-10-CM | POA: Diagnosis not present

## 2017-11-28 DIAGNOSIS — M351 Other overlap syndromes: Secondary | ICD-10-CM | POA: Diagnosis not present

## 2017-11-28 DIAGNOSIS — R5383 Other fatigue: Secondary | ICD-10-CM | POA: Diagnosis not present

## 2017-11-28 DIAGNOSIS — E663 Overweight: Secondary | ICD-10-CM | POA: Diagnosis not present

## 2017-11-28 DIAGNOSIS — G5601 Carpal tunnel syndrome, right upper limb: Secondary | ICD-10-CM | POA: Diagnosis not present

## 2017-11-28 DIAGNOSIS — G629 Polyneuropathy, unspecified: Secondary | ICD-10-CM | POA: Diagnosis not present

## 2017-11-28 DIAGNOSIS — M150 Primary generalized (osteo)arthritis: Secondary | ICD-10-CM | POA: Diagnosis not present

## 2017-12-15 ENCOUNTER — Ambulatory Visit (INDEPENDENT_AMBULATORY_CARE_PROVIDER_SITE_OTHER): Payer: Medicare Other | Admitting: Family Medicine

## 2017-12-15 ENCOUNTER — Encounter: Payer: Self-pay | Admitting: Family Medicine

## 2017-12-15 ENCOUNTER — Other Ambulatory Visit: Payer: Self-pay | Admitting: Family Medicine

## 2017-12-15 VITALS — BP 135/75 | HR 94 | Temp 99.1°F | Resp 16 | Ht 59.0 in | Wt 130.4 lb

## 2017-12-15 DIAGNOSIS — R2232 Localized swelling, mass and lump, left upper limb: Secondary | ICD-10-CM

## 2017-12-15 DIAGNOSIS — Z1231 Encounter for screening mammogram for malignant neoplasm of breast: Secondary | ICD-10-CM

## 2017-12-15 MED ORDER — DOXYCYCLINE HYCLATE 100 MG PO TABS: 100.0000 mg | ORAL_TABLET | Freq: Two times a day (BID) | ORAL | 0 refills | Status: DC

## 2017-12-15 MED ORDER — CEFTRIAXONE SODIUM 1 G IJ SOLR
1.0000 g | Freq: Once | INTRAMUSCULAR | Status: AC
  Administered 2017-12-15: 1 g via INTRAMUSCULAR

## 2017-12-15 NOTE — Patient Instructions (Signed)
Take the antibiotic as prescribed  We will schedule an ultrasound and call you with the appointment Call us if the pain worsens

## 2017-12-15 NOTE — Progress Notes (Signed)
Patient ID: Madeline Harper, female    DOB: [birth year]  Age: 67 y.o. MRN: [id number]    Subjective:  Subjective  HPI Kehinde Totzke presents for knot under L arm-- very tender.  She only noticed it a day ago.  No fevers No other symptoms  Review of Systems  Constitutional: Negative for activity change, appetite change, fatigue and unexpected weight change.  Respiratory: Negative for cough and shortness of breath.   Cardiovascular: Negative for chest pain and palpitations.  Skin: Positive for color change.       Mass L axilla  Psychiatric/Behavioral: Negative for behavioral problems and dysphoric mood. The patient is not nervous/anxious.     History Past Medical History:  Diagnosis Date  . Autoimmune disease (HCC)   . Bone spur of foot   . Cataract   . DDD (degenerative disc disease), cervical 01/26/2017    She has a past surgical history that includes Kidney stone surgery (1972); Tubal ligation (1970); Cataract extraction, bilateral; and Foot surgery.   Her family history includes Cancer in her mother; Diabetes in her maternal grandmother and mother; Gout in her maternal grandfather; Heart disease in her mother; Kidney failure in her maternal grandmother and mother; Stroke in her sister.She reports that she quit smoking about 37 years ago. Her smoking use included cigarettes. She has a 30.00 pack-year smoking history. She has never used smokeless tobacco. She reports that she drinks alcohol. She reports that she does not use drugs.  Current Outpatient Medications on File Prior to Visit  Medication Sig Dispense Refill  . acetaminophen (TYLENOL 8 HOUR ARTHRITIS PAIN) 650 MG CR tablet Take 650 mg by mouth every 8 (eight) hours as needed for pain.    Marland Kitchen ALPRAZolam (XANAX) 0.25 MG tablet Take up to bid as needed for flying 10 tablet 0  . azaTHIOprine (IMURAN) 50 MG tablet Take 2 tablets (100 mg total) by mouth daily. 60 tablet 1  . calcium carbonate (CALCIUM 600) 600 MG TABS tablet Take 600  mg by mouth daily.    . Ergocalciferol (VITAMIN D2 PO) Take 2,000 mg by mouth once a week.    . estradiol (ESTRACE VAGINAL) 0.1 MG/GM vaginal cream as needed.     . fluticasone (FLONASE) 50 MCG/ACT nasal spray Place 2 sprays into both nostrils daily. 16 g 2  . hydroxychloroquine (PLAQUENIL) 200 MG tablet Take 300 mg by mouth.    . Multiple Vitamins-Minerals (MULTI ADULT GUMMIES PO) Take 1 tablet by mouth daily.    . naproxen (NAPROSYN) 500 MG tablet Take 1 tablet (500 mg total) by mouth 2 (two) times daily. Limit [place] to 3-5 days 10 tablet 0  . Pyridoxine HCl (VITAMIN B-6) 500 MG tablet Take 500 mg by mouth daily.    . traMADol (ULTRAM) 50 MG tablet Take 1 tablet (50 mg total) by mouth every 8 (eight) hours as needed. 15 tablet 0  . vitamin B-12 (CYANOCOBALAMIN) 1000 MCG tablet Take 1,000 mcg by mouth daily.     No current facility-administered medications on file prior to visit.      Objective:  Objective  Physical Exam  Constitutional: She is oriented to person, place, and time. She appears well-developed and well-nourished.  HENT:  Head: Normocephalic and atraumatic.  Eyes: Conjunctivae and EOM are normal.  Neck: Normal range of motion. Neck supple. No JVD present. Carotid bruit is not present. No thyromegaly present.  Cardiovascular: Normal rate, regular rhythm and normal heart sounds.  No murmur heard. Pulmonary/Chest: Effort normal and breath  sounds normal. No respiratory distress. She has no wheezes. She has no rales. She exhibits no tenderness.  Musculoskeletal: She exhibits tenderness. She exhibits no edema.       Arms: Neurological: She is alert and oriented to person, place, and time.  Psychiatric: She has a normal mood and affect.  Nursing note and vitals reviewed.  BP 135/75 (BP Location: Right Arm, Cuff Size: Normal)   Pulse 94   Temp 99.1 F (37.3 C) (Oral)   Resp 16   Ht 4\' 11"  (1.499 m)   Wt 130 lb 6.4 oz (59.1 kg)   SpO2 94%   BMI 26.34 kg/m  Wt Readings from  Last 3 Encounters:  12/15/17 130 lb 6.4 oz (59.1 kg)  11/05/17 130 lb 12.8 oz (59.3 kg)  06/16/17 131 lb (59.4 kg)     Lab Results  Component Value Date   WBC 2.5 (L) 12/15/2017   HGB 9.6 (L) 12/15/2017   HCT 29.9 (L) 12/15/2017   PLT 198.0 12/15/2017   GLUCOSE 88 03/05/2017   ALT 12 (L) 03/03/2017   AST 22 03/03/2017   NA 138 03/05/2017   K 3.8 03/05/2017   CL 108 03/05/2017   CREATININE 0.89 03/05/2017   BUN 18 03/05/2017   CO2 26 03/05/2017   TSH 0.75 02/28/2016    Dg Tibia/fibula Right  Result Date: 05/26/2017 CLINICAL DATA:  Right lower leg pain for the past month. No injury or trauma. EXAM: RIGHT TIBIA AND FIBULA - 2 VIEW COMPARISON:  None. FINDINGS: There is no evidence of fracture or other focal bone lesions. Soft tissues are unremarkable. IMPRESSION: Negative. Electronically Signed   By: Obie Dredge M.D.   On: 05/26/2017 13:43     Assessment & Plan:  Plan  I am having Jolanda Carel start on doxycycline. I am also having her maintain her Multiple Vitamins-Minerals (MULTI ADULT GUMMIES PO), calcium carbonate, estradiol, acetaminophen, hydroxychloroquine, vitamin B-12, vitamin B-6, Ergocalciferol (VITAMIN D2 PO), naproxen, traMADol, fluticasone, azaTHIOprine, and ALPRAZolam. We administered cefTRIAXone.  Meds ordered this encounter  Medications  . doxycycline (VIBRA-TABS) 100 MG tablet    Sig: Take 1 tablet (100 mg total) by mouth 2 (two) times daily.    Dispense:  20 tablet    Refill:  0  . cefTRIAXone (ROCEPHIN) injection 1 g    Problem List Items Addressed This Visit    None    Visit Diagnoses    Mass of left axilla    -  Primary   Relevant Medications   doxycycline (VIBRA-TABS) 100 MG tablet   cefTRIAXone (ROCEPHIN) injection 1 g (Completed)   Other Relevant Orders   CBC with Differential/Platelet (Completed)   US BREAST LTD UNI LEFT INC AXILLA      Follow-up: Return if symptoms worsen or fail to improve.  Donato Schultz, DO

## 2017-12-16 LAB — CBC WITH DIFFERENTIAL/PLATELET
BASOS ABS: 0 10*3/uL (ref 0.0–0.1)
Basophils Relative: 0.6 % (ref 0.0–3.0)
Eosinophils Absolute: 0 10*3/uL (ref 0.0–0.7)
Eosinophils Relative: 1.4 % (ref 0.0–5.0)
HCT: 29.9 % — ABNORMAL LOW (ref 36.0–46.0)
Hemoglobin: 9.6 g/dL — ABNORMAL LOW (ref 12.0–15.0)
LYMPHS ABS: 0.4 10*3/uL — AB (ref 0.7–4.0)
Lymphocytes Relative: 14.6 % (ref 12.0–46.0)
MCHC: 32.2 g/dL (ref 30.0–36.0)
MCV: 86.4 fl (ref 78.0–100.0)
MONO ABS: 0.3 10*3/uL (ref 0.1–1.0)
Monocytes Relative: 10.5 % (ref 3.0–12.0)
NEUTROS PCT: 72.9 % (ref 43.0–77.0)
Neutro Abs: 1.8 10*3/uL (ref 1.4–7.7)
PLATELETS: 198 10*3/uL (ref 150.0–400.0)
RBC: 3.46 Mil/uL — AB (ref 3.87–5.11)
RDW: 15.1 % (ref 11.5–15.5)
WBC: 2.5 10*3/uL — ABNORMAL LOW (ref 4.0–10.5)

## 2017-12-19 ENCOUNTER — Telehealth: Payer: Self-pay

## 2017-12-19 NOTE — Telephone Encounter (Signed)
Copied from CRM [phone]#[id number]. Topic: Inquiry >> Dec 19, 2017 12:08 PM Windy KalataMichael, Taylor L, NT wrote: Reason for CRM: patient is calling and states she was supposed to gather information on when she had her last colonoscopy which was on 10/02/12. She had that done at [facility].

## 2017-12-22 ENCOUNTER — Ambulatory Visit
Admission: RE | Admit: 2017-12-22 | Discharge: 2017-12-22 | Disposition: A | Discharge status: Home / Self Care | Payer: Medicare Other | Source: Ambulatory Visit | Attending: Family Medicine | Admitting: Family Medicine

## 2017-12-22 ENCOUNTER — Other Ambulatory Visit: Payer: Self-pay | Admitting: Family Medicine

## 2017-12-22 DIAGNOSIS — N6489 Other specified disorders of breast: Secondary | ICD-10-CM | POA: Diagnosis not present

## 2017-12-22 DIAGNOSIS — R922 Inconclusive mammogram: Secondary | ICD-10-CM | POA: Diagnosis not present

## 2017-12-22 DIAGNOSIS — R2231 Localized swelling, mass and lump, right upper limb: Secondary | ICD-10-CM

## 2017-12-22 DIAGNOSIS — R2232 Localized swelling, mass and lump, left upper limb: Secondary | ICD-10-CM

## 2017-12-22 DIAGNOSIS — R599 Enlarged lymph nodes, unspecified: Secondary | ICD-10-CM

## 2017-12-22 NOTE — Telephone Encounter (Signed)
Documented date and sent release form to get report

## 2017-12-25 ENCOUNTER — Encounter: Payer: Self-pay | Admitting: Family Medicine

## 2017-12-25 DIAGNOSIS — Z1211 Encounter for screening for malignant neoplasm of colon: Secondary | ICD-10-CM

## 2017-12-26 ENCOUNTER — Ambulatory Visit
Admission: RE | Admit: 2017-12-26 | Discharge: 2017-12-26 | Disposition: A | Discharge status: Home / Self Care | Payer: Medicare Other | Source: Ambulatory Visit | Attending: Family Medicine | Admitting: Family Medicine

## 2017-12-26 ENCOUNTER — Other Ambulatory Visit (HOSPITAL_COMMUNITY)
Admission: RE | Admit: 2017-12-26 | Discharge: 2017-12-26 | Disposition: A | Discharge status: Home / Self Care | Payer: Medicare Other | Source: Ambulatory Visit | Attending: Body Imaging | Admitting: Body Imaging

## 2017-12-26 DIAGNOSIS — R590 Localized enlarged lymph nodes: Secondary | ICD-10-CM | POA: Diagnosis not present

## 2017-12-26 DIAGNOSIS — R599 Enlarged lymph nodes, unspecified: Secondary | ICD-10-CM

## 2017-12-31 DIAGNOSIS — M329 Systemic lupus erythematosus, unspecified: Secondary | ICD-10-CM | POA: Diagnosis not present

## 2017-12-31 DIAGNOSIS — Z6826 Body mass index (BMI) 26.0-26.9, adult: Secondary | ICD-10-CM | POA: Diagnosis not present

## 2017-12-31 DIAGNOSIS — E663 Overweight: Secondary | ICD-10-CM | POA: Diagnosis not present

## 2017-12-31 DIAGNOSIS — I7300 Raynaud's syndrome without gangrene: Secondary | ICD-10-CM | POA: Diagnosis not present

## 2017-12-31 DIAGNOSIS — R591 Generalized enlarged lymph nodes: Secondary | ICD-10-CM | POA: Diagnosis not present

## 2017-12-31 DIAGNOSIS — Z79899 Other long term (current) drug therapy: Secondary | ICD-10-CM | POA: Diagnosis not present

## 2017-12-31 DIAGNOSIS — M150 Primary generalized (osteo)arthritis: Secondary | ICD-10-CM | POA: Diagnosis not present

## 2017-12-31 DIAGNOSIS — R809 Proteinuria, unspecified: Secondary | ICD-10-CM | POA: Diagnosis not present

## 2017-12-31 DIAGNOSIS — M2550 Pain in unspecified joint: Secondary | ICD-10-CM | POA: Diagnosis not present

## 2017-12-31 DIAGNOSIS — J84112 Idiopathic pulmonary fibrosis: Secondary | ICD-10-CM | POA: Diagnosis not present

## 2018-01-01 ENCOUNTER — Telehealth: Payer: Self-pay | Admitting: *Deleted

## 2018-01-01 NOTE — Telephone Encounter (Signed)
Received Surgical Pathology and Bone Marrow Report results from [facility]; forwarded to provider/SLS 09/26

## 2018-01-06 DIAGNOSIS — H26491 Other secondary cataract, right eye: Secondary | ICD-10-CM | POA: Diagnosis not present

## 2018-01-06 DIAGNOSIS — Z79899 Other long term (current) drug therapy: Secondary | ICD-10-CM | POA: Diagnosis not present

## 2018-01-06 DIAGNOSIS — Z961 Presence of intraocular lens: Secondary | ICD-10-CM | POA: Diagnosis not present

## 2018-01-06 DIAGNOSIS — M329 Systemic lupus erythematosus, unspecified: Secondary | ICD-10-CM | POA: Diagnosis not present

## 2018-01-06 LAB — HM DIABETES EYE EXAM

## 2018-01-07 ENCOUNTER — Ambulatory Visit (INDEPENDENT_AMBULATORY_CARE_PROVIDER_SITE_OTHER): Payer: Medicare Other | Admitting: Internal Medicine

## 2018-01-07 ENCOUNTER — Encounter: Payer: Self-pay | Admitting: Internal Medicine

## 2018-01-07 ENCOUNTER — Ambulatory Visit: Payer: Medicare Other | Admitting: Internal Medicine

## 2018-01-07 VITALS — BP 120/74 | HR 77 | Ht 59.0 in | Wt 131.0 lb

## 2018-01-07 DIAGNOSIS — R0689 Other abnormalities of breathing: Secondary | ICD-10-CM

## 2018-01-07 DIAGNOSIS — R0600 Dyspnea, unspecified: Secondary | ICD-10-CM

## 2018-01-07 DIAGNOSIS — J849 Interstitial pulmonary disease, unspecified: Secondary | ICD-10-CM

## 2018-01-07 DIAGNOSIS — M351 Other overlap syndromes: Secondary | ICD-10-CM

## 2018-01-07 DIAGNOSIS — J84112 Idiopathic pulmonary fibrosis: Secondary | ICD-10-CM

## 2018-01-07 NOTE — Progress Notes (Signed)
HPI  Chief Complaint  Madeline presents with  . Pulmonary Consult    Pt referred by Dr. Corliss Skains for abnormal lung sounds. Pt c/o DOE, prod cough with yellow mucus in morning - resolves throughout the day. Pt denies CP/tightness and f/c/s.      Notes from rheumatologist Dr. D reviewed and summarized Madeline has a constellation of ANA greater than 1:1280, positive double-stranded DNA, positive RNP, positive Ro, low C3 and C4 associated with elevated sedimentation rate of 115, arthritis and raynaud. 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 During visit on 03/27/2016 based on my summarization of the chart crackles was observed in the flowsheet been referred here.  Madeline Harper presents for evaluation today. She tells me that for the last 1 year she's had arthralgia and Raynaud symptoms this then resulted in the mixed connective tissue diagnosis as stated above. She also tells me for the last 1 year after having a cold she's had chronic cough associated with sputum production. It is particularly worse in the daytime but she does cough during the rest of the day. She also tells me that she has mild dyspnea and exertion for climbing stairs. She tells me that she works out regularly at [employer] but she does not feel dyspneic although she demonstrated that she has labored breathing. This all improved with rest. 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 She is also wondering if the doctors of performing unnecessary tests on her.   Results for Madeline Harper, Madeline Harper (MRN [id number]) as of 05/22/2016 12:00  Ref. Range 02/28/2016 09:59  Creatinine Latest Ref Range: 0.50 - 0.99 mg/dL 4.09    She had a chest x-ray 03/27/2016: Shows coarse interstitial markings suggestive of interstitial lung disease  Results for Madeline Harper, Madeline Harper (MRN [id number]) as of 05/22/2016 12:00  Ref. Range 02/28/2016 09:59    Cyclic Citrullin Peptide Ab Latest Units: Units <16  ds DNA Ab Latest Units: IU/mL 14 (H)  C3 Complement Latest Ref Range: 90 - 180 mg/dL 40 (L)  C4 Complement Latest Ref Range: 16 - 47 mg/dL 8 (L)  IgG (Immunoglobin G), Serum Latest Ref Range: 694 - 1,618 mg/dL 9,562 (H)  IgA Latest Ref Range: 81 - 463 mg/dL 130  QMVHQ-4-[id number] Latest Ref Range: 0.2 - 0.3 g/dL 0.3  [id number]-4-[id number] Latest Ref Range: 0.5 - 0.9 g/dL 0.8  Ribonucleic Protein(ENA) Antibody, IgG Latest Ref Range: <1.0 NEG AI  >8.0 POS (H)  SSA (Ro) (ENA) Antibody, IgG Latest Ref Range: <1.0 NEG AI  5.7 POS (H)  SSB (La) (ENA) Antibody, IgG Latest Ref Range: <1.0 NEG AI  <1.0 NEG  Scleroderma (Scl-70) (ENA) Antibody, IgG Latest Ref Range: <1.0 NEG AI  <1.0 NEG     OV 06/26/2016  Chief Complaint  Madeline presents with  . Follow-up    Pt here after CT chest, PFT, and echo. Pt deneis change in breahting since last OV.    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 In the interim she did see Dr. Algis Downs on 05/28/2016 and has been recommended Imuran based on her symptoms, autoimmune profile and presence of UIP on CT chest. The show that she has grade 1 diastolic dysfunction associated borderline elevation of pulmonary artery systolic pressure which could easily be due to grade 1 diastolic dysfunction. 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 Based on pulmonary function testing and is only mild severe   The overwhelming thing for this visit is that she is extremely scared to take Imuran another immune suppressants. She is worried about the cancer risk. We talked about the spiritual distress that she is in. We talked about the pros and cons of these medications. We talked about the risk of disease progression particularly pulmonary fibrosis. We talked about the future of drugs that are less toxic being developed  such as Pirfenidone (Esbriet) and Ofev . We talked about  having to manage her chronic disease not be in denial. After all this she was somewhat reassured we talked about the need to monitor  She does have early morning cough with some mild sputum for which she wants symptom relief   Echocardiogram 05/29/16: Shows ejection fraction of 65% with grade 1 diastoic dysfunction and a PSA systolic pressure off 28 mmHg  IMPRESSION: 1. 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 2. Aortic atherosclerosis.   Electronically Signed   By: Trudie Reed M.D.   On: 05/28/2016 14:41   OV 12/23/2016   Chief Complaint  Madeline presents with  . Follow-up    Pt states that when she goes up the stairs, she becomes SOB and has to sit down. With just walking she states that she does not become SOB. C/o prod. cough in the A.M with yellow phlem.    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  Last visit was in March 2018. Because of the UIP pattern and despite mild interstitial lung disease and ongoing significant joint symptoms I supported the recommendation of the Imuran. Review of the chart from the rheumatologist shows that she was started on Imuran April 2018 at 50 mg per day. Then on 08/20/2016 it was increased to 75 mg per day. Currently she is on 100 mg per day of Imuran Her prednisone was tapered to 5 mg per day by 08/30/2016. In her most recent visit 10/24/2016 with Dr. Algis Downs it appears that she was still having significant pain in her shoulders and spine from her mixed connective tissue disease. Madeline tells me 12/23/2016 after that visit she was then recommended to restart prednisone per Dr D (she was weaned off by then). So, she says she felt frustrated. She was then referred to [facility] sawe Dr Artis Flock 9/.5/18 and plaquenil added at this visit.  Continue on  Immuran. Madeline does not know if at this stage for joint symptoms are well-controlled with the addition of Plaquenil because it is only been 12 days or so. She is a little bit frustrated with the direction of her autoimmune therapy. In discussion with Dr. Algis Downs today over the telephone and it is felt that it is best that her autoimmune diseases is best served at [facility]. In talking to the Madeline she is not doing any physical therapy anymore or doing water aerobics. Her main joint issue appears to be stiffness especially early in the morning particularly the right shoulder  In terms of her  symptoms she stable. The dyspnea is only mild. Her main issues that early morning she's having congestion with mild yellow sputum that is stable for many years. This no associated worsening of shortness of breath. This no change in color of sputum or wheezing. Her pulmonary function test shows that her ILD stable compared to last visit.  OV 05/16/2017  Chief Complaint  Madeline presents with  . Follow-up    breathing when up the stairs SOB, cough mostly in the am occ yellow thick mucus in am. 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  Imuran and prednisone being monitored and prescribed by Dr. Sheppard Penton at [facility]  Overall Madeline is doing well.  But when she saw Dr. Sheppard Penton in April 14, 2017 at [facility] she complained of worsening shortness of breath.  She tells me the same.  This is noticed particularly when climbing stairs.  Otherwise her health is in good shape.  She is exercising regularly.  So she does not think deconditioning is an issue.  King's interstitial lung disease questionnaire shows that his only dyspnea on exertion on the stairs that is worse but otherwise she is doing well.  She is compliant with her Imuran and CellCept.  Walking desaturation test today did not show any desaturations but  she is been tachycardic at baseline.  She is noted to have some amount of diastolic dysfunction.    Walking desaturation test on 05/16/2017 185 feet x 3 laps on ROOM AIR:  did not desaturate. Rest pulse ox was 100%, final pulse ox was 100%. HR response 99/min at rest to 115/min at peak exertion. Madeline Harper  Did nto Desaturate < 88% . Madeline Harper did nto  Desaturated </= 3% points. Madeline Harper yes get tachyardic    OV 06/16/2017  Chief Complaint  Madeline presents with  . Follow-up    PFT done today.  Pt states she has been doing good. States she is still coughing in the mornings with yellow mucus.   FU ILD - autooimune and immune suppressed  DOing well. KBILD better. On immuran. No new issues. Walk test and PFT sable. DLCO some down but ? Effort issue. Recent echo with PASP 30s and gr 1 diast dysfn but she is beter. Walk test stable. REfused flu shot and shingles shot. Having post nasal drip and early AM cough    Walking desaturation test on 06/16/2017 185 feet x 3 laps on ROOM AIR:  did walk on 3 laps moderate pace. Did not desaturate. Rest pulse ox was 100%, final pulse ox was 97%. HR response 79/min at rest to 112/min at peak exertion. Madeline Harper  Did not Desaturate < 88% . Madeline Harper yes did  Desaturated </= 3% points. Madeline Harper yes did get tachyardic    OV 01/07/2018  Subjective:  Madeline ID: Madeline Harper, female , DOB: 08/28/1950 , age 69 y.o. , MRN: [id number] , ADDRESS: [id number]  [place] [address]   01/07/2018 -   Chief Complaint  Madeline presents with  . Follow-up    Pt states she believes her breathing has become a little worse since last visit and also states she is coughing in the mornings to get up phlegm that is gray to yellow in color. Denies any CP.   Constellation of ANA greater than 1:1280, positive double-stranded DNA, positive RNP, positive Ro, low C3 and C4 associated with elevated sedimentation rate of 115, arthritis and  raynaud. 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 Also has Raynaud  Last HRCT FEb 2018  With CTA Nov 2018 - UIP pattern Last PFT March 2019  HPI Madeline Harper 67 y.o. - presents for 6 month followup of ILD. She has  new rheumatologist - Dr Nickola Major now. SHe is managed with immuran and plaquenil which is working well for her . But she tells me last few months increaesd dyspnea on exerton , relevied by rest. Mild to moderate worsening. No worsning cough, wheeze, orthopnea, pnd. She also has associated fatigue.  She tells me the fatigue is also exertional.  She feels believes the fatigue is coming from a connective tissue disease.  She tells me that Dr. Lendon Colonel told her that the connective tissue disease/ " lupus" is currently active.  She will not do prednisone.  Her smoking is in remission.     ROS - per HPI     has a past medical history of Autoimmune disease (HCC), Bone spur of foot, Cataract, and DDD (degenerative disc disease), cervical (01/26/2017).   reports that she quit smoking about 37 years ago. Her smoking use included cigarettes. She has a 30.00 pack-year smoking history. She has never used smokeless tobacco.  Past Surgical History:  Procedure Laterality Date  . CATARACT EXTRACTION, BILATERAL    . FOOT SURGERY    . KIDNEY STONE SURGERY  1972  . TUBAL LIGATION  1970    Allergies  Allergen Reactions  . Lyrica [Pregabalin] Swelling    Pedal edema      There is no immunization history on file for this Madeline.  Family History  Problem Relation Age of Onset  . Diabetes Mother   . Heart disease Mother   . Kidney failure Mother   . Cancer Mother        Uterine Cancer  . Diabetes Maternal Grandmother   . Kidney failure Maternal Grandmother   . Stroke Sister   . Gout Maternal Grandfather   . Breast cancer Neg Hx      Current Outpatient Medications:  .  acetaminophen (TYLENOL 8 HOUR ARTHRITIS PAIN) 650 MG CR tablet, Take 650 mg  by mouth every 8 (eight) hours as needed for pain., Disp: , Rfl:  .  azaTHIOprine (IMURAN) 50 MG tablet, Take 2 tablets (100 mg total) by mouth daily., Disp: 60 tablet, Rfl: 1 .  calcium carbonate (CALCIUM 600) 600 MG TABS tablet, Take 600 mg by mouth daily., Disp: , Rfl:  .  Ergocalciferol (VITAMIN D2 PO), Take 2,000 mg by mouth once a week., Disp: , Rfl:  .  hydroxychloroquine (PLAQUENIL) 200 MG tablet, Take 300 mg by mouth., Disp: , Rfl:  .  Multiple Vitamins-Minerals (MULTI ADULT GUMMIES PO), Take 1 tablet by mouth daily., Disp: , Rfl:  .  naproxen (NAPROSYN) 500 MG tablet, Take 1 tablet (500 mg total) by mouth 2 (two) times daily. Limit [place] to 3-5 days, Disp: 10 tablet, Rfl: 0 .  vitamin B-12 (CYANOCOBALAMIN) 1000 MCG tablet, Take 1,000 mcg by mouth daily., Disp: , Rfl:  .  ALPRAZolam (XANAX) 0.25 MG tablet, Take up to bid as needed for flying (Madeline not taking: Reported on 01/07/2018), Disp: 10 tablet, Rfl: 0 .  Pyridoxine HCl (VITAMIN B-6) 500 MG tablet, Take 500 mg by mouth daily., Disp: , Rfl:       Objective:   Vitals:   01/07/18 1207  BP: 120/74  Pulse: 77  SpO2: 100%  Weight: 131 lb (59.4 kg)  Height: 4\' 11"  (1.499 m)    Estimated body mass index is 26.46 kg/m as calculated from the following:   Height as of this encounter: 4\' 11"  (1.499 m).   Weight as of this encounter: 131 lb (59.4 kg).  @WEIGHTCHANGE @  [employer]  01/07/18 1207  Weight: 131 lb (59.4 kg)     Physical Exam  General Appearance:    Alert, cooperative, no distress, appears stated age - yes , Deconditioned looking - mild , OBESE  - no, Sitting on Wheelchair -  no  Head:    Normocephalic, without obvious abnormality, atraumatic  Eyes:    PERRL, conjunctiva/corneas clear,  Ears:    Normal TM's and external ear canals, both ears  Nose:   Nares normal, septum midline, mucosa normal, no drainage    or sinus tenderness. OXYGEN ON  - no . Madeline is @ ra   Throat:   Lips, mucosa, and tongue normal;  teeth and gums normal. Cyanosis on lips - no  Neck:   Supple, symmetrical, trachea midline, no adenopathy;    thyroid:  no enlargement/tenderness/nodules; no carotid   bruit or JVD  Back:     Symmetric, no curvature, ROM normal, no CVA tenderness  Lungs:     Distress - no , Wheeze no, Barrell Chest - no, Purse lip breathing - no, Crackles -classic Velcro crackles with a craniocaudal gradient consistent with UIP  Chest Wall:    No tenderness or deformity.    Heart:    Regular rate and rhythm, S1 and S2 normal, no rub   or gallop, Murmur - no  Breast Exam:    NOT DONE  Abdomen:     Soft, non-tender, bowel sounds active all four quadrants,    no masses, no organomegaly. Visceral obesity - no  Genitalia:   NOT DONE  Rectal:   NOT DONE  Extremities:   Extremities - normal, Has Cane - no, Clubbing - no, Edema - no  Pulses:   2+ and symmetric all extremities  Skin:   Stigmata of Connective Tissue Disease -mildly thickened and stretch skin  Lymph nodes:   Cervical, supraclavicular, and axillary nodes normal  Psychiatric:  Neurologic:   Pleasant - yes, Anxious - no, Flat affect - no  CAm-ICU - neg, Alert and Oriented x 3 - yes, Moves all 4s - yes, Speech - normal, Cognition - intact           Assessment:       ICD-10-CM   1. Dyspnea and respiratory abnormality R06.00 ECHOCARDIOGRAM COMPLETE   R06.89   2. 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 MCTD (mixed connective tissue disease) (HCC) M35.1 Pulmonary function test  4. 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 No lung volume or bd response. No post-bd spiro - next few weeks get  HRCT  - next few weeks - get echo rule out pulmonary hypertension  Followup  - ILD clinic next few weeks but after above  > 50% of this > 25 min visit spent in face to face counseling or coordination of care - by this undersigned MD - Dr Kalman Shan. This includes one or more of the following documented above: discussion of test results, diagnostic or treatment recommendations, prognosis, risks and benefits of management options, instructions, education, compliance or risk-factor reduction    SIGNATURE    Dr. Kalman Shan, M.D.,  F.C.C.P,  Pulmonary and Critical Care Medicine Staff Physician, [facility] Center Director - Interstitial Lung Disease  Program  Pulmonary Fibrosis [facility] at [facility] [place], [place], [address]  Pager: [phone], If no answer or between  15:00h - 7:00h: call [phone] Telephone: [phone]  2:39 PM 01/07/2018

## 2018-01-07 NOTE — Patient Instructions (Addendum)
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 No lung volume or bd response. No post-bd spiro - next few weeks get HRCT  - next few weeks - get echo rule out pulmonary hypertension  Followup  - ILD clinic next few weeks but after above

## 2018-01-14 ENCOUNTER — Ambulatory Visit: Payer: Medicare Other | Admitting: Internal Medicine

## 2018-01-15 ENCOUNTER — Ambulatory Visit (HOSPITAL_BASED_OUTPATIENT_CLINIC_OR_DEPARTMENT_OTHER)
Admission: RE | Admit: 2018-01-15 | Discharge: 2018-01-15 | Disposition: A | Discharge status: Home / Self Care | Payer: Medicare Other | Source: Ambulatory Visit | Attending: Internal Medicine | Admitting: Internal Medicine

## 2018-01-15 DIAGNOSIS — J8410 Pulmonary fibrosis, unspecified: Secondary | ICD-10-CM | POA: Diagnosis not present

## 2018-01-15 DIAGNOSIS — J849 Interstitial pulmonary disease, unspecified: Secondary | ICD-10-CM | POA: Diagnosis not present

## 2018-01-15 DIAGNOSIS — R0600 Dyspnea, unspecified: Secondary | ICD-10-CM

## 2018-01-15 DIAGNOSIS — R0689 Other abnormalities of breathing: Secondary | ICD-10-CM | POA: Diagnosis not present

## 2018-01-15 NOTE — Progress Notes (Signed)
  Echocardiogram 2D Echocardiogram has been performed.  Allanna Bresee T Feliz Lincoln 01/15/2018, 11:28 AM

## 2018-01-16 ENCOUNTER — Telehealth: Payer: Self-pay | Admitting: *Deleted

## 2018-01-16 NOTE — Telephone Encounter (Signed)
Received Eye Exam results from [facility]; forwarded to provider/SLS 10/11

## 2018-01-20 ENCOUNTER — Telehealth: Payer: Self-pay | Admitting: Internal Medicine

## 2018-01-20 DIAGNOSIS — Z959 Presence of cardiac and vascular implant and graft, unspecified: Secondary | ICD-10-CM

## 2018-01-20 DIAGNOSIS — R0789 Other chest pain: Secondary | ICD-10-CM

## 2018-01-20 NOTE — Telephone Encounter (Signed)
Attempted to contact pt. I did not receive an answer. There was no option for me to leave a message. Will try back.  

## 2018-01-21 NOTE — Telephone Encounter (Signed)
Attempted to call pt. I did not receive an answer. I have left a message for pt to return our call.  

## 2018-01-21 NOTE — Telephone Encounter (Signed)
Per last OV she is supposed to come in to dicuss results given fact she is complicated medical problems of ILD/slceroderma. So, please give appt to see me ideally ILD clinic next few weeks.   rsults - PFT shows some decline. BUT HRCT shows stable ILD. ECHO suggests increased right heart pressure  Plan -  refer Dr Marca Ancona for right heart cath - or her primary cardiologist - if so need name - return to see me next few weeks ILD clinic   THanks    SIGNATURE    Dr. Kalman Shan, M.D., F.C.C.P,  Pulmonary and Critical Care Medicine Staff Physician, [facility] Center Director - Interstitial Lung Disease  Program  Pulmonary Fibrosis [facility] Network at [facility] [place], [place], [address]  Pager: [phone], If no answer or between  15:00h - 7:00h: call [phone] Telephone: [phone]  9:08 AM 01/21/2018

## 2018-01-21 NOTE — Telephone Encounter (Signed)
Called and spoke to patient, requesting results of CT and Echo, made aware we are waiting for MR to review. Voiced understanding.  MR please advise on CT and echo results.

## 2018-01-22 NOTE — Telephone Encounter (Signed)
LMTCB

## 2018-01-23 DIAGNOSIS — R809 Proteinuria, unspecified: Secondary | ICD-10-CM | POA: Diagnosis not present

## 2018-01-23 DIAGNOSIS — D631 Anemia in chronic kidney disease: Secondary | ICD-10-CM | POA: Diagnosis not present

## 2018-01-23 DIAGNOSIS — N182 Chronic kidney disease, stage 2 (mild): Secondary | ICD-10-CM | POA: Diagnosis not present

## 2018-01-23 DIAGNOSIS — M329 Systemic lupus erythematosus, unspecified: Secondary | ICD-10-CM | POA: Diagnosis not present

## 2018-01-23 DIAGNOSIS — N183 Chronic kidney disease, stage 3 (moderate): Secondary | ICD-10-CM | POA: Diagnosis not present

## 2018-01-23 NOTE — Telephone Encounter (Signed)
Several findings - can all be confusing over phone   1. 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 Her pulmonary pressures are high inside her chest -> please make appt with Dr Marca Ancona for right heart cath  3. Slightly enlarged lymphgland in her arm pit - will address at followup  Thanks    SIGNATURE    Dr. Kalman Shan, M.D., F.C.C.P,  Pulmonary and Critical Care Medicine Staff Physician, [facility] Center Director - Interstitial Lung Disease  Program  Pulmonary Fibrosis [facility] at [facility] [place], [place], [address]  Pager: [phone], If no answer or between  15:00h - 7:00h: call [phone] Telephone: [phone]  2:08 PM 01/23/2018    IMPRESSION: ct  1. 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 Am Rosezetta Schlatter Crit Care Med Vol 198, Iss 5, [phone], Dec 07 2016. 2. Chronic borderline mediastinal and bilateral axillary adenopathy. Difficult to exclude an underlying lymphoproliferative disorder. 3.  Aortic atherosclerosis (ICD10-170.0).   Electronically Signed   By: Leanna Battles M.D.   On: 01/15/2018 13:15   Study Conclusions - echo  - Left ventricle: The cavity size was normal. Systolic function was   normal. The estimated ejection fraction was in the range of 55%   to 60%. Wall motion was normal; there were no regional wall   motion abnormalities. Doppler parameters are consistent with   abnormal left ventricular relaxation (grade 1 diastolic   dysfunction). - Atrial septum: A patent foramen ovale cannot be excluded. - Pulmonary arteries: PA peak pressure: 39 mm Hg (S).  Impressions:  - Normal LVEF   Mild TR   Can not exclude PFO

## 2018-01-23 NOTE — Telephone Encounter (Signed)
Pt did have a follow up appt that was supposed to have been 01/14/18 but pt had cancelled that appt.  Attempted to call pt but unable to reach her. Left message for pt to return call.

## 2018-01-23 NOTE — Telephone Encounter (Signed)
Pt is calling back [phone]

## 2018-01-23 NOTE — Telephone Encounter (Signed)
Spoke with the pt  She is requesting results of ECHO and HRCT  She never had the pre and post so I scheduled this and next ov with MR ILD clinic 03/09/2018  She is wanting some results before then  Please advise thanks

## 2018-01-23 NOTE — Telephone Encounter (Signed)
Called and spoke with patient regarding results.  Informed the patient of results and recommendations today. Placed order for right heart cath with Dr. Marca Ancona For pulmonary pressures are high inside her chest Pt verbalized understanding and denied any questions or concerns at this time.  Nothing further needed.

## 2018-01-29 ENCOUNTER — Other Ambulatory Visit: Payer: Self-pay | Admitting: Nephrology

## 2018-01-29 DIAGNOSIS — R809 Proteinuria, unspecified: Secondary | ICD-10-CM

## 2018-01-29 DIAGNOSIS — M329 Systemic lupus erythematosus, unspecified: Secondary | ICD-10-CM

## 2018-01-29 DIAGNOSIS — N182 Chronic kidney disease, stage 2 (mild): Secondary | ICD-10-CM

## 2018-01-30 ENCOUNTER — Telehealth: Payer: Self-pay | Admitting: Gastroenterology

## 2018-01-30 NOTE — Telephone Encounter (Signed)
Received referral in [facility] for patient to be seen for next colonoscopy. Patients last colon in 2014 with [facility]. Records scanned into Epic under procedures and labs. Please review and advise on scheduling.

## 2018-02-02 ENCOUNTER — Other Ambulatory Visit (HOSPITAL_COMMUNITY): Payer: Self-pay | Admitting: Nephrology

## 2018-02-02 DIAGNOSIS — D631 Anemia in chronic kidney disease: Principal | ICD-10-CM

## 2018-02-02 DIAGNOSIS — N189 Chronic kidney disease, unspecified: Secondary | ICD-10-CM

## 2018-02-02 DIAGNOSIS — M329 Systemic lupus erythematosus, unspecified: Secondary | ICD-10-CM

## 2018-02-04 ENCOUNTER — Ambulatory Visit
Admission: RE | Admit: 2018-02-04 | Discharge: 2018-02-04 | Disposition: A | Discharge status: Home / Self Care | Payer: Medicare Other | Source: Ambulatory Visit | Attending: Nephrology | Admitting: Nephrology

## 2018-02-04 DIAGNOSIS — N182 Chronic kidney disease, stage 2 (mild): Secondary | ICD-10-CM

## 2018-02-04 DIAGNOSIS — M329 Systemic lupus erythematosus, unspecified: Secondary | ICD-10-CM

## 2018-02-04 DIAGNOSIS — R809 Proteinuria, unspecified: Secondary | ICD-10-CM

## 2018-02-06 ENCOUNTER — Other Ambulatory Visit: Payer: Medicare Other

## 2018-02-08 ENCOUNTER — Encounter (HOSPITAL_BASED_OUTPATIENT_CLINIC_OR_DEPARTMENT_OTHER): Payer: Self-pay | Admitting: *Deleted

## 2018-02-08 ENCOUNTER — Inpatient Hospital Stay (HOSPITAL_BASED_OUTPATIENT_CLINIC_OR_DEPARTMENT_OTHER)
Admission: EM | Admit: 2018-02-08 | Discharge: 2018-03-08 | DRG: 814 | Disposition: E | Payer: Medicare Other | Attending: Pulmonary Disease | Admitting: Pulmonary Disease

## 2018-02-08 ENCOUNTER — Other Ambulatory Visit: Payer: Self-pay

## 2018-02-08 ENCOUNTER — Emergency Department (HOSPITAL_BASED_OUTPATIENT_CLINIC_OR_DEPARTMENT_OTHER): Payer: Medicare Other

## 2018-02-08 DIAGNOSIS — K559 Vascular disorder of intestine, unspecified: Secondary | ICD-10-CM | POA: Diagnosis present

## 2018-02-08 DIAGNOSIS — Z9841 Cataract extraction status, right eye: Secondary | ICD-10-CM

## 2018-02-08 DIAGNOSIS — Z6823 Body mass index (BMI) 23.0-23.9, adult: Secondary | ICD-10-CM

## 2018-02-08 DIAGNOSIS — E871 Hypo-osmolality and hyponatremia: Secondary | ICD-10-CM | POA: Diagnosis not present

## 2018-02-08 DIAGNOSIS — Z01818 Encounter for other preprocedural examination: Secondary | ICD-10-CM

## 2018-02-08 DIAGNOSIS — N170 Acute kidney failure with tubular necrosis: Secondary | ICD-10-CM | POA: Diagnosis not present

## 2018-02-08 DIAGNOSIS — J8489 Other specified interstitial pulmonary diseases: Secondary | ICD-10-CM | POA: Diagnosis present

## 2018-02-08 DIAGNOSIS — D735 Infarction of spleen: Principal | ICD-10-CM | POA: Diagnosis present

## 2018-02-08 DIAGNOSIS — Z841 Family history of disorders of kidney and ureter: Secondary | ICD-10-CM

## 2018-02-08 DIAGNOSIS — E43 Unspecified severe protein-calorie malnutrition: Secondary | ICD-10-CM | POA: Diagnosis not present

## 2018-02-08 DIAGNOSIS — N281 Cyst of kidney, acquired: Secondary | ICD-10-CM | POA: Diagnosis present

## 2018-02-08 DIAGNOSIS — R34 Anuria and oliguria: Secondary | ICD-10-CM | POA: Diagnosis not present

## 2018-02-08 DIAGNOSIS — J9690 Respiratory failure, unspecified, unspecified whether with hypoxia or hypercapnia: Secondary | ICD-10-CM

## 2018-02-08 DIAGNOSIS — R079 Chest pain, unspecified: Secondary | ICD-10-CM | POA: Diagnosis not present

## 2018-02-08 DIAGNOSIS — Z8249 Family history of ischemic heart disease and other diseases of the circulatory system: Secondary | ICD-10-CM

## 2018-02-08 DIAGNOSIS — I748 Embolism and thrombosis of other arteries: Secondary | ICD-10-CM | POA: Diagnosis not present

## 2018-02-08 DIAGNOSIS — R590 Localized enlarged lymph nodes: Secondary | ICD-10-CM | POA: Diagnosis present

## 2018-02-08 DIAGNOSIS — M351 Other overlap syndromes: Secondary | ICD-10-CM | POA: Diagnosis not present

## 2018-02-08 DIAGNOSIS — E8729 Other acidosis: Secondary | ICD-10-CM

## 2018-02-08 DIAGNOSIS — E878 Other disorders of electrolyte and fluid balance, not elsewhere classified: Secondary | ICD-10-CM | POA: Diagnosis not present

## 2018-02-08 DIAGNOSIS — T508X5A Adverse effect of diagnostic agents, initial encounter: Secondary | ICD-10-CM | POA: Diagnosis not present

## 2018-02-08 DIAGNOSIS — R0789 Other chest pain: Secondary | ICD-10-CM | POA: Diagnosis present

## 2018-02-08 DIAGNOSIS — D649 Anemia, unspecified: Secondary | ICD-10-CM | POA: Diagnosis present

## 2018-02-08 DIAGNOSIS — Z833 Family history of diabetes mellitus: Secondary | ICD-10-CM

## 2018-02-08 DIAGNOSIS — J84112 Idiopathic pulmonary fibrosis: Secondary | ICD-10-CM | POA: Diagnosis present

## 2018-02-08 DIAGNOSIS — Z6826 Body mass index (BMI) 26.0-26.9, adult: Secondary | ICD-10-CM

## 2018-02-08 DIAGNOSIS — N183 Chronic kidney disease, stage 3 (moderate): Secondary | ICD-10-CM | POA: Diagnosis present

## 2018-02-08 DIAGNOSIS — I952 Hypotension due to drugs: Secondary | ICD-10-CM | POA: Diagnosis not present

## 2018-02-08 DIAGNOSIS — R6521 Severe sepsis with septic shock: Secondary | ICD-10-CM | POA: Diagnosis not present

## 2018-02-08 DIAGNOSIS — I129 Hypertensive chronic kidney disease with stage 1 through stage 4 chronic kidney disease, or unspecified chronic kidney disease: Secondary | ICD-10-CM | POA: Diagnosis present

## 2018-02-08 DIAGNOSIS — A4150 Gram-negative sepsis, unspecified: Secondary | ICD-10-CM | POA: Diagnosis not present

## 2018-02-08 DIAGNOSIS — A419 Sepsis, unspecified organism: Secondary | ICD-10-CM

## 2018-02-08 DIAGNOSIS — Z9911 Dependence on respirator [ventilator] status: Secondary | ICD-10-CM | POA: Diagnosis not present

## 2018-02-08 DIAGNOSIS — D696 Thrombocytopenia, unspecified: Secondary | ICD-10-CM | POA: Diagnosis not present

## 2018-02-08 DIAGNOSIS — R109 Unspecified abdominal pain: Secondary | ICD-10-CM

## 2018-02-08 DIAGNOSIS — J9601 Acute respiratory failure with hypoxia: Secondary | ICD-10-CM

## 2018-02-08 DIAGNOSIS — R809 Proteinuria, unspecified: Secondary | ICD-10-CM | POA: Diagnosis present

## 2018-02-08 DIAGNOSIS — R0781 Pleurodynia: Secondary | ICD-10-CM

## 2018-02-08 DIAGNOSIS — I776 Arteritis, unspecified: Secondary | ICD-10-CM | POA: Diagnosis present

## 2018-02-08 DIAGNOSIS — Z888 Allergy status to other drugs, medicaments and biological substances status: Secondary | ICD-10-CM

## 2018-02-08 DIAGNOSIS — R220 Localized swelling, mass and lump, head: Secondary | ICD-10-CM

## 2018-02-08 DIAGNOSIS — Z452 Encounter for adjustment and management of vascular access device: Secondary | ICD-10-CM

## 2018-02-08 DIAGNOSIS — Z79899 Other long term (current) drug therapy: Secondary | ICD-10-CM

## 2018-02-08 DIAGNOSIS — E874 Mixed disorder of acid-base balance: Secondary | ICD-10-CM | POA: Diagnosis not present

## 2018-02-08 DIAGNOSIS — R070 Pain in throat: Secondary | ICD-10-CM | POA: Diagnosis not present

## 2018-02-08 DIAGNOSIS — R40243 Glasgow coma scale score 3-8, unspecified time: Secondary | ICD-10-CM

## 2018-02-08 DIAGNOSIS — K7200 Acute and subacute hepatic failure without coma: Secondary | ICD-10-CM | POA: Diagnosis not present

## 2018-02-08 DIAGNOSIS — N179 Acute kidney failure, unspecified: Secondary | ICD-10-CM

## 2018-02-08 DIAGNOSIS — M329 Systemic lupus erythematosus, unspecified: Secondary | ICD-10-CM | POA: Diagnosis present

## 2018-02-08 DIAGNOSIS — Z87891 Personal history of nicotine dependence: Secondary | ICD-10-CM

## 2018-02-08 DIAGNOSIS — E669 Obesity, unspecified: Secondary | ICD-10-CM | POA: Diagnosis present

## 2018-02-08 DIAGNOSIS — D509 Iron deficiency anemia, unspecified: Secondary | ICD-10-CM | POA: Diagnosis present

## 2018-02-08 DIAGNOSIS — Z9842 Cataract extraction status, left eye: Secondary | ICD-10-CM

## 2018-02-08 DIAGNOSIS — E875 Hyperkalemia: Secondary | ICD-10-CM | POA: Diagnosis present

## 2018-02-08 DIAGNOSIS — N141 Nephropathy induced by other drugs, medicaments and biological substances: Secondary | ICD-10-CM | POA: Diagnosis not present

## 2018-02-08 DIAGNOSIS — Z66 Do not resuscitate: Secondary | ICD-10-CM | POA: Diagnosis not present

## 2018-02-08 DIAGNOSIS — E869 Volume depletion, unspecified: Secondary | ICD-10-CM | POA: Diagnosis not present

## 2018-02-08 DIAGNOSIS — E872 Acidosis: Secondary | ICD-10-CM

## 2018-02-08 DIAGNOSIS — J9600 Acute respiratory failure, unspecified whether with hypoxia or hypercapnia: Secondary | ICD-10-CM

## 2018-02-08 DIAGNOSIS — Z515 Encounter for palliative care: Secondary | ICD-10-CM

## 2018-02-08 DIAGNOSIS — K5730 Diverticulosis of large intestine without perforation or abscess without bleeding: Secondary | ICD-10-CM | POA: Diagnosis not present

## 2018-02-08 DIAGNOSIS — T783XXA Angioneurotic edema, initial encounter: Secondary | ICD-10-CM | POA: Diagnosis not present

## 2018-02-08 HISTORY — DX: Acute kidney failure, unspecified: N17.9

## 2018-02-08 HISTORY — DX: Personal history of urinary calculi: Z87.442

## 2018-02-08 HISTORY — DX: Anemia, unspecified: D64.9

## 2018-02-08 HISTORY — DX: Infarction of spleen: D73.5

## 2018-02-08 HISTORY — DX: Proteinuria, unspecified: R80.9

## 2018-02-08 LAB — CBC WITH DIFFERENTIAL/PLATELET
ABS IMMATURE GRANULOCYTES: 0.01 10*3/uL (ref 0.00–0.07)
Basophils Absolute: 0 10*3/uL (ref 0.0–0.1)
Basophils Relative: 0 %
EOS PCT: 1 %
Eosinophils Absolute: 0 10*3/uL (ref 0.0–0.5)
HEMATOCRIT: 31.7 % — AB (ref 36.0–46.0)
HEMOGLOBIN: 9.4 g/dL — AB (ref 12.0–15.0)
Immature Granulocytes: 0 %
LYMPHS ABS: 0.4 10*3/uL — AB (ref 0.7–4.0)
Lymphocytes Relative: 9 %
MCH: 27.5 pg (ref 26.0–34.0)
MCHC: 29.7 g/dL — ABNORMAL LOW (ref 30.0–36.0)
MCV: 92.7 fL (ref 80.0–100.0)
MONOS PCT: 11 %
Monocytes Absolute: 0.4 10*3/uL (ref 0.1–1.0)
NEUTROS ABS: 3 10*3/uL (ref 1.7–7.7)
Neutrophils Relative %: 79 %
Platelets: 159 10*3/uL (ref 150–400)
RBC: 3.42 MIL/uL — ABNORMAL LOW (ref 3.87–5.11)
RDW: 15 % (ref 11.5–15.5)
WBC: 3.8 10*3/uL — ABNORMAL LOW (ref 4.0–10.5)
nRBC: 0 % (ref 0.0–0.2)

## 2018-02-08 LAB — CBC
HCT: 32.4 % — ABNORMAL LOW (ref 36.0–46.0)
Hemoglobin: 9.5 g/dL — ABNORMAL LOW (ref 12.0–15.0)
MCH: 26.6 pg (ref 26.0–34.0)
MCHC: 29.3 g/dL — ABNORMAL LOW (ref 30.0–36.0)
MCV: 90.8 fL (ref 80.0–100.0)
PLATELETS: 156 10*3/uL (ref 150–400)
RBC: 3.57 MIL/uL — ABNORMAL LOW (ref 3.87–5.11)
RDW: 14.9 % (ref 11.5–15.5)
WBC: 4.9 10*3/uL (ref 4.0–10.5)
nRBC: 0 % (ref 0.0–0.2)

## 2018-02-08 LAB — COMPREHENSIVE METABOLIC PANEL
ALK PHOS: 56 U/L (ref 38–126)
ALT: 11 U/L (ref 0–44)
AST: 23 U/L (ref 15–41)
Albumin: 2.3 g/dL — ABNORMAL LOW (ref 3.5–5.0)
Anion gap: 6 (ref 5–15)
BILIRUBIN TOTAL: 0.3 mg/dL (ref 0.3–1.2)
BUN: 13 mg/dL (ref 8–23)
CALCIUM: 8.4 mg/dL — AB (ref 8.9–10.3)
CO2: 25 mmol/L (ref 22–32)
CREATININE: 1.05 mg/dL — AB (ref 0.44–1.00)
Chloride: 106 mmol/L (ref 98–111)
GFR calc non Af Amer: 54 mL/min — ABNORMAL LOW (ref >60)
GLUCOSE: 100 mg/dL — AB (ref 70–99)
Potassium: 3.9 mmol/L (ref 3.5–5.1)
SODIUM: 137 mmol/L (ref 135–145)
TOTAL PROTEIN: 7.8 g/dL (ref 6.5–8.1)

## 2018-02-08 LAB — HEPARIN LEVEL (UNFRACTIONATED): HEPARIN UNFRACTIONATED: 0.15 [IU]/mL — AB (ref 0.30–0.70)

## 2018-02-08 LAB — URINALYSIS, ROUTINE W REFLEX MICROSCOPIC
Bilirubin Urine: NEGATIVE
GLUCOSE, UA: NEGATIVE mg/dL
Ketones, ur: NEGATIVE mg/dL
Leukocytes, UA: NEGATIVE
NITRITE: NEGATIVE
Protein, ur: >300 mg/dL — AB
pH: 6 (ref 5.0–8.0)

## 2018-02-08 LAB — URINALYSIS, MICROSCOPIC (REFLEX)

## 2018-02-08 LAB — TROPONIN I: Troponin I: <0.03 ng/mL (ref <0.03)

## 2018-02-08 LAB — LIPASE, BLOOD: Lipase: 27 U/L (ref 11–51)

## 2018-02-08 LAB — ANTITHROMBIN III: ANTITHROMB III FUNC: 75 % (ref 75–120)

## 2018-02-08 MED ORDER — POLYETHYLENE GLYCOL 3350 17 G PO PACK
17.0000 g | PACK | Freq: Every day | ORAL | Status: DC
  Administered 2018-02-11 – 2018-02-13 (×3): 17 g via ORAL
  Filled 2018-02-08 (×9): qty 1

## 2018-02-08 MED ORDER — ONDANSETRON HCL 4 MG/2ML IJ SOLN
4.0000 mg | Freq: Four times a day (QID) | INTRAMUSCULAR | Status: DC | PRN
  Administered 2018-02-08 – 2018-02-17 (×6): 4 mg via INTRAVENOUS
  Filled 2018-02-08 (×7): qty 2

## 2018-02-08 MED ORDER — FENTANYL CITRATE (PF) 100 MCG/2ML IJ SOLN
25.0000 ug | Freq: Once | INTRAMUSCULAR | Status: AC
  Administered 2018-02-08: 25 ug via INTRAVENOUS

## 2018-02-08 MED ORDER — HYDROXYCHLOROQUINE SULFATE 200 MG PO TABS
300.0000 mg | ORAL_TABLET | Freq: Every day | ORAL | Status: DC
  Administered 2018-02-08 – 2018-02-16 (×9): 300 mg via ORAL
  Filled 2018-02-08 (×2): qty 2
  Filled 2018-02-08: qty 1.5
  Filled 2018-02-08 (×3): qty 2
  Filled 2018-02-08: qty 1.5
  Filled 2018-02-08 (×2): qty 2

## 2018-02-08 MED ORDER — HEPARIN BOLUS VIA INFUSION
3000.0000 [IU] | Freq: Once | INTRAVENOUS | Status: AC
  Administered 2018-02-08: 3000 [IU] via INTRAVENOUS

## 2018-02-08 MED ORDER — MORPHINE SULFATE (PF) 4 MG/ML IV SOLN: 4.0000 mg | Freq: Once | INTRAVENOUS | Status: DC

## 2018-02-08 MED ORDER — FENTANYL CITRATE (PF) 100 MCG/2ML IJ SOLN
50.0000 ug | Freq: Once | INTRAMUSCULAR | Status: DC
  Filled 2018-02-08: qty 2

## 2018-02-08 MED ORDER — OXYCODONE HCL 5 MG PO TABS
5.0000 mg | ORAL_TABLET | ORAL | Status: DC | PRN
  Administered 2018-02-09 – 2018-02-17 (×5): 5 mg via ORAL
  Filled 2018-02-08 (×5): qty 1

## 2018-02-08 MED ORDER — AZATHIOPRINE 50 MG PO TABS
100.0000 mg | ORAL_TABLET | Freq: Every day | ORAL | Status: DC
Start: 2018-02-08 — End: 2018-02-16
  Administered 2018-02-08 – 2018-02-16 (×10): 100 mg via ORAL
  Filled 2018-02-08 (×9): qty 2

## 2018-02-08 MED ORDER — ONDANSETRON HCL 4 MG/2ML IJ SOLN: 4.0000 mg | Freq: Four times a day (QID) | INTRAMUSCULAR | Status: DC | PRN

## 2018-02-08 MED ORDER — MORPHINE SULFATE (PF) 2 MG/ML IV SOLN
2.0000 mg | INTRAVENOUS | Status: DC | PRN
  Administered 2018-02-08 – 2018-02-11 (×9): 2 mg via INTRAVENOUS
  Filled 2018-02-08 (×10): qty 1

## 2018-02-08 MED ORDER — BISACODYL 10 MG RE SUPP: 10.0000 mg | Freq: Every day | RECTAL | Status: DC | PRN

## 2018-02-08 MED ORDER — DOCUSATE SODIUM 100 MG PO CAPS
100.0000 mg | ORAL_CAPSULE | Freq: Two times a day (BID) | ORAL | Status: DC
  Administered 2018-02-09 – 2018-02-13 (×10): 100 mg via ORAL
  Filled 2018-02-08 (×15): qty 1

## 2018-02-08 MED ORDER — ACETAMINOPHEN 650 MG RE SUPP: 650.0000 mg | Freq: Four times a day (QID) | RECTAL | Status: DC | PRN

## 2018-02-08 MED ORDER — ONDANSETRON HCL 4 MG/2ML IJ SOLN
4.0000 mg | Freq: Once | INTRAMUSCULAR | Status: AC
  Administered 2018-02-08: 4 mg via INTRAVENOUS
  Filled 2018-02-08: qty 2

## 2018-02-08 MED ORDER — ACETAMINOPHEN 325 MG PO TABS
650.0000 mg | ORAL_TABLET | Freq: Four times a day (QID) | ORAL | Status: DC | PRN
  Administered 2018-02-08 – 2018-02-15 (×12): 650 mg via ORAL
  Filled 2018-02-08 (×12): qty 2

## 2018-02-08 MED ORDER — HEPARIN (PORCINE) IN NACL 100-0.45 UNIT/ML-% IJ SOLN
900.0000 [IU]/h | INTRAMUSCULAR | Status: DC
  Administered 2018-02-08: 900 [IU]/h via INTRAVENOUS
  Filled 2018-02-08: qty 250

## 2018-02-08 MED ORDER — HEPARIN BOLUS VIA INFUSION
1500.0000 [IU] | Freq: Once | INTRAVENOUS | Status: AC
  Administered 2018-02-08: 1500 [IU] via INTRAVENOUS
  Filled 2018-02-08: qty 1500

## 2018-02-08 MED ORDER — SODIUM CHLORIDE 0.9 % IV SOLN
INTRAVENOUS | Status: DC | PRN
  Administered 2018-02-08: 1000 mL via INTRAVENOUS

## 2018-02-08 MED ORDER — ONDANSETRON HCL 4 MG PO TABS: 4.0000 mg | ORAL_TABLET | Freq: Four times a day (QID) | ORAL | Status: DC | PRN

## 2018-02-08 MED ORDER — SODIUM CHLORIDE 0.9 % IV BOLUS (SEPSIS)
1000.0000 mL | Freq: Once | INTRAVENOUS | Status: AC
  Administered 2018-02-08: 1000 mL via INTRAVENOUS

## 2018-02-08 MED ORDER — FENTANYL CITRATE (PF) 100 MCG/2ML IJ SOLN
25.0000 ug | Freq: Once | INTRAMUSCULAR | Status: AC
  Administered 2018-02-08: 25 ug via INTRAVENOUS
  Filled 2018-02-08: qty 2

## 2018-02-08 MED ORDER — HEPARIN (PORCINE) IN NACL 100-0.45 UNIT/ML-% IJ SOLN
1100.0000 [IU]/h | INTRAMUSCULAR | Status: DC
  Administered 2018-02-09 – 2018-02-12 (×4): 1100 [IU]/h via INTRAVENOUS
  Filled 2018-02-08 (×4): qty 250

## 2018-02-08 MED ORDER — SODIUM CHLORIDE 0.9% FLUSH
3.0000 mL | Freq: Two times a day (BID) | INTRAVENOUS | Status: DC
  Administered 2018-02-09 – 2018-02-23 (×24): 3 mL via INTRAVENOUS

## 2018-02-08 MED ORDER — IOPAMIDOL (ISOVUE-370) INJECTION 76%
100.0000 mL | Freq: Once | INTRAVENOUS | Status: AC
  Administered 2018-02-08: 100 mL via INTRAVENOUS

## 2018-02-08 MED ORDER — MORPHINE SULFATE (PF) 4 MG/ML IV SOLN
4.0000 mg | INTRAVENOUS | Status: DC | PRN
  Administered 2018-02-08: 4 mg via INTRAVENOUS
  Filled 2018-02-08: qty 1

## 2018-02-08 MED ORDER — MORPHINE SULFATE (PF) 4 MG/ML IV SOLN
4.0000 mg | Freq: Once | INTRAVENOUS | Status: AC
  Administered 2018-02-08: 4 mg via INTRAVENOUS
  Filled 2018-02-08: qty 1

## 2018-02-08 NOTE — ED Triage Notes (Addendum)
C/o left sided mid abd /chest pain that started yesterday. Pt states pain is worse with cough and inspiration. Denies any injury. Denies sob. No distress. States hx of lupus.

## 2018-02-08 NOTE — Progress Notes (Addendum)
Report received from [place] of [facility]. Pt alert and oriented denies pain at this time.

## 2018-02-08 NOTE — ED Notes (Signed)
Pt medicated for transport to [facility]. Rates pain 3/10. VSS.

## 2018-02-08 NOTE — Progress Notes (Addendum)
Pt ambulates to bathroom and then returns to bed with assist. Pt speaking easily through assessment and then vomits yellow emesis abruptly. Pt sttes abdominal pain returns at 3/10

## 2018-02-08 NOTE — ED Notes (Signed)
[facility] here to transport pt to [facility]. Stable at transfer. VSS.

## 2018-02-08 NOTE — ED Notes (Signed)
Pt waiting for inpt. Bed assignment

## 2018-02-08 NOTE — Plan of Care (Signed)
67 yo F with h/o SLE on immunosuppresives.  Presented with L chest pain, L flank pain, LUQ pain.  Low grade fever at home, took tylenol prior to getting to ED.  Afebrile in ED.  CTA chest and CT abd pelvis.  No PE but has large splenic infarct.  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  But could also be from embolic event.  No IVDU, no murmur, cultures being sent.  EDP starting heparin on her.  Holding off on ABx for now.  Will send to Tele bed obs.

## 2018-02-08 NOTE — ED Notes (Signed)
Attempted to call report. RN will return call.  

## 2018-02-08 NOTE — Progress Notes (Signed)
ANTICOAGULATION CONSULT NOTE - Initial Consult  Pharmacy Consult for  Heparin  Indication: splenic infarct  Allergies  Allergen Reactions  . Lyrica [Pregabalin] Swelling    Pedal edema     Patient Measurements: Height: 4\' 11"  (149.9 cm) Weight: 132 lb (59.9 kg) IBW/kg (Calculated) : 43.2 Heparin Dosing Weight: 55.8 kg  Vital Signs: Temp: 99.5 F (37.5 C) (11/03 1408) Temp Source: Oral (11/03 1408) BP: 148/76 (11/03 1234) Pulse Rate: 119 (11/03 1234)  Labs: Recent Labs    02/12/2018 0416 02/23/2018 1302  HGB 9.4* 9.5*  HCT 31.7* 32.4*  PLT 159 156  HEPARINUNFRC  --  0.15*  CREATININE 1.05*  --   TROPONINI <0.03  --     Estimated Creatinine Clearance: 41.5 mL/min (A) (by C-G formula based on SCr of 1.05 mg/dL (H)).   Medical History: Past Medical History:  Diagnosis Date  . Autoimmune disease (HCC)   . Bone spur of foot   . Cataract   . DDD (degenerative disc disease), cervical 01/26/2017    Medications:  Medications Prior to Admission  Medication Sig Dispense Refill Last Dose  . acetaminophen (TYLENOL 8 HOUR ARTHRITIS PAIN) 650 MG CR tablet Take 650 mg by mouth every 8 (eight) hours as needed for pain.   as needed  . azaTHIOprine (IMURAN) 50 MG tablet Take 2 tablets (100 mg total) by mouth daily. 60 tablet 1 02/07/2018 at Unknown time  . calcium carbonate (CALCIUM 600) 600 MG TABS tablet Take 600 mg by mouth daily.   02/07/2018 at Unknown time  . Ergocalciferol (VITAMIN D2 PO) Take 2,000 mg by mouth daily.    02/07/2018 at Unknown time  . hydroxychloroquine (PLAQUENIL) 200 MG tablet Take 300 mg by mouth daily.    02/07/2018 at Unknown time  . lisinopril (PRINIVIL,ZESTRIL) 5 MG tablet Take 5 mg by mouth daily.  3 02/07/2018 at Unknown time  . Multiple Vitamins-Minerals (MULTI ADULT GUMMIES PO) Take 1 tablet by mouth daily.   02/07/2018 at Unknown time  . Pyridoxine HCl (VITAMIN B-6) 500 MG tablet Take 500 mg by mouth daily.   02/07/2018 at Unknown time  . vitamin B-12  (CYANOCOBALAMIN) 1000 MCG tablet Take 1,000 mcg by mouth daily.   02/07/2018 at Unknown time  . ALPRAZolam (XANAX) 0.25 MG tablet Take up to bid as needed for flying (Patient not taking: Reported on 01/07/2018) 10 tablet 0 Not Taking at Unknown time  . naproxen (NAPROSYN) 500 MG tablet Take 1 tablet (500 mg total) by mouth 2 (two) times daily. Limit [place] to 3-5 days (Patient not taking: Reported on 02/21/2018) 10 tablet 0 Not Taking at Unknown time    Assessment: 67 yo with splenic infarct per CT abd on 03/05/2018. Started on IV heparin this morning.   Heparin level 0.15 on heparin 900 units/hr hgb low stable at 9.5, pltc wnl 156k No bleeding reported  Goal of Therapy:  Heparin level 0.3-0.7 units/ml Monitor platelets by anticoagulation protocol: Yes   Plan:  Heparin 1500 units IV bolus x1 Increase heparin drip to 1100 units/hr   check 6  Hr heparin level Daily HL, cbc, monitor for s/sx of bleeding  F/u hypercoag panel labs in process  Noah Delaine, RPh Clinical Pharmacist Please check AMION for all [facility] phone numbers After 10:00 PM, call Main Pharmacy [phone]  02/21/2018,4:26 PM

## 2018-02-08 NOTE — ED Notes (Signed)
Sinus rhythm on monitor. VSS.

## 2018-02-08 NOTE — Progress Notes (Signed)
Lab at [facility] 

## 2018-02-08 NOTE — Progress Notes (Signed)
    [facility] has been requested to perform a transesophageal echocardiogram on this patient for splenic infarct.  After careful review of history and examination, the risks and benefits of transesophageal echocardiogram have been explained including risks of esophageal damage, perforation (1:10,000 risk), bleeding, pharyngeal hematoma as well as other potential complications associated with conscious sedation including aspiration, arrhythmia, respiratory failure and death. Alternatives to treatment were discussed, questions were answered. Patient is willing to proceed. I do not have ability to schedule over the weekend but name was submitted to [employer] to arrange along with other requests that came in this weekend so it is not yet clear if this will be accommodated on schedule tomorrow. Have made patient NPO after midnight and formal orders to follow once patient is officially scheduled.   Laurann Montana, PA-C 02/21/2018 4:47 PM

## 2018-02-08 NOTE — ED Notes (Signed)
To CT

## 2018-02-08 NOTE — Progress Notes (Signed)
Dr. Allena Katz at [facility]

## 2018-02-08 NOTE — ED Notes (Signed)
Pt returned from CT c/o increase in pain level. MD aware and orders received.

## 2018-02-08 NOTE — H&P (Signed)
[facility] History and Physical   Patient: Madeline Harper AVW:[id number]   PCP: Pearline Cables, MD DOB: [birth year]   DOA: 02/12/2018   DOS: 03/01/2018   DOS: the patient was seen and examined on 03/07/2018  Patient coming from: The patient is coming from home.  Chief Complaint: left flank pain  HPI: Madeline Harper is a 67 y.o. female with Past medical history of mixed connective tissue disorder, UIP. Patient presented with complaints of left flank pain that started on Saturday morning.  Pain remained continuous and actually progressively got worse and therefore she decided to come to the hospital.  Pain was worsened with taking deep breath.  It felt like sharp and stabbing.  No nausea no vomiting no diarrhea no constipation.  Prior last week there was no fever chills diarrhea constipation. She was recently started on blood pressure medicine for proteinuria. Denies any family history of learning disorders. Her chart suggest that she is supposed to be on estrogen screen although the patient mentions she does not use it. No smoking, no alcohol abuse. No history of significant bleeding disorder.  ED Course: CT chest abdomen and pelvis with contrast was suggestive of splenic infarct.  No other acute abnormality identified.  Patient was referred for admission after starting on IV heparin.  At her baseline ambulates without any assistance And is independent for most of her ADL; manages her medication on her own.  Review of Systems: as mentioned in the history of present illness.  All other systems reviewed and are negative.  Past Medical History:  Diagnosis Date  . Autoimmune disease (HCC)   . Bone spur of foot   . Cataract   . DDD (degenerative disc disease), cervical 01/26/2017   Past Surgical History:  Procedure Laterality Date  . CATARACT EXTRACTION, BILATERAL    . FOOT SURGERY    . KIDNEY STONE SURGERY  1972  . TUBAL LIGATION  1970   Social History:  reports that she quit  smoking about 37 years ago. Her smoking use included cigarettes. She has a 30.00 pack-year smoking history. She has never used smokeless tobacco. She reports that she drinks alcohol. She reports that she does not use drugs.  Allergies  Allergen Reactions  . Lyrica [Pregabalin] Swelling    Pedal edema    Family History  Problem Relation Age of Onset  . Diabetes Mother   . Heart disease Mother   . Kidney failure Mother   . Cancer Mother        Uterine Cancer  . Diabetes Maternal Grandmother   . Kidney failure Maternal Grandmother   . Stroke Sister   . Gout Maternal Grandfather   . Breast cancer Neg Hx     Prior to Admission medications   Medication Sig Start Date End Date Taking? Authorizing Provider  acetaminophen (TYLENOL 8 HOUR ARTHRITIS PAIN) 650 MG CR tablet Take 650 mg by mouth every 8 (eight) hours as needed for pain.    [provider]  ALPRAZolam Prudy Feeler) 0.25 MG tablet Take up to bid as needed for flying Patient not taking: Reported on 01/07/2018 11/05/17   Copland, Gwenlyn Found, MD  azaTHIOprine (IMURAN) 50 MG tablet Take 2 tablets (100 mg total) by mouth daily. 11/05/17   Copland, Gwenlyn Found, MD  calcium carbonate (CALCIUM 600) 600 MG TABS tablet Take 600 mg by mouth daily.    [provider]  Ergocalciferol (VITAMIN D2 PO) Take 2,000 mg by mouth once a week.    [provider]  hydroxychloroquine (PLAQUENIL) 200 MG tablet Take 300 mg by mouth. 12/11/16   [provider]  Multiple Vitamins-Minerals (MULTI ADULT GUMMIES PO) Take 1 tablet by mouth daily.    [provider]  naproxen (NAPROSYN) 500 MG tablet Take 1 tablet (500 mg total) by mouth 2 (two) times daily. Limit [place] to 3-5 days 05/19/17   Horton, Mayer Masker, MD  Pyridoxine HCl (VITAMIN B-6) 500 MG tablet Take 500 mg by mouth daily.    [provider]  vitamin B-12 (CYANOCOBALAMIN) 1000 MCG tablet Take 1,000 mcg by mouth daily.    [provider]    Physical  Exam: Vitals:   02/22/2018 0800 02/12/2018 0825 02/06/2018 0859 02/27/2018 0948  BP: (!) 187/93  (!) 167/89 (!) 152/82  Pulse:  (!) 110  (!) 113  Resp:  20 18 17   Temp:  98.8 F (37.1 C)  100.1 F (37.8 C)  TempSrc:    Oral  SpO2:  100% 100% 98%  Weight:      Height:        General: Alert, Awake and Oriented to Time, Place and Person. Appear in marked distress, affect appropriate Eyes: PERRL, Conjunctiva normal ENT: Oral Mucosa clear moist. Neck: no JVD, no Abnormal Mass Or lumps Cardiovascular: S1 and S2 Present, no Murmur, Peripheral Pulses Present Respiratory: normal respiratory effort, Bilateral Air entry equal and Decreased, no use of accessory muscle, fine basal Crackles, no wheezes Abdomen: Bowel Sound present, Soft and left sided tenderness, no hernia Skin: no redness, no Rash, no induration Extremities: no Pedal edema, no calf tenderness Neurologic: Grossly no focal neuro deficit. Bilaterally Equal motor strength Labs on Admission:  CBC: Recent Labs  Lab 02/12/2018 0416  WBC 3.8*  NEUTROABS 3.0  HGB 9.4*  HCT 31.7*  MCV 92.7  PLT 159   Basic Metabolic Panel: Recent Labs  Lab 02/16/2018 0416  NA 137  K 3.9  CL 106  CO2 25  GLUCOSE 100*  BUN 13  CREATININE 1.05*  CALCIUM 8.4*   GFR: Estimated Creatinine Clearance: 41.5 mL/min (A) (by C-G formula based on SCr of 1.05 mg/dL (H)). Liver Function Tests: Recent Labs  Lab 02/07/2018 0416  AST 23  ALT 11  ALKPHOS 56  BILITOT 0.3  PROT 7.8  ALBUMIN 2.3*   Recent Labs  Lab 02/27/2018 0416  LIPASE 27   No results for input(s): AMMONIA in the last 168 hours. Coagulation Profile: No results for input(s): INR, PROTIME in the last 168 hours. Cardiac Enzymes: Recent Labs  Lab 02/26/2018 0416  TROPONINI <0.03   BNP (last 3 results) No results for input(s): PROBNP in the last 8760 hours. HbA1C: No results for input(s): HGBA1C in the last 72 hours. CBG: No results for input(s): GLUCAP in the last 168 hours. Lipid  Profile: No results for input(s): CHOL, HDL, LDLCALC, TRIG, CHOLHDL, LDLDIRECT in the last 72 hours. Thyroid Function Tests: No results for input(s): TSH, T4TOTAL, FREET4, T3FREE, THYROIDAB in the last 72 hours. Anemia Panel: No results for input(s): VITAMINB12, FOLATE, FERRITIN, TIBC, IRON, RETICCTPCT in the last 72 hours. Urine analysis:    Component Value Date/Time   COLORURINE YELLOW 03/05/2018 0431   APPEARANCEUR CLEAR 03/03/2018 0431   LABSPEC >1.030 (H) 02/13/2018 0431   PHURINE 6.0 03/01/2018 0431   GLUCOSEU NEGATIVE 02/19/2018 0431   HGBUR MODERATE (A) 02/22/2018 0431   BILIRUBINUR NEGATIVE 03/07/2018 0431   KETONESUR NEGATIVE 03/03/2018 0431   PROTEINUR >300 (A) 02/15/2018 0431   NITRITE NEGATIVE 02/13/2018  0981   LEUKOCYTESUR NEGATIVE 02/07/2018 0431    Radiological Exams on Admission: Ct Angio Chest Pe W And/or Wo Contrast  Result Date: 02/15/2018 CLINICAL DATA:  67 year old female with left-sided chest/abdominal pain. EXAM: CT ANGIOGRAPHY CHEST WITH CONTRAST TECHNIQUE: Multidetector CT imaging of the chest was performed using the standard protocol during bolus administration of intravenous contrast. Multiplanar CT image reconstructions and MIPs were obtained to evaluate the vascular anatomy. CONTRAST:  ISOVUE-370 IOPAMIDOL (ISOVUE-370) INJECTION 76% COMPARISON:  Abdominal CT dated 03/07/2018 and chest CT dated 01/15/2018 FINDINGS: Cardiovascular: There is mild cardiomegaly. No pericardial effusion. Mild atherosclerotic calcification of the aortic arch. No aneurysmal dilatation or dissection. There is no CT evidence of pulmonary embolism. Mediastinum/Nodes: Mildly prominent right hilar lymph node measures 10 mm. Mildly enlarged mediastinal lymph node measures 14 mm anterior to the carina. Left hilar lymph node measure 13 mm. Mildly enlarged bilateral axillary lymph nodes as well as left subpectoral lymph nodes. The esophagus is poorly visualized. No mediastinal fluid  collection. Lungs/Pleura: There is mild eventration of the left hemidiaphragm. Diffuse interstitial coarsening with bibasilar linear scarring and subpleural reticulation and honeycombing consistent with pulmonary fibrosis. Mild lower lobe predominant bronchiectatic changes. No focal consolidation. There is no pleural effusion or pneumothorax. The central airways are patent. Upper Abdomen: See report for accompanying abdominal CT study. Musculoskeletal: Degenerative changes of the spine. No acute osseous pathology. Review of the MIP images confirms the above findings. IMPRESSION: 1. No acute intrathoracic pathology. No CT evidence of pulmonary embolism. 2. Findings of pulmonary fibrosis. 3. Mildly enlarged mediastinal and hilar lymph nodes, nonspecific and similar to prior CT. Clinical correlation is recommended. Electronically Signed   By: Elgie Collard M.D.   On: 03/06/2018 05:26   Ct Abdomen Pelvis W Contrast  Result Date: 02/13/2018 CLINICAL DATA:  67 year old female with left chest/abdominal pain. EXAM: CT ABDOMEN AND PELVIS WITH CONTRAST TECHNIQUE: Multidetector CT imaging of the abdomen and pelvis was performed using the standard protocol following bolus administration of intravenous contrast. CONTRAST:  ISOVUE-370 IOPAMIDOL (ISOVUE-370) INJECTION 76% COMPARISON:  Chest CT dated 02/22/2018. FINDINGS: Lower chest: Please see report for the accompanying chest CT. There is no intra-abdominal free air or free fluid. Hepatobiliary: The liver is unremarkable. No intrahepatic biliary ductal dilatation. The gallbladder is mildly distended otherwise unremarkable. Pancreas: The pancreas is unremarkable. Spleen: Large areas of hypoenhancement predominantly in the periphery and subcapsular region of the spleen extending from the mid to lower pole with irregular areas of hypoenhancement/non enhancement in the superior pole most consistent with subcapsular splenic infarct. Findings may be related to an embolic  process possibly origin ating from the heart such as infective endocarditis. Clinical correlation is recommended. There is mild perisplenic stranding and small amount of fluid. Adrenals/Urinary Tract: The adrenal glands are unremarkable. A 1 cm left renal hypodense lesion is suboptimally characterized but most likely represents a cyst. There is no hydronephrosis on either side. There is symmetric enhancement and excretion of contrast by both kidneys. The visualized ureters appear unremarkable. The urinary bladder is minimally distended. Stomach/Bowel: Small scattered sigmoid diverticula without active inflammatory changes. There is no evidence of bowel obstruction or active inflammation. Normal appendix. Vascular/Lymphatic: Mild aortoiliac atherosclerotic disease. The origins of the celiac axis, SMA, IMA and the renal arteries are patent. Evaluation of the splenic artery is very limited on this study. There is however apparent focal area of wall thickening or plaque or thrombus in the midportion of the splenic artery (series 4, image 19 and coronal  series 7, image 46) with associated luminal narrowing. The SMV, splenic vein, and main portal vein are patent as visualized. No portal venous gas. Mildly enlarged retroperitoneal lymph nodes at the level of the bifurcation of the aorta measure up to 10 mm in short axis. Mildly enlarged right common iliac node measures 10 mm in short axis. Reproductive: The uterus is grossly unremarkable. There is a subcentimeter hypodense lesion in the left ovary with possible fat attenuation which may represent a dermoid. The right ovary is grossly unremarkable. Pelvic ultrasound or MRI may provide better characterization. Other: None Musculoskeletal: No acute or significant osseous findings. IMPRESSION: 1. Large subcapsular splenic infarct. Findings may be related to an embolic process possibly originating from the heart such as infective endocarditis. Clinical correlation is  recommended. 2. Focal area of wall apparent thickening or thrombus in the midportion of the splenic artery with associated luminal narrowing. 3. Mildly enlarged retroperitoneal and right common iliac lymph nodes. 4. Sigmoid diverticulosis. No bowel obstruction or active inflammation. Normal appendix. 5. Possible small left ovarian dermoid. Electronically Signed   By: Elgie Collard M.D.   On: 02/22/2018 05:42   EKG: Independently reviewed. normal sinus rhythm, nonspecific ST and T waves changes.  Assessment/Plan 1. Splenic infarct Presents with left flank pain. CT abdomen shows large area of infarction. Etiology still not clear but likely vasculitis from SLE is more of a possibility. Embolization also cannot be ruled out. Patient denies having any symptoms consistent with infective endocarditis. We will monitor her on telemetry, to rule out A. fib. Recently had an Echocardiogram which was unremarkable for any infective endocarditis or any clotting. Discussed with cardiology, requested TEE to rule out vegetation-cardiac source for embolization. Patient is on IV heparin, for now we will continue with overnight and can likely transition to NOAC tomorrow. N.p.o. after midnight in anticipation for possible TEE, no definite time available. Follow up on blood cultures. No other evidence of acute infection anywhere. We will check lower externally Doppler as well as hypercoagulable panel.  2.  UIP. Mixed connective tissue disorder. Patient is on azathioprine and Plaquenil. We will continue the same.  3.  Proteinuria. Accelerated hypertension-likely secondary to pain. Patient was recently started on blood pressure medication. Continue for now.  4.  Pain control. For now with IV morphine as well as combination OxyIR. Monitor. Initiate bowel regimen.  Nutrition: cardiac diet DVT Prophylaxis:on therapeutic anticoagulation.  Advance goals of care discussion: full code   Consults:  TEE  Family Communication: family was present at bedside, at the time of interview.  Opportunity was given to ask question and all questions were answered satisfactorily.  Disposition: Admitted as observation, telemetry unit. Likely to be discharged hoem, in 2-3 days.  Author: Lynden Oxford, MD [facility] 02/23/2018  If 7PM-7AM, please contact night-coverage [email or web address]

## 2018-02-08 NOTE — ED Notes (Signed)
Report to Paul, RN with [facility]  

## 2018-02-08 NOTE — Progress Notes (Signed)
Chaplain responded to request from patient for [facility] education/information.  Provided brochure and HCPA packet to patient, who was in bed, lying flat; she appeared weak, but awake.  Family was present.  Patient did not want to talk at that moment, requested visit tomorrow.  Chaplain will request f/u by our department. Please call if information or support is needed earlier.   Madeline Harper [phone]    02/19/2018 1500  Clinical Encounter Type  Visited With Patient and family together  Visit Type Follow-up  Referral From Patient  Consult/Referral To Chaplain  Recommendations requests info on [facility]

## 2018-02-08 NOTE — ED Provider Notes (Signed)
TIME SEEN: 3:51 AM  CHIEF COMPLAINT: Left-sided chest pain and abdominal pain  HPI: Patient is a 67 year old female with history of lupus on Imuran and Plaquenil who presents to the emergency department with sudden onset left-sided chest pain and abdominal pain that started last night.  Pain is worse with deep inspiration, coughing, movement and palpation.  No rash.  She did have a low-grade fever of 100 at home and took 2 Tylenol prior to arrival.  States she feels short of breath secondary to this pain.  Describes it as sharp and severe.  No injury that she can recall but she states she thinks she pulled a muscle.  No midline neck or back tenderness.  No no numbness or weakness.  No vomiting or diarrhea.  No dysuria or hematuria.  No history of kidney stones.  States she woke up yesterday feeling very weak all over.  States she recently had a renal ultrasound to evaluate proteinuria.  She is being followed by her primary care physician for this.  ROS: See HPI Constitutional:  fever  Eyes: no drainage  ENT: no runny nose   Cardiovascular:   chest pain  Resp: no SOB  GI: no vomiting GU: no dysuria Integumentary: no rash  Allergy: no hives  Musculoskeletal: no leg swelling  Neurological: no slurred speech ROS otherwise negative  PAST MEDICAL HISTORY/PAST SURGICAL HISTORY:  Past Medical History:  Diagnosis Date  . Autoimmune disease (HCC)   . Bone spur of foot   . Cataract   . DDD (degenerative disc disease), cervical 01/26/2017    MEDICATIONS:  Prior to Admission medications   Medication Sig Start Date End Date Taking? Authorizing Provider  acetaminophen (TYLENOL 8 HOUR ARTHRITIS PAIN) 650 MG CR tablet Take 650 mg by mouth every 8 (eight) hours as needed for pain.    [provider]  ALPRAZolam Prudy Feeler) 0.25 MG tablet Take up to bid as needed for flying Patient not taking: Reported on 01/07/2018 11/05/17   Copland, Gwenlyn Found, MD  azaTHIOprine (IMURAN) 50 MG tablet Take 2  tablets (100 mg total) by mouth daily. 11/05/17   Copland, Gwenlyn Found, MD  calcium carbonate (CALCIUM 600) 600 MG TABS tablet Take 600 mg by mouth daily.    [provider]  Ergocalciferol (VITAMIN D2 PO) Take 2,000 mg by mouth once a week.    [provider]  hydroxychloroquine (PLAQUENIL) 200 MG tablet Take 300 mg by mouth. 12/11/16   [provider]  Multiple Vitamins-Minerals (MULTI ADULT GUMMIES PO) Take 1 tablet by mouth daily.    [provider]  naproxen (NAPROSYN) 500 MG tablet Take 1 tablet (500 mg total) by mouth 2 (two) times daily. Limit [place] to 3-5 days 05/19/17   Horton, Mayer Masker, MD  Pyridoxine HCl (VITAMIN B-6) 500 MG tablet Take 500 mg by mouth daily.    [provider]  vitamin B-12 (CYANOCOBALAMIN) 1000 MCG tablet Take 1,000 mcg by mouth daily.    [provider]    ALLERGIES:  Allergies  Allergen Reactions  . Lyrica [Pregabalin] Swelling    Pedal edema     SOCIAL HISTORY:  Social History   Tobacco Use  . Smoking status: Former Smoker    Packs/day: 2.00    Years: 15.00    Pack years: 30.00    Types: Cigarettes    Last attempt to quit: 04/08/1980    Years since quitting: 37.8  . Smokeless tobacco: Never Used  Substance Use Topics  . Alcohol  use: Yes    Alcohol/week: 0.0 standard drinks    Comment: Occ    FAMILY HISTORY: Family History  Problem Relation Age of Onset  . Diabetes Mother   . Heart disease Mother   . Kidney failure Mother   . Cancer Mother        Uterine Cancer  . Diabetes Maternal Grandmother   . Kidney failure Maternal Grandmother   . Stroke Sister   . Gout Maternal Grandfather   . Breast cancer Neg Hx     EXAM: BP (!) 143/86 (BP Location: Right Arm)   Pulse (!) 103   Temp 98.5 F (36.9 C) (Oral)   Resp 16   Ht 4\' 11"  (1.499 m)   Wt 59.9 kg   SpO2 100%   BMI 26.66 kg/m  CONSTITUTIONAL: Alert and oriented and responds appropriately to questions.  Appears very uncomfortable.   Skin is warm to touch but afebrile. HEAD: Normocephalic EYES: Conjunctivae clear, pupils appear equal, EOMI ENT: normal nose; moist mucous membranes NECK: Supple, no meningismus, no nuchal rigidity, no LAD  CARD: Regular and tachycardic; S1 and S2 appreciated; no murmurs, no clicks, no rubs, no gallops RESP: Normal chest excursion without splinting or tachypnea; breath sounds clear and equal bilaterally; no wheezes, no rhonchi, no rales, no hypoxia or respiratory distress, speaking full sentences ABD/GI: Normal bowel sounds; non-distended; soft, tender in the left upper and lower quadrants, no rebound, no guarding, no peritoneal signs, no hepatosplenomegaly BACK:  The back appears normal and is tender to palpation over the left lateral ribs without ecchymosis, deformity.  No midline spinal tenderness or step-off or deformity. EXT: Normal ROM in all joints; non-tender to palpation; no edema; normal capillary refill; no cyanosis, no calf tenderness or swelling    SKIN: Normal color for age and race; warm; no rash along the left flank noted NEURO: Moves all extremities equally PSYCH: The patient's mood and manner are appropriate. Grooming and personal hygiene are appropriate.  MEDICAL DECISION MAKING: Patient here with left sided chest pain and abdominal pain.  Differential is large including PE especially given her risk factor of lupus, pneumonia, pyelonephritis given she is immunocompromised, musculoskeletal pain, kidney stones, colitis.  Will obtain labs, urine and obtain a CTA of her chest and CT of her abdomen pelvis.  Will give pain and nausea medicine.  Lower suspicion for ACS but will obtain troponin.  EKG shows no new ischemic abnormality.  ED PROGRESS: Patient's labs show leukopenia which is chronic.  Urine shows bacteriuria but no other sign of infection.  She has hematuria and proteinuria which is likely from her lupus.  LFTs, lipase normal.  Troponin negative.  CT chest shows no pulmonary  embolus.  She has signs of pulmonary fibrosis but no pneumonia.  CT of the abdomen pelvis shows large subcapsular splenic infarct that could be related to embolic process.  The pain in her chest is mostly in the left lower lateral side which is likely from her spleen.  No other chest pain.  I do not appreciate a murmur.  We will send blood cultures given she does report fever and is immunocompromised.  She is not an IV drug user.  There is also wall thickening versus thrombus in the splenic artery.  She is at high risk for clotting given her history of lupus.  Will start heparin.  Otherwise bowel appears normal.  Normal appendix.  Will discuss with medicine for admission.  6:16 AM Discussed patient's case with hospitalist, Dr. Julian Reil.  I have recommended admission and patient (and family if present) agree with this plan. Admitting physician will place admission orders.   I have discussed the case with the hospitalist and he agrees with starting heparin and holding IV antibiotics at this time given she is not febrile here, has no leukocytosis, has no murmur and is not an IV drug abuser.  Blood cultures are pending.  I reviewed all nursing notes, vitals, pertinent previous records, EKGs, lab and urine results, imaging (as available).       EKG Interpretation  Date/Time:  Sunday 03/03/2018 04:05:30 EST Ventricular Rate:  92 PR Interval:    QRS Duration: 79 QT Interval:  378 QTC Calculation: 468 R Axis:   8 Text Interpretation:  Sinus rhythm Probable left atrial enlargement Left ventricular hypertrophy No significant change since last tracing Confirmed by Ward, Baxter Hire [phone]) on 03-Mar-2018 4:10:16 AM        CRITICAL CARE Performed by: Baxter Hire Ward   Total critical care time: 60 minutes  Critical care time was exclusive of separately billable procedures and treating other patients.  Critical care was necessary to treat or prevent imminent or life-threatening  deterioration.  Critical care was time spent personally by me on the following activities: development of treatment plan with patient and/or surrogate as well as nursing, discussions with consultants, evaluation of patient's response to treatment, examination of patient, obtaining history from patient or surrogate, ordering and performing treatments and interventions, ordering and review of laboratory studies, ordering and review of radiographic studies, pulse oximetry and re-evaluation of patient's condition.     Ward, Layla Maw, DO 2018/03/03 [phone]

## 2018-02-08 NOTE — ED Notes (Signed)
MD at bedside. Aware no change in 2nd dose of fentanyl. Will medicate per order.

## 2018-02-09 ENCOUNTER — Ambulatory Visit (HOSPITAL_COMMUNITY)
Admission: RE | Admit: 2018-02-09 | Discharge: 2018-02-09 | Disposition: A | Discharge status: Home / Self Care | Payer: Medicare Other | Source: Ambulatory Visit | Attending: Nephrology | Admitting: Nephrology

## 2018-02-09 ENCOUNTER — Encounter: Payer: Self-pay | Admitting: Family Medicine

## 2018-02-09 ENCOUNTER — Observation Stay (HOSPITAL_COMMUNITY): Payer: Medicare Other

## 2018-02-09 ENCOUNTER — Encounter (HOSPITAL_COMMUNITY): Payer: Self-pay

## 2018-02-09 ENCOUNTER — Encounter (HOSPITAL_COMMUNITY): Payer: Self-pay | Admitting: Internal Medicine

## 2018-02-09 DIAGNOSIS — R1010 Upper abdominal pain, unspecified: Secondary | ICD-10-CM | POA: Diagnosis not present

## 2018-02-09 DIAGNOSIS — Z452 Encounter for adjustment and management of vascular access device: Secondary | ICD-10-CM | POA: Diagnosis not present

## 2018-02-09 DIAGNOSIS — R599 Enlarged lymph nodes, unspecified: Secondary | ICD-10-CM | POA: Diagnosis not present

## 2018-02-09 DIAGNOSIS — J9601 Acute respiratory failure with hypoxia: Secondary | ICD-10-CM | POA: Diagnosis not present

## 2018-02-09 DIAGNOSIS — D509 Iron deficiency anemia, unspecified: Secondary | ICD-10-CM | POA: Diagnosis present

## 2018-02-09 DIAGNOSIS — R0789 Other chest pain: Secondary | ICD-10-CM | POA: Diagnosis present

## 2018-02-09 DIAGNOSIS — K559 Vascular disorder of intestine, unspecified: Secondary | ICD-10-CM | POA: Diagnosis not present

## 2018-02-09 DIAGNOSIS — J384 Edema of larynx: Secondary | ICD-10-CM | POA: Diagnosis not present

## 2018-02-09 DIAGNOSIS — R0609 Other forms of dyspnea: Secondary | ICD-10-CM | POA: Diagnosis not present

## 2018-02-09 DIAGNOSIS — R6521 Severe sepsis with septic shock: Secondary | ICD-10-CM | POA: Diagnosis not present

## 2018-02-09 DIAGNOSIS — N183 Chronic kidney disease, stage 3 (moderate): Secondary | ICD-10-CM | POA: Diagnosis present

## 2018-02-09 DIAGNOSIS — N170 Acute kidney failure with tubular necrosis: Secondary | ICD-10-CM | POA: Diagnosis not present

## 2018-02-09 DIAGNOSIS — I776 Arteritis, unspecified: Secondary | ICD-10-CM | POA: Diagnosis present

## 2018-02-09 DIAGNOSIS — I129 Hypertensive chronic kidney disease with stage 1 through stage 4 chronic kidney disease, or unspecified chronic kidney disease: Secondary | ICD-10-CM | POA: Diagnosis present

## 2018-02-09 DIAGNOSIS — G9340 Encephalopathy, unspecified: Secondary | ICD-10-CM | POA: Diagnosis not present

## 2018-02-09 DIAGNOSIS — R809 Proteinuria, unspecified: Secondary | ICD-10-CM

## 2018-02-09 DIAGNOSIS — T783XXD Angioneurotic edema, subsequent encounter: Secondary | ICD-10-CM | POA: Diagnosis not present

## 2018-02-09 DIAGNOSIS — Z79899 Other long term (current) drug therapy: Secondary | ICD-10-CM | POA: Diagnosis not present

## 2018-02-09 DIAGNOSIS — J9690 Respiratory failure, unspecified, unspecified whether with hypoxia or hypercapnia: Secondary | ICD-10-CM | POA: Diagnosis not present

## 2018-02-09 DIAGNOSIS — D735 Infarction of spleen: Secondary | ICD-10-CM | POA: Diagnosis not present

## 2018-02-09 DIAGNOSIS — R918 Other nonspecific abnormal finding of lung field: Secondary | ICD-10-CM | POA: Diagnosis not present

## 2018-02-09 DIAGNOSIS — D500 Iron deficiency anemia secondary to blood loss (chronic): Secondary | ICD-10-CM | POA: Diagnosis not present

## 2018-02-09 DIAGNOSIS — Z9911 Dependence on respirator [ventilator] status: Secondary | ICD-10-CM | POA: Diagnosis not present

## 2018-02-09 DIAGNOSIS — J9600 Acute respiratory failure, unspecified whether with hypoxia or hypercapnia: Secondary | ICD-10-CM | POA: Diagnosis not present

## 2018-02-09 DIAGNOSIS — J988 Other specified respiratory disorders: Secondary | ICD-10-CM | POA: Diagnosis not present

## 2018-02-09 DIAGNOSIS — Z87891 Personal history of nicotine dependence: Secondary | ICD-10-CM | POA: Diagnosis not present

## 2018-02-09 DIAGNOSIS — E872 Acidosis: Secondary | ICD-10-CM | POA: Diagnosis not present

## 2018-02-09 DIAGNOSIS — M351 Other overlap syndromes: Secondary | ICD-10-CM | POA: Diagnosis not present

## 2018-02-09 DIAGNOSIS — R109 Unspecified abdominal pain: Secondary | ICD-10-CM | POA: Diagnosis not present

## 2018-02-09 DIAGNOSIS — D649 Anemia, unspecified: Secondary | ICD-10-CM | POA: Diagnosis not present

## 2018-02-09 DIAGNOSIS — J84112 Idiopathic pulmonary fibrosis: Secondary | ICD-10-CM | POA: Diagnosis present

## 2018-02-09 DIAGNOSIS — E43 Unspecified severe protein-calorie malnutrition: Secondary | ICD-10-CM | POA: Diagnosis not present

## 2018-02-09 DIAGNOSIS — T783XXA Angioneurotic edema, initial encounter: Secondary | ICD-10-CM | POA: Diagnosis not present

## 2018-02-09 DIAGNOSIS — R509 Fever, unspecified: Secondary | ICD-10-CM | POA: Diagnosis not present

## 2018-02-09 DIAGNOSIS — K7200 Acute and subacute hepatic failure without coma: Secondary | ICD-10-CM | POA: Diagnosis not present

## 2018-02-09 DIAGNOSIS — R1030 Lower abdominal pain, unspecified: Secondary | ICD-10-CM | POA: Diagnosis not present

## 2018-02-09 DIAGNOSIS — I9589 Other hypotension: Secondary | ICD-10-CM | POA: Diagnosis not present

## 2018-02-09 DIAGNOSIS — J8489 Other specified interstitial pulmonary diseases: Secondary | ICD-10-CM | POA: Diagnosis present

## 2018-02-09 DIAGNOSIS — N179 Acute kidney failure, unspecified: Secondary | ICD-10-CM | POA: Diagnosis not present

## 2018-02-09 DIAGNOSIS — R590 Localized enlarged lymph nodes: Secondary | ICD-10-CM | POA: Diagnosis present

## 2018-02-09 DIAGNOSIS — Z888 Allergy status to other drugs, medicaments and biological substances status: Secondary | ICD-10-CM | POA: Diagnosis not present

## 2018-02-09 DIAGNOSIS — I952 Hypotension due to drugs: Secondary | ICD-10-CM | POA: Diagnosis not present

## 2018-02-09 DIAGNOSIS — R1110 Vomiting, unspecified: Secondary | ICD-10-CM | POA: Diagnosis not present

## 2018-02-09 DIAGNOSIS — Z66 Do not resuscitate: Secondary | ICD-10-CM | POA: Diagnosis not present

## 2018-02-09 DIAGNOSIS — E871 Hypo-osmolality and hyponatremia: Secondary | ICD-10-CM | POA: Diagnosis not present

## 2018-02-09 DIAGNOSIS — R933 Abnormal findings on diagnostic imaging of other parts of digestive tract: Secondary | ICD-10-CM | POA: Diagnosis not present

## 2018-02-09 DIAGNOSIS — E874 Mixed disorder of acid-base balance: Secondary | ICD-10-CM | POA: Diagnosis not present

## 2018-02-09 DIAGNOSIS — J8410 Pulmonary fibrosis, unspecified: Secondary | ICD-10-CM | POA: Diagnosis not present

## 2018-02-09 DIAGNOSIS — N184 Chronic kidney disease, stage 4 (severe): Secondary | ICD-10-CM | POA: Diagnosis not present

## 2018-02-09 DIAGNOSIS — R0781 Pleurodynia: Secondary | ICD-10-CM | POA: Diagnosis not present

## 2018-02-09 DIAGNOSIS — R1013 Epigastric pain: Secondary | ICD-10-CM | POA: Diagnosis not present

## 2018-02-09 DIAGNOSIS — J849 Interstitial pulmonary disease, unspecified: Secondary | ICD-10-CM | POA: Diagnosis not present

## 2018-02-09 DIAGNOSIS — M329 Systemic lupus erythematosus, unspecified: Secondary | ICD-10-CM | POA: Diagnosis present

## 2018-02-09 DIAGNOSIS — R220 Localized swelling, mass and lump, head: Secondary | ICD-10-CM | POA: Diagnosis not present

## 2018-02-09 DIAGNOSIS — A4150 Gram-negative sepsis, unspecified: Secondary | ICD-10-CM | POA: Diagnosis not present

## 2018-02-09 DIAGNOSIS — E1122 Type 2 diabetes mellitus with diabetic chronic kidney disease: Secondary | ICD-10-CM | POA: Diagnosis not present

## 2018-02-09 DIAGNOSIS — J90 Pleural effusion, not elsewhere classified: Secondary | ICD-10-CM | POA: Diagnosis not present

## 2018-02-09 DIAGNOSIS — Z4682 Encounter for fitting and adjustment of non-vascular catheter: Secondary | ICD-10-CM | POA: Diagnosis not present

## 2018-02-09 HISTORY — DX: Proteinuria, unspecified: R80.9

## 2018-02-09 LAB — COMPREHENSIVE METABOLIC PANEL
ALT: 8 U/L (ref 0–44)
AST: 19 U/L (ref 15–41)
Albumin: 1.6 g/dL — ABNORMAL LOW (ref 3.5–5.0)
Alkaline Phosphatase: 45 U/L (ref 38–126)
Anion gap: 3 — ABNORMAL LOW (ref 5–15)
BUN: 15 mg/dL (ref 8–23)
CHLORIDE: 108 mmol/L (ref 98–111)
CO2: 22 mmol/L (ref 22–32)
Calcium: 7.7 mg/dL — ABNORMAL LOW (ref 8.9–10.3)
Creatinine, Ser: 1.4 mg/dL — ABNORMAL HIGH (ref 0.44–1.00)
GFR calc Af Amer: 44 mL/min — ABNORMAL LOW (ref >60)
GFR, EST NON AFRICAN AMERICAN: 38 mL/min — AB (ref >60)
Glucose, Bld: 88 mg/dL (ref 70–99)
Potassium: 3.8 mmol/L (ref 3.5–5.1)
Sodium: 133 mmol/L — ABNORMAL LOW (ref 135–145)
Total Bilirubin: 0.5 mg/dL (ref 0.3–1.2)
Total Protein: 7 g/dL (ref 6.5–8.1)

## 2018-02-09 LAB — CBC
HEMATOCRIT: 29.5 % — AB (ref 36.0–46.0)
Hemoglobin: 8.6 g/dL — ABNORMAL LOW (ref 12.0–15.0)
MCH: 26.5 pg (ref 26.0–34.0)
MCHC: 29.2 g/dL — AB (ref 30.0–36.0)
MCV: 90.8 fL (ref 80.0–100.0)
Platelets: 159 10*3/uL (ref 150–400)
RBC: 3.25 MIL/uL — ABNORMAL LOW (ref 3.87–5.11)
RDW: 15 % (ref 11.5–15.5)
WBC: 5.4 10*3/uL (ref 4.0–10.5)
nRBC: 0 % (ref 0.0–0.2)

## 2018-02-09 LAB — LACTIC ACID, PLASMA
LACTIC ACID, VENOUS: 1 mmol/L (ref 0.5–1.9)
Lactic Acid, Venous: 1 mmol/L (ref 0.5–1.9)

## 2018-02-09 LAB — HEPARIN LEVEL (UNFRACTIONATED)
HEPARIN UNFRACTIONATED: 0.61 [IU]/mL (ref 0.30–0.70)
Heparin Unfractionated: 0.47 IU/mL (ref 0.30–0.70)

## 2018-02-09 LAB — PROTIME-INR
INR: 1.16
Prothrombin Time: 14.7 seconds (ref 11.4–15.2)

## 2018-02-09 LAB — GLUCOSE, CAPILLARY
GLUCOSE-CAPILLARY: 127 mg/dL — AB (ref 70–99)
Glucose-Capillary: 35 mg/dL — CL (ref 70–99)
Glucose-Capillary: 43 mg/dL — CL (ref 70–99)

## 2018-02-09 LAB — HIV ANTIBODY (ROUTINE TESTING W REFLEX): HIV Screen 4th Generation wRfx: NONREACTIVE

## 2018-02-09 MED ORDER — SODIUM CHLORIDE 0.9 % IV SOLN: INTRAVENOUS | Status: DC | PRN

## 2018-02-09 MED ORDER — SODIUM CHLORIDE 0.9 % IV BOLUS
500.0000 mL | Freq: Once | INTRAVENOUS | Status: AC
  Administered 2018-02-09: 500 mL via INTRAVENOUS

## 2018-02-09 MED ORDER — DEXTROSE 50 % IV SOLN
INTRAVENOUS | Status: AC
  Administered 2018-02-09: 50 mL
  Filled 2018-02-09: qty 50

## 2018-02-09 NOTE — Progress Notes (Signed)
New Admission Note:   Arrival Method: wheelchair Mental Orientation: Alert and oriented  Telemetry:  Assessment: Completed Skin: Intact  IV: Right AC and Right forearm  Pain: 6/10  Tubes: Safety Measures: Safety Fall Prevention Plan has been given, discussed and signed Admission: Completed [facility] Orientation: Patient has been orientated to the room, unit and staff.  Family: Niece  Orders have been reviewed and implemented. Will continue to monitor the patient. Call light has been placed within reach and bed alarm has been activated.   Klee Kolek RN [facility] Phone: [phone]

## 2018-02-09 NOTE — Progress Notes (Signed)
VASCULAR LAB PRELIMINARY  PRELIMINARY  PRELIMINARY  PRELIMINARY  Bilateral lower extremity venous duplexcompleted.    Preliminary report:  There is no DVT or SVT noted in the bilateral lower extremities.   Kj Imbert, RVT 02/09/2018, 9:02 AM

## 2018-02-09 NOTE — Evaluation (Signed)
Physical Therapy Evaluation Patient Details Name: Madeline Harper MRN: [id number] DOB: 1950/10/25 Today's Date: 02/09/2018   History of Present Illness  Pt is a 67 y/o female with a PMH significant for Lupus, cataracts, cervical DDD. She presented with complaints of L flank pain that started saturday morning 11/2, and was found to have a splenic infarct.   Clinical Impression  Pt admitted with above diagnosis. Pt currently with functional limitations due to the deficits listed below (see PT Problem List). At the time of PT eval pt was able to perform transfers and ambulation with gross supervision for safety to min guard assist without an AD. Pt appears unsteady and pt attributes this to medications as well as not eating. Anticipate pt will progress well - no reports of pain throughout session. Acutely, pt will benefit from skilled PT to increase their independence and safety with mobility to allow discharge to the venue listed below.       Follow Up Recommendations No PT follow up;Supervision - Intermittent    Equipment Recommendations  None recommended by PT    Recommendations for Other Services       Precautions / Restrictions Precautions Precautions: Fall Restrictions Weight Bearing Restrictions: No      Mobility  Bed Mobility Overal bed mobility: Modified Independent                Transfers Overall transfer level: Needs assistance Equipment used: None Transfers: Sit to/from Stand Sit to Stand: Supervision         General transfer comment: Pt appears slightly unsteady and states she feels woozy from the medications as well as not eating.   Ambulation/Gait Ambulation/Gait assistance: Min guard Gait Distance (Feet): 120 Feet Assistive device: None Gait Pattern/deviations: Step-through pattern;Decreased stride length;Trunk flexed;Drifts right/left Gait velocity: Decreased Gait velocity interpretation: 1.31 - 2.62 ft/sec, indicative of limited community  ambulator General Gait Details: Noted pt drifting in hallway with occasional unsteadiness. Hands-on guarding provided throughout gait training for safety.   Stairs            Wheelchair Mobility    Modified Rankin (Stroke Patients Only)       Balance Overall balance assessment: Needs assistance Sitting-balance support: Feet supported;No upper extremity supported Sitting balance-Leahy Scale: Fair     Standing balance support: No upper extremity supported;During functional activity Standing balance-Leahy Scale: Poor Standing balance comment: Occasionally unsteady but able to recover without assistance.                              Pertinent Vitals/Pain Pain Assessment: No/denies pain    Home Living Family/patient expects to be discharged to:: Private residence Living Arrangements: Spouse/significant other Available Help at Discharge: Family;Available 24 hours/day(For short term) Type of Home: House Home Access: Stairs to enter   [employer] of Steps: 1 Home Layout: Two level;Bed/bath upstairs;Full bath on main level Home Equipment: None      Prior Function Level of Independence: Independent         Comments: Works as a Administrator, Civil Service for [employer]   Dominant Hand: Right    Extremity/Trunk Assessment   Upper Extremity Assessment Upper Extremity Assessment: Overall WFL for tasks assessed    Lower Extremity Assessment Lower Extremity Assessment: Overall WFL for tasks assessed    Cervical / Trunk Assessment Cervical / Trunk Assessment: Normal  Communication   Communication: No difficulties  Cognition Arousal/Alertness: Awake/alert Behavior During  Therapy: WFL for tasks assessed/performed Overall Cognitive Status: Within Functional Limits for tasks assessed                                        General Comments      Exercises     Assessment/Plan    PT Assessment Patient  needs continued PT services  PT Problem List Decreased strength;Decreased range of motion;Decreased activity tolerance;Decreased balance;Decreased mobility;Decreased knowledge of use of DME;Decreased safety awareness;Decreased knowledge of precautions       PT Treatment Interventions DME instruction;Gait training;Stair training;Functional mobility training;Therapeutic activities;Therapeutic exercise;Neuromuscular re-education;Patient/family education    PT Goals (Current goals can be found in the Care Plan section)  Acute Rehab PT Goals Patient Stated Goal: Get her testing done asap so she can go home.  PT Goal Formulation: With patient Time For Goal Achievement: 02/16/18 Potential to Achieve Goals: Good    Frequency Min 3X/week   Barriers to discharge Decreased caregiver support Family available 24 hours only for a limited time. Husband works during the day so she will be alone after other family leaves.     Co-evaluation               AM-PAC PT "6 Clicks" Daily Activity  Outcome Measure Difficulty turning over in bed (including adjusting bedclothes, sheets and blankets)?: None Difficulty moving from lying on back to sitting on the side of the bed? : None Difficulty sitting down on and standing up from a chair with arms (e.g., wheelchair, bedside commode, etc,.)?: A Little Help needed moving to and from a bed to chair (including a wheelchair)?: A Little Help needed walking in hospital room?: A Little Help needed climbing 3-5 steps with a railing? : A Little 6 Click Score: 20    End of Session Equipment Utilized During Treatment: Gait belt Activity Tolerance: Patient tolerated treatment well Patient left: with call bell/phone within reach;Other (comment)(Sitting EOB set up to wash) Nurse Communication: Mobility status PT Visit Diagnosis: Unsteadiness on feet (R26.81);Difficulty in walking, not elsewhere classified (R26.2)    Time: 1610-9604 PT Time Calculation (min)  (ACUTE ONLY): 19 min   Charges:   PT Evaluation $PT Eval Moderate Complexity: 1 Mod          Conni Slipper, PT, DPT Acute Rehabilitation Services Pager: [phone] Office: [phone]   Marylynn Pearson 02/09/2018, 11:36 AM

## 2018-02-09 NOTE — Progress Notes (Signed)
ANTICOAGULATION CONSULT NOTE - Follow Up Consult  Pharmacy Consult for heparin Indication: splenic infarct  Labs: Recent Labs    02/28/2018 0416 02/13/2018 1302 03/04/2018 2334  HGB 9.4* 9.5*  --   HCT 31.7* 32.4*  --   PLT 159 156  --   HEPARINUNFRC  --  0.15* 0.61  CREATININE 1.05*  --   --   TROPONINI <0.03  --   --     Assessment/Plan:  67yo female therapeutic on heparin after rate change. Will continue gtt at current rate and confirm stable with am labs.   Vernard Gambles, PharmD, BCPS  02/09/2018,12:15 AM

## 2018-02-09 NOTE — Progress Notes (Signed)
ANTICOAGULATION CONSULT NOTE - Follow Up Consult  Pharmacy Consult for Heparin Indication: Splenic infarct  Patient Measurements: Height: 4\' 11"  (149.9 cm) Weight: 132 lb (59.9 kg) IBW/kg (Calculated) : 43.2 Heparin Dosing Weight: 55.8 kg  Vital Signs: Temp: 100.2 F (37.9 C) (11/04 0709) Temp Source: Oral (11/04 0709) BP: 144/67 (11/04 0535) Pulse Rate: 115 (11/04 0535)  Labs: Recent Labs    02/16/2018 0416 03/06/2018 1302 02/09/2018 2334 02/09/18 0556  HGB 9.4* 9.5*  --  8.6*  HCT 31.7* 32.4*  --  29.5*  PLT 159 156  --  159  LABPROT  --   --   --  14.7  INR  --   --   --  1.16  HEPARINUNFRC  --  0.15* 0.61 0.47  CREATININE 1.05*  --   --  1.40*  TROPONINI <0.03  --   --   --     Estimated Creatinine Clearance: 31.1 mL/min (A) (by C-G formula based on SCr of 1.4 mg/dL (H)).   Medications:  Infusions:  . sodium chloride    . heparin 1,100 Units/hr (02/09/18 0343)    Assessment: 11 YOF who presented on 11/3 with L-flank pain and was found to have a splenic infarct - currently evaluating for source. Pharmacy consulted to dose Heparin for anticoagulation.   A Heparin level this morning remains therapeutic (HL 0.47 << 0.61, goal of 0.3-0.7). Hgb/Hct slight drop, plts wnl  Goal of Therapy:  Heparin level 0.3-0.7 units/ml Monitor platelets by anticoagulation protocol: Yes   Plan:  - Continue Heparin at 1100 units/hr (11 ml/hr) - Will continue to monitor for any signs/symptoms of bleeding and will follow up with heparin level in the a.m.   Thank you for allowing pharmacy to be a part of this patient's care.  Georgina Pillion, PharmD, BCPS Clinical Pharmacist Pager: [phone] Clinical phone for 02/09/2018 from 7a-3:30p: [phone] If after 3:30p, please call main pharmacy at: [phone] Please check AMION for all [facility] Pharmacy numbers 02/09/2018 10:19 AM

## 2018-02-09 NOTE — H&P (Signed)
History and Physical    Madeline Harper NWG:[id number] DOB: [birth year] DOA: 02/07/2018  Referring MD/NP/PA:  PCP: Pearline Cables, MD  Patient coming from:   Chief Complaint: home  HPI: Madeline Harper is a 67 y.o. female with medical history significant of  mixed connective tissue disorder, UIP. Patient presented with complaints of left flank pain that started on Saturday morning.  Pain remained continuous and actually progressively got worse and therefore she decided to come to the hospital.  Pain was worsened with taking deep breath.  It felt like sharp and stabbing.  No nausea no vomiting no diarrhea no constipation.  Prior last week there was no fever chills diarrhea constipation. She was recently started on blood pressure medicine for proteinuria. Her chart suggest that she is supposed to be on estrogen screen although the patient mentions she does not use it. No smoking, no alcohol abuse. No history of significant bleeding disorder. ED Course: CT chest abdomen and pelvis with contrast was suggestive of splenic infarct.  No other acute abnormality identified.  Patient was referred for admission after starting on IV heparin.  At her baseline ambulates without any assistance And is independent for most of her ADL; manages her medication on her own.   Review of Systems: As per HPI otherwise 10 point review of systems negative.    Past Medical History:  Diagnosis Date  . Anemia   . Autoimmune disease (HCC)   . Bone spur of foot   . Cataract   . DDD (degenerative disc disease), cervical 01/26/2017  . History of kidney stones   . Proteinuria 02/09/2018   Pt of [place] kidney.  ?lupus nephritis   . Splenic infarct     Past Surgical History:  Procedure Laterality Date  . CATARACT EXTRACTION, BILATERAL    . FOOT SURGERY    . KIDNEY STONE SURGERY  1972  . TUBAL LIGATION  1970     reports that she quit smoking about 37 years ago. Her smoking use included cigarettes. She has a  30.00 pack-year smoking history. She has never used smokeless tobacco. She reports that she drinks alcohol. She reports that she does not use drugs.  Allergies  Allergen Reactions  . Lyrica [Pregabalin] Swelling    Pedal edema     Family History  Problem Relation Age of Onset  . Diabetes Mother   . Heart disease Mother   . Kidney failure Mother   . Cancer Mother        Uterine Cancer  . Diabetes Maternal Grandmother   . Kidney failure Maternal Grandmother   . Stroke Sister   . Gout Maternal Grandfather   . Breast cancer Neg Hx      Prior to Admission medications   Medication Sig Start Date End Date Taking? Authorizing Provider  acetaminophen (TYLENOL 8 HOUR ARTHRITIS PAIN) 650 MG CR tablet Take 650 mg by mouth every 8 (eight) hours as needed for pain.   Yes [provider]  azaTHIOprine (IMURAN) 50 MG tablet Take 2 tablets (100 mg total) by mouth daily. 11/05/17  Yes Copland, Gwenlyn Found, MD  calcium carbonate (CALCIUM 600) 600 MG TABS tablet Take 600 mg by mouth daily.   Yes [provider]  Ergocalciferol (VITAMIN D2 PO) Take 2,000 mg by mouth daily.    Yes [provider]  hydroxychloroquine (PLAQUENIL) 200 MG tablet Take 300 mg by mouth daily.  12/11/16  Yes [provider]  lisinopril (PRINIVIL,ZESTRIL) 5 MG tablet Take 5 mg by  mouth daily. 01/30/18  Yes [provider]  Multiple Vitamins-Minerals (MULTI ADULT GUMMIES PO) Take 1 tablet by mouth daily.   Yes [provider]  Pyridoxine HCl (VITAMIN B-6) 500 MG tablet Take 500 mg by mouth daily.   Yes [provider]  vitamin B-12 (CYANOCOBALAMIN) 1000 MCG tablet Take 1,000 mcg by mouth daily.   Yes [provider]  ALPRAZolam Prudy Feeler) 0.25 MG tablet Take up to bid as needed for flying Patient not taking: Reported on 01/07/2018 11/05/17   Copland, Gwenlyn Found, MD  naproxen (NAPROSYN) 500 MG tablet Take 1 tablet (500 mg total) by mouth 2 (two) times daily. Limit [place] to  3-5 days Patient not taking: Reported on 02-19-18 05/19/17   Shon Baton, MD    Physical Exam: Vitals:   Feb 19, 2018 1826 2018/02/19 2308 02/09/18 0535 02/09/18 0709  BP: 122/63 112/71 (!) 144/67   Pulse: (!) 112 (!) 102 (!) 115   Resp: 17 18 20    Temp: 100 F (37.8 C) 100.1 F (37.8 C) (!) 102.2 F (39 C) 100.2 F (37.9 C)  TempSrc: Oral Oral Oral Oral  SpO2: 98% 96% 93%   Weight:      Height:          Constitutional: NAD, calm, comfortable Vitals:   02-19-2018 1826 19-Feb-2018 2308 02/09/18 0535 02/09/18 0709  BP: 122/63 112/71 (!) 144/67   Pulse: (!) 112 (!) 102 (!) 115   Resp: 17 18 20    Temp: 100 F (37.8 C) 100.1 F (37.8 C) (!) 102.2 F (39 C) 100.2 F (37.9 C)  TempSrc: Oral Oral Oral Oral  SpO2: 98% 96% 93%   Weight:      Height:       Eyes: PERRL, lids and conjunctivae normal ENMT: Mucous membranes are moist. Posterior pharynx clear of any exudate or lesions.Normal dentition.  Neck: normal, supple, no masses, no thyromegaly Respiratory: clear to auscultation bilaterally, no wheezing, no crackles. Normal respiratory effort. No accessory muscle use.  Cardiovascular: Regular rate and rhythm, no murmurs / rubs / gallops. No extremity edema. 2+ pedal pulses. No carotid bruits.  Abdomen: non distended sluggish BS mild diffuse tenderness   Musculoskeletal: no clubbing / cyanosis. No joint deformity upper and lower extremities. Good ROM, no contractures. Normal muscle tone.  Skin: no rashes, lesions, ulcers. No induration Neurologic: CN 2-12 grossly intact. Sensation intact, DTR normal. Strength 5/5 in all 4.  Psychiatric: Normal judgment and insight. Alert and oriented x 3. Normal mood.    Labs on Admission: I have personally reviewed following labs and imaging studies  CBC: Recent Labs  Lab 02/19/2018 0416 February 19, 2018 1302 02/09/18 0556  WBC 3.8* 4.9 5.4  NEUTROABS 3.0  --   --   HGB 9.4* 9.5* 8.6*  HCT 31.7* 32.4* 29.5*  MCV 92.7 90.8 90.8  PLT 159 156 159    Basic Metabolic Panel: Recent Labs  Lab 02-19-18 0416 02/09/18 0556  NA 137 133*  K 3.9 3.8  CL 106 108  CO2 25 22  GLUCOSE 100* 88  BUN 13 15  CREATININE 1.05* 1.40*  CALCIUM 8.4* 7.7*   GFR: Estimated Creatinine Clearance: 31.1 mL/min (A) (by C-G formula based on SCr of 1.4 mg/dL (H)). Liver Function Tests: Recent Labs  Lab 02/19/18 0416 02/09/18 0556  AST 23 19  ALT 11 8  ALKPHOS 56 45  BILITOT 0.3 0.5  PROT 7.8 7.0  ALBUMIN 2.3* 1.6*   Recent Labs  Lab 02-19-2018 0416  LIPASE 27  No results for input(s): AMMONIA in the last 168 hours. Coagulation Profile: Recent Labs  Lab 02/09/18 0556  INR 1.16   Cardiac Enzymes: Recent Labs  Lab 2018/02/28 0416  TROPONINI <0.03   BNP (last 3 results) No results for input(s): PROBNP in the last 8760 hours. HbA1C: No results for input(s): HGBA1C in the last 72 hours. CBG: No results for input(s): GLUCAP in the last 168 hours. Lipid Profile: No results for input(s): CHOL, HDL, LDLCALC, TRIG, CHOLHDL, LDLDIRECT in the last 72 hours. Thyroid Function Tests: No results for input(s): TSH, T4TOTAL, FREET4, T3FREE, THYROIDAB in the last 72 hours. Anemia Panel: No results for input(s): VITAMINB12, FOLATE, FERRITIN, TIBC, IRON, RETICCTPCT in the last 72 hours. Urine analysis:    Component Value Date/Time   COLORURINE YELLOW February 28, 2018 0431   APPEARANCEUR CLEAR 2018/02/28 0431   LABSPEC >1.030 (H) 02-28-18 0431   PHURINE 6.0 02/28/18 0431   GLUCOSEU NEGATIVE 02-28-18 0431   HGBUR MODERATE (A) 28-Feb-2018 0431   BILIRUBINUR NEGATIVE 28-Feb-2018 0431   KETONESUR NEGATIVE 02-28-2018 0431   PROTEINUR >300 (A) February 28, 2018 0431   NITRITE NEGATIVE Feb 28, 2018 0431   LEUKOCYTESUR NEGATIVE 02-28-18 0431   Sepsis Labs: @LABRCNTIP (procalcitonin:4,lacticidven:4) )No results found for this or any previous visit (from the past 240 hour(s)).   Radiological Exams on Admission: Ct Angio Chest Pe W And/or Wo  Contrast  Result Date: 02-28-2018 CLINICAL DATA:  67 year old female with left-sided chest/abdominal pain. EXAM: CT ANGIOGRAPHY CHEST WITH CONTRAST TECHNIQUE: Multidetector CT imaging of the chest was performed using the standard protocol during bolus administration of intravenous contrast. Multiplanar CT image reconstructions and MIPs were obtained to evaluate the vascular anatomy. CONTRAST:  ISOVUE-370 IOPAMIDOL (ISOVUE-370) INJECTION 76% COMPARISON:  Abdominal CT dated 02/28/18 and chest CT dated 01/15/2018 FINDINGS: Cardiovascular: There is mild cardiomegaly. No pericardial effusion. Mild atherosclerotic calcification of the aortic arch. No aneurysmal dilatation or dissection. There is no CT evidence of pulmonary embolism. Mediastinum/Nodes: Mildly prominent right hilar lymph node measures 10 mm. Mildly enlarged mediastinal lymph node measures 14 mm anterior to the carina. Left hilar lymph node measure 13 mm. Mildly enlarged bilateral axillary lymph nodes as well as left subpectoral lymph nodes. The esophagus is poorly visualized. No mediastinal fluid collection. Lungs/Pleura: There is mild eventration of the left hemidiaphragm. Diffuse interstitial coarsening with bibasilar linear scarring and subpleural reticulation and honeycombing consistent with pulmonary fibrosis. Mild lower lobe predominant bronchiectatic changes. No focal consolidation. There is no pleural effusion or pneumothorax. The central airways are patent. Upper Abdomen: See report for accompanying abdominal CT study. Musculoskeletal: Degenerative changes of the spine. No acute osseous pathology. Review of the MIP images confirms the above findings. IMPRESSION: 1. No acute intrathoracic pathology. No CT evidence of pulmonary embolism. 2. Findings of pulmonary fibrosis. 3. Mildly enlarged mediastinal and hilar lymph nodes, nonspecific and similar to prior CT. Clinical correlation is recommended. Electronically Signed   By: Elgie Collard M.D.   On: 02-28-2018 05:26   Ct Abdomen Pelvis W Contrast  Result Date: 2018/02/28 CLINICAL DATA:  67 year old female with left chest/abdominal pain. EXAM: CT ABDOMEN AND PELVIS WITH CONTRAST TECHNIQUE: Multidetector CT imaging of the abdomen and pelvis was performed using the standard protocol following bolus administration of intravenous contrast. CONTRAST:  ISOVUE-370 IOPAMIDOL (ISOVUE-370) INJECTION 76% COMPARISON:  Chest CT dated Feb 28, 2018. FINDINGS: Lower chest: Please see report for the accompanying chest CT. There is no intra-abdominal free air or free fluid. Hepatobiliary: The liver is unremarkable. No intrahepatic biliary ductal dilatation.  The gallbladder is mildly distended otherwise unremarkable. Pancreas: The pancreas is unremarkable. Spleen: Large areas of hypoenhancement predominantly in the periphery and subcapsular region of the spleen extending from the mid to lower pole with irregular areas of hypoenhancement/non enhancement in the superior pole most consistent with subcapsular splenic infarct. Findings may be related to an embolic process possibly origin ating from the heart such as infective endocarditis. Clinical correlation is recommended. There is mild perisplenic stranding and small amount of fluid. Adrenals/Urinary Tract: The adrenal glands are unremarkable. A 1 cm left renal hypodense lesion is suboptimally characterized but most likely represents a cyst. There is no hydronephrosis on either side. There is symmetric enhancement and excretion of contrast by both kidneys. The visualized ureters appear unremarkable. The urinary bladder is minimally distended. Stomach/Bowel: Small scattered sigmoid diverticula without active inflammatory changes. There is no evidence of bowel obstruction or active inflammation. Normal appendix. Vascular/Lymphatic: Mild aortoiliac atherosclerotic disease. The origins of the celiac axis, SMA, IMA and the renal arteries are patent.  Evaluation of the splenic artery is very limited on this study. There is however apparent focal area of wall thickening or plaque or thrombus in the midportion of the splenic artery (series 4, image 19 and coronal series 7, image 46) with associated luminal narrowing. The SMV, splenic vein, and main portal vein are patent as visualized. No portal venous gas. Mildly enlarged retroperitoneal lymph nodes at the level of the bifurcation of the aorta measure up to 10 mm in short axis. Mildly enlarged right common iliac node measures 10 mm in short axis. Reproductive: The uterus is grossly unremarkable. There is a subcentimeter hypodense lesion in the left ovary with possible fat attenuation which may represent a dermoid. The right ovary is grossly unremarkable. Pelvic ultrasound or MRI may provide better characterization. Other: None Musculoskeletal: No acute or significant osseous findings. IMPRESSION: 1. Large subcapsular splenic infarct. Findings may be related to an embolic process possibly originating from the heart such as infective endocarditis. Clinical correlation is recommended. 2. Focal area of wall apparent thickening or thrombus in the midportion of the splenic artery with associated luminal narrowing. 3. Mildly enlarged retroperitoneal and right common iliac lymph nodes. 4. Sigmoid diverticulosis. No bowel obstruction or active inflammation. Normal appendix. 5. Possible small left ovarian dermoid. Electronically Signed   By: Elgie Collard M.D.   On: 02/15/2018 05:42    EKG: Independently reviewed. 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 Splenic infarct Presents with left flank pain. CT abdomen shows large area of  infarction. Etiology not clear but likely vasculitis from SLE is  a possibility. Embolization also cannot be ruled out. Patient denies having any symptoms consistent with infective endocarditis. No events on tele. Recently had an Echocardiogram which was unremarkable for any infective endocarditis or any clotting. Discussed with cardiology, requested TEE to rule out vegetation-cardiac source for embolization. Patient is on IV heparin, for now we will continue. N.p.o. in anticipation of TEE, scheduled for this afternoon. blood cultures with no growth No other evidence of acute infection anywhere. Follow coag panel  2.  UIP. Mixed connective tissue disorder. Patient is on azathioprine and Plaquenil. We will continue the same.  3.  Proteinuria. Accelerated hypertension-likely secondary to pain. Patient was recently started on  blood pressure medication. Continue for now.  4.  Pain control. For now with IV morphine as well as combination OxyIR. Monitor. Initiate bowel regimen.   DVT prophylaxis: heparin Code Status: full Family Communication: friend at bedside Disposition Plan: home in 24-48 hours Consults called: cardiology Admission status: inpateint   Gwenyth Bender NP [facility]   If 7PM-7AM, please contact night-coverage [email or web address] Password [email or web address]  02/09/2018, 1:14 PM

## 2018-02-10 ENCOUNTER — Encounter (HOSPITAL_COMMUNITY): Admission: EM | Disposition: E | Payer: Self-pay | Source: Home / Self Care | Attending: Family Medicine

## 2018-02-10 ENCOUNTER — Ambulatory Visit: Payer: Medicare Other | Admitting: Internal Medicine

## 2018-02-10 ENCOUNTER — Telehealth: Payer: Self-pay

## 2018-02-10 ENCOUNTER — Ambulatory Visit (HOSPITAL_COMMUNITY): Payer: Medicare Other

## 2018-02-10 ENCOUNTER — Inpatient Hospital Stay (HOSPITAL_COMMUNITY): Payer: Medicare Other

## 2018-02-10 ENCOUNTER — Encounter (HOSPITAL_COMMUNITY): Payer: Self-pay | Admitting: Cardiovascular Disease

## 2018-02-10 DIAGNOSIS — Z79899 Other long term (current) drug therapy: Secondary | ICD-10-CM

## 2018-02-10 DIAGNOSIS — J8489 Other specified interstitial pulmonary diseases: Secondary | ICD-10-CM

## 2018-02-10 DIAGNOSIS — R109 Unspecified abdominal pain: Secondary | ICD-10-CM

## 2018-02-10 DIAGNOSIS — R509 Fever, unspecified: Secondary | ICD-10-CM

## 2018-02-10 DIAGNOSIS — Z888 Allergy status to other drugs, medicaments and biological substances status: Secondary | ICD-10-CM

## 2018-02-10 DIAGNOSIS — M351 Other overlap syndromes: Secondary | ICD-10-CM

## 2018-02-10 DIAGNOSIS — D649 Anemia, unspecified: Secondary | ICD-10-CM

## 2018-02-10 DIAGNOSIS — R1110 Vomiting, unspecified: Secondary | ICD-10-CM

## 2018-02-10 DIAGNOSIS — Z87891 Personal history of nicotine dependence: Secondary | ICD-10-CM

## 2018-02-10 DIAGNOSIS — D735 Infarction of spleen: Principal | ICD-10-CM

## 2018-02-10 DIAGNOSIS — R0609 Other forms of dyspnea: Secondary | ICD-10-CM

## 2018-02-10 DIAGNOSIS — R0781 Pleurodynia: Secondary | ICD-10-CM

## 2018-02-10 DIAGNOSIS — R809 Proteinuria, unspecified: Secondary | ICD-10-CM

## 2018-02-10 DIAGNOSIS — R599 Enlarged lymph nodes, unspecified: Secondary | ICD-10-CM

## 2018-02-10 HISTORY — PX: TEE WITHOUT CARDIOVERSION: SHX5443

## 2018-02-10 LAB — FERRITIN: FERRITIN: 50 ng/mL (ref 11–307)

## 2018-02-10 LAB — VITAMIN B12: VITAMIN B 12: 401 pg/mL (ref 180–914)

## 2018-02-10 LAB — RETICULOCYTES
IMMATURE RETIC FRACT: 11.8 % (ref 2.3–15.9)
RBC.: 3.18 MIL/uL — AB (ref 3.87–5.11)
RETIC CT PCT: 0.9 % (ref 0.4–3.1)
Retic Count, Absolute: 28.9 10*3/uL (ref 19.0–186.0)

## 2018-02-10 LAB — BETA-2-GLYCOPROTEIN I ABS, IGG/M/A
Beta-2 Glyco I IgG: <9 GPI IgG units (ref 0–20)
Beta-2-Glycoprotein I IgA: <9 GPI IgA units (ref 0–25)
Beta-2-Glycoprotein I IgM: <9 GPI IgM units (ref 0–32)

## 2018-02-10 LAB — PROTEIN C, TOTAL: PROTEIN C, TOTAL: 104 % (ref 60–150)

## 2018-02-10 LAB — URINALYSIS, ROUTINE W REFLEX MICROSCOPIC
BILIRUBIN URINE: NEGATIVE
Glucose, UA: NEGATIVE mg/dL
KETONES UR: NEGATIVE mg/dL
LEUKOCYTES UA: NEGATIVE
Nitrite: NEGATIVE
PROTEIN: 100 mg/dL — AB
Specific Gravity, Urine: 1.02 (ref 1.005–1.030)
pH: 5 (ref 5.0–8.0)

## 2018-02-10 LAB — PROTEIN S ACTIVITY: PROTEIN S ACTIVITY: 60 % — AB (ref 63–140)

## 2018-02-10 LAB — PROTEIN S, TOTAL: Protein S Ag, Total: 78 % (ref 60–150)

## 2018-02-10 LAB — LUPUS ANTICOAGULANT PANEL
DRVVT: 39.9 s (ref 0.0–47.0)
PTT LA: 44.4 s (ref 0.0–51.9)

## 2018-02-10 LAB — HEMOGLOBIN AND HEMATOCRIT, BLOOD
HEMATOCRIT: 29 % — AB (ref 36.0–46.0)
Hemoglobin: 8.7 g/dL — ABNORMAL LOW (ref 12.0–15.0)

## 2018-02-10 LAB — GLUCOSE, CAPILLARY
GLUCOSE-CAPILLARY: 70 mg/dL (ref 70–99)
GLUCOSE-CAPILLARY: 43 mg/dL — AB (ref 70–99)
GLUCOSE-CAPILLARY: 36 mg/dL — AB (ref 70–99)
GLUCOSE-CAPILLARY: 84 mg/dL (ref 70–99)
Glucose-Capillary: 106 mg/dL — ABNORMAL HIGH (ref 70–99)
Glucose-Capillary: 55 mg/dL — ABNORMAL LOW (ref 70–99)
Glucose-Capillary: 84 mg/dL (ref 70–99)
Glucose-Capillary: 97 mg/dL (ref 70–99)

## 2018-02-10 LAB — TROPONIN I: Troponin I: <0.03 ng/mL (ref <0.03)

## 2018-02-10 LAB — CBC
HEMATOCRIT: 26.2 % — AB (ref 36.0–46.0)
Hemoglobin: 7.6 g/dL — ABNORMAL LOW (ref 12.0–15.0)
MCH: 26.3 pg (ref 26.0–34.0)
MCHC: 29 g/dL — ABNORMAL LOW (ref 30.0–36.0)
MCV: 90.7 fL (ref 80.0–100.0)
NRBC: 0 % (ref 0.0–0.2)
Platelets: 158 10*3/uL (ref 150–400)
RBC: 2.89 MIL/uL — AB (ref 3.87–5.11)
RDW: 14.8 % (ref 11.5–15.5)
WBC: 3.7 10*3/uL — ABNORMAL LOW (ref 4.0–10.5)

## 2018-02-10 LAB — PROCALCITONIN: PROCALCITONIN: 0.27 ng/mL

## 2018-02-10 LAB — HOMOCYSTEINE: Homocysteine: 9.2 umol/L (ref 0.0–15.0)

## 2018-02-10 LAB — CARDIOLIPIN ANTIBODIES, IGG, IGM, IGA
ANTICARDIOLIPIN IGM: 11 [MPL'U]/mL (ref 0–12)
Anticardiolipin IgG: <9 GPL U/mL (ref 0–14)

## 2018-02-10 LAB — IRON AND TIBC
Iron: 10 ug/dL — ABNORMAL LOW (ref 28–170)
Saturation Ratios: 9 % — ABNORMAL LOW (ref 10.4–31.8)
TIBC: 113 ug/dL — AB (ref 250–450)
UIBC: 103 ug/dL

## 2018-02-10 LAB — FOLATE: Folate: 23 ng/mL (ref >5.9)

## 2018-02-10 LAB — C-REACTIVE PROTEIN: CRP: 14 mg/dL — AB (ref <1.0)

## 2018-02-10 LAB — PROTEIN C ACTIVITY: Protein C Activity: 110 % (ref 73–180)

## 2018-02-10 LAB — HEPARIN LEVEL (UNFRACTIONATED): Heparin Unfractionated: 0.38 IU/mL (ref 0.30–0.70)

## 2018-02-10 SURGERY — ECHOCARDIOGRAM, TRANSESOPHAGEAL
Anesthesia: Moderate Sedation

## 2018-02-10 MED ORDER — BUTAMBEN-TETRACAINE-BENZOCAINE 2-2-14 % EX AERO
INHALATION_SPRAY | CUTANEOUS | Status: DC | PRN
  Administered 2018-02-10: 2 via TOPICAL

## 2018-02-10 MED ORDER — DEXTROSE 50 % IV SOLN
INTRAVENOUS | Status: AC
  Administered 2018-02-10: 25 mL
  Filled 2018-02-10: qty 50

## 2018-02-10 MED ORDER — MIDAZOLAM HCL 10 MG/2ML IJ SOLN
INTRAMUSCULAR | Status: DC | PRN
  Administered 2018-02-10: 1 mg via INTRAVENOUS
  Administered 2018-02-10: 2 mg via INTRAVENOUS

## 2018-02-10 MED ORDER — FENTANYL CITRATE (PF) 100 MCG/2ML IJ SOLN
INTRAMUSCULAR | Status: AC
  Filled 2018-02-10: qty 2

## 2018-02-10 MED ORDER — DEXTROSE 50 % IV SOLN
INTRAVENOUS | Status: AC
  Administered 2018-02-10: 50 mL
  Filled 2018-02-10: qty 50

## 2018-02-10 MED ORDER — FENTANYL CITRATE (PF) 100 MCG/2ML IJ SOLN
INTRAMUSCULAR | Status: DC | PRN
  Administered 2018-02-10 (×2): 25 ug via INTRAVENOUS

## 2018-02-10 MED ORDER — DIPHENHYDRAMINE HCL 50 MG/ML IJ SOLN
INTRAMUSCULAR | Status: AC
  Filled 2018-02-10: qty 1

## 2018-02-10 MED ORDER — MIDAZOLAM HCL 5 MG/ML IJ SOLN
INTRAMUSCULAR | Status: AC
  Filled 2018-02-10: qty 2

## 2018-02-10 MED ORDER — SODIUM CHLORIDE 0.9 % IV BOLUS
500.0000 mL | Freq: Once | INTRAVENOUS | Status: AC
  Administered 2018-02-10: 500 mL via INTRAVENOUS

## 2018-02-10 NOTE — Progress Notes (Signed)
ANTICOAGULATION CONSULT NOTE - Follow Up Consult  Pharmacy Consult for Heparin Indication: Splenic infarct  Patient Measurements: Height: 4\' 11"  (149.9 cm) Weight: 132 lb (59.9 kg) IBW/kg (Calculated) : 43.2 Heparin Dosing Weight: 55.8 kg  Vital Signs: Temp: 98.6 F (37 C) (11/05 0820) Temp Source: Oral (11/05 0820) BP: 113/54 (11/05 0820) Pulse Rate: 95 (11/05 0935)  Labs: Recent Labs    03/06/2018 0416  02/23/2018 1302 03/03/2018 2334 02/09/18 0556 2018-02-23 0527  HGB 9.4*  --  9.5*  --  8.6* 7.6*  HCT 31.7*  --  32.4*  --  29.5* 26.2*  PLT 159  --  156  --  159 158  LABPROT  --   --   --   --  14.7  --   INR  --   --   --   --  1.16  --   HEPARINUNFRC  --    < > 0.15* 0.61 0.47 0.38  CREATININE 1.05*  --   --   --  1.40*  --   TROPONINI <0.03  --   --   --   --   --    < > = values in this interval not displayed.    Estimated Creatinine Clearance: 31.1 mL/min (A) (by C-G formula based on SCr of 1.4 mg/dL (H)).   Medications:  Infusions:  . sodium chloride    . heparin 1,100 Units/hr (02/23/18 0345)    Assessment: 44 YOF who presented on 11/3 with L-flank pain and was found to have a splenic infarct - currently evaluating for source. Pharmacy consulted to dose Heparin for anticoagulation.   A Heparin level this morning remains therapeutic (HL 0.38 << 0.47, goal of 0.3-0.7). Hgb/Hct slight drop, plts wnl  Goal of Therapy:  Heparin level 0.3-0.7 units/ml Monitor platelets by anticoagulation protocol: Yes   Plan:  - Continue Heparin at 1100 units/hr (11 ml/hr) - Will continue to monitor for any signs/symptoms of bleeding and will follow up with heparin level in the a.m.   Thank you for allowing pharmacy to be a part of this patient's care.  Georgina Pillion, PharmD, BCPS Clinical Pharmacist Pager: [phone] Clinical phone for February 23, 2018 from 7a-3:30p: [phone] If after 3:30p, please call main pharmacy at: [phone] Please check AMION for all [facility]  numbers 23-Feb-2018 10:41 AM

## 2018-02-10 NOTE — Consult Note (Signed)
[facility]      Reason for Consult: fever    Referring Physician: Dr. Catha Gosselin    Patient ID: Madeline Harper, female    DOB: 09/11/1950, 67 y.o.   MRN: [id number]  HPI:   She has a history of mixed connective tissue disorder on plaquenil whose main complaint was left abdominal pain.  She was found to have a splenic infarct and has had fever.  No obvious etiology.  TEE pending but was aborted due to chest pain.  Blood cultures have remained negative.  Fever has been persistent but no signs of sepsis.  Some abdominal pain still, no chills.    Past Medical History:  Diagnosis Date  . Anemia   . Autoimmune disease (HCC)   . Bone spur of foot   . Cataract   . DDD (degenerative disc disease), cervical 01/26/2017  . History of kidney stones   . Proteinuria 02/09/2018   Pt of [place] kidney.  ?lupus nephritis   . Splenic infarct     Prior to Admission medications   Medication Sig Start Date End Date Taking? Authorizing Provider  acetaminophen (TYLENOL 8 HOUR ARTHRITIS PAIN) 650 MG CR tablet Take 650 mg by mouth every 8 (eight) hours as needed for pain.   Yes [provider]  azaTHIOprine (IMURAN) 50 MG tablet Take 2 tablets (100 mg total) by mouth daily. 11/05/17  Yes Copland, Gwenlyn Found, MD  calcium carbonate (CALCIUM 600) 600 MG TABS tablet Take 600 mg by mouth daily.   Yes [provider]  Ergocalciferol (VITAMIN D2 PO) Take 2,000 mg by mouth daily.    Yes [provider]  hydroxychloroquine (PLAQUENIL) 200 MG tablet Take 300 mg by mouth daily.  12/11/16  Yes [provider]  lisinopril (PRINIVIL,ZESTRIL) 5 MG tablet Take 5 mg by mouth daily. 01/30/18  Yes [provider]  Multiple Vitamins-Minerals (MULTI ADULT GUMMIES PO) Take 1 tablet by mouth daily.   Yes [provider]  Pyridoxine HCl (VITAMIN B-6) 500 MG tablet Take 500 mg by mouth daily.   Yes [provider]  vitamin B-12 (CYANOCOBALAMIN)  1000 MCG tablet Take 1,000 mcg by mouth daily.   Yes [provider]  ALPRAZolam Prudy Feeler) 0.25 MG tablet Take up to bid as needed for flying Patient not taking: Reported on 01/07/2018 11/05/17   Copland, Gwenlyn Found, MD  naproxen (NAPROSYN) 500 MG tablet Take 1 tablet (500 mg total) by mouth 2 (two) times daily. Limit [place] to 3-5 days Patient not taking: Reported on 03/05/2018 05/19/17   Horton, Mayer Masker, MD    Allergies  Allergen Reactions  . Lyrica [Pregabalin] Swelling    Pedal edema     Social History   Tobacco Use  . Smoking status: Former Smoker    Packs/day: 2.00    Years: 15.00    Pack years: 30.00    Types: Cigarettes    Last attempt to quit: 04/08/1980    Years since quitting: 37.8  . Smokeless tobacco: Never Used  Substance Use Topics  . Alcohol use: Yes    Alcohol/week: 0.0 standard drinks    Comment: Occ  . Drug use: No    Family History  Problem Relation Age of Onset  . Diabetes Mother   . Heart disease Mother   . Kidney failure Mother   . Cancer Mother        Uterine Cancer  . Diabetes Maternal Grandmother   . Kidney failure Maternal Grandmother   .  Stroke Sister   . Gout Maternal Grandfather   . Breast cancer Neg Hx     Review of Systems  Constitutional: negative for chills Gastrointestinal: negative for diarrhea All other systems reviewed and are negative    Constitutional: in no apparent distress and alert  Vitals:   02/26/2018 0935 02/28/2018 1256  BP:  116/76  Pulse: 95 96  Resp:  16  Temp:  98.4 F (36.9 C)  SpO2: 100% (!) 81%   EYES: anicteric ENMT: no thrush Cardiovascular: Cor RRR Respiratory: CTA B; normal respiratory effort GI: soft, tenderness in left side Musculoskeletal: no pedal edema noted Skin: negatives: no rash Hematologic: no cervical lad  Labs: Lab Results  Component Value Date   WBC 3.7 (L) 02/09/2018   HGB 8.7 (L) 02/07/2018   HCT 29.0 (L) 02/11/2018   MCV 90.7 02/11/2018   PLT 158 02/25/2018    Lab  Results  Component Value Date   CREATININE 1.40 (H) 02/09/2018   BUN 15 02/09/2018   NA 133 (L) 02/09/2018   K 3.8 02/09/2018   CL 108 02/09/2018   CO2 22 02/09/2018    Lab Results  Component Value Date   ALT 8 02/09/2018   AST 19 02/09/2018   ALKPHOS 45 02/09/2018   BILITOT 0.5 02/09/2018   INR 1.16 02/09/2018     Assessment: splenic infarct.  DDx includes infectious with endocarditis, APL syndrome.    Plan: 1) TEE 2) labs for APL syndrome

## 2018-02-10 NOTE — Progress Notes (Addendum)
PROGRESS NOTE    Madeline Harper  XLK:[id number] DOB: May 09, 1950 DOA: 03/05/2018 PCP: Pearline Cables, MD   Brief Narrative:  67 year old female with history of mixed connective tissue disorder, UIP presented with left flank pain that started Saturday morning.  Had recently been started on blood pressure medication for proteinuria.  CT chest abdomen pelvis with contrast suggestive of splenic infarct.  Question etiology.  TEE has been ordered and pending.  Infectious disease also consulted and patient continues to spike fevers, blood cultures remain negative. Assessment & Plan   Splenic infarct -CT abd/pelvis: large subscapsular splenic infarct, findings may be related to embolic process? endocoarditis? -CTA chest: no acute intrathoracic pathology. No evidence of PE. Findings of pulm  fibrosis.  -lower ext doppler negative -patient placed on IV heparin -Cardiology consulted for TEE  Fever/SIRS -presented with fever, tachycardia  -?related to above -Blood culture show no growth to date -UA unremarkable for infection -No leukocytosis -Continue to monitor -ID consulted and appreciated -will obtain CRP and pro-calcitonin  Acute kidney injury/ Proteinuria  -Creatinine up to 1.4, baseline less than 1 -Was recently placed on antiHTN meds -Continue to monitor BMP  Chest pressure -nonspecific, able to brush her teeth, wash up, but complains of pain 8/10 -trop negative, will continue to cycle -obtain EKG -obtain CXR -patient does have history of UIP -Cardiology consulted and appreciated  Chronic anemia -hemoglobin currently 8.7 appears to be stable, continue monitor CBC -anemia panel ordered  UIP/mixed connective tissue disorder -Continue azathioprine and plaquenil   Pain control -continue morphine and oxyIR PRN -bowel regimen  DVT Prophylaxis  heparin  Code Status: Full  Family Communication: Daughter via phone  Disposition Plan: Admitted. Pending TEE and ID  consult.  Consultants Infectious diseasea  Procedures  none  Antibiotics   Anti-infectives (From admission, onward)   Start     Dose/Rate Route Frequency Ordered Stop   03/03/2018 1200  hydroxychloroquine (PLAQUENIL) tablet 300 mg     300 mg Oral Daily 02/17/2018 1054        Subjective:   Madeline Harper seen and examined today.  Complains of chest pressure, states it is mid-sternal. Denies current shortness of breath, abdominal pain, N/V/D/C, dizziness, headache.   Objective:   Vitals:   02-18-2018 0343 02-18-2018 0431 02/18/2018 0820 02-18-2018 0935  BP:  126/68 (!) 113/54   Pulse:  (!) 112 96 95  Resp:  18 18   Temp: (!) 101.5 F (38.6 C) (!) 100.4 F (38 C) 98.6 F (37 C)   TempSrc: Oral Oral Oral   SpO2:  91% (!) 84% 100%  Weight:      Height:        Intake/Output Summary (Last 24 hours) at 02-18-2018 1256 Last data filed at 02-18-18 0900 Gross per 24 hour  Intake 206.55 ml  Output 0 ml  Net 206.55 ml   Filed Weights   03/06/2018 0350  Weight: 59.9 kg    Exam  General: Well developed, well nourished, NAD, appears stated age  HEENT: NCAT, mucous membranes moist.   Neck: Supple  Cardiovascular: S1 S2 auscultated, no rubs, murmurs or gallops. Regular rate and rhythm.  Respiratory: Clear to auscultation bilaterally with equal chest rise  Abdomen: Soft, nontender, nondistended, + bowel sounds  Extremities: warm dry without cyanosis clubbing or edema  Neuro: AAOx3, nonfocal  Psych: Normal affect and demeanor    Data Reviewed: I have personally reviewed following labs and imaging studies  CBC: Recent Labs  Lab 02/27/2018 0416 02/25/2018 1302  02/09/18 0556 02/15/2018 0527 02/14/2018 1134  WBC 3.8* 4.9 5.4 3.7*  --   NEUTROABS 3.0  --   --   --   --   HGB 9.4* 9.5* 8.6* 7.6* 8.7*  HCT 31.7* 32.4* 29.5* 26.2* 29.0*  MCV 92.7 90.8 90.8 90.7  --   PLT 159 156 159 158  --    Basic Metabolic Panel: Recent Labs  Lab 02/21/2018 0416 02/09/18 0556  NA 137 133*  K  3.9 3.8  CL 106 108  CO2 25 22  GLUCOSE 100* 88  BUN 13 15  CREATININE 1.05* 1.40*  CALCIUM 8.4* 7.7*   GFR: Estimated Creatinine Clearance: 31.1 mL/min (A) (by C-G formula based on SCr of 1.4 mg/dL (H)). Liver Function Tests: Recent Labs  Lab 02/20/2018 0416 02/09/18 0556  AST 23 19  ALT 11 8  ALKPHOS 56 45  BILITOT 0.3 0.5  PROT 7.8 7.0  ALBUMIN 2.3* 1.6*   Recent Labs  Lab 03/06/2018 0416  LIPASE 27   No results for input(s): AMMONIA in the last 168 hours. Coagulation Profile: Recent Labs  Lab 02/09/18 0556  INR 1.16   Cardiac Enzymes: Recent Labs  Lab 02/26/2018 0416  TROPONINI <0.03   BNP (last 3 results) No results for input(s): PROBNP in the last 8760 hours. HbA1C: No results for input(s): HGBA1C in the last 72 hours. CBG: Recent Labs  Lab 02/09/18 1518 03/03/2018 0819 03/06/2018 0937 02/21/2018 1227 02/27/2018 1229  GLUCAP 127* 70 106* 43* 55*   Lipid Profile: No results for input(s): CHOL, HDL, LDLCALC, TRIG, CHOLHDL, LDLDIRECT in the last 72 hours. Thyroid Function Tests: No results for input(s): TSH, T4TOTAL, FREET4, T3FREE, THYROIDAB in the last 72 hours. Anemia Panel: Recent Labs    02/19/2018 1134  RETICCTPCT 0.9   Urine analysis:    Component Value Date/Time   COLORURINE YELLOW 02/13/2018 0351   APPEARANCEUR HAZY (A) 02/26/2018 0351   LABSPEC 1.020 02/19/2018 0351   PHURINE 5.0 02/16/2018 0351   GLUCOSEU NEGATIVE 02/27/2018 0351   HGBUR MODERATE (A) 03/04/2018 0351   BILIRUBINUR NEGATIVE 02/21/2018 0351   KETONESUR NEGATIVE 02/25/2018 0351   PROTEINUR 100 (A) 02/19/2018 0351   NITRITE NEGATIVE 02/23/2018 0351   LEUKOCYTESUR NEGATIVE 02/13/2018 0351   Sepsis Labs: @LABRCNTIP (procalcitonin:4,lacticidven:4)  ) Recent Results (from the past 240 hour(s))  Blood culture (routine x 2)     Status: None (Preliminary result)   Collection Time: 02/23/2018  6:15 AM  Result Value Ref Range Status   Specimen Description   Final    BLOOD RIGHT  ARM Performed at [facility], [address] [place], [place] [address]    Special Requests   Final    BOTTLES DRAWN AEROBIC AND ANAEROBIC Blood Culture adequate volume Performed at [facility], [address] [place], [place] [address]    Culture   Final    NO GROWTH < 24 HOURS Performed at [facility], [address], [place], [place] [address]    Report Status PENDING  Incomplete  Blood culture (routine x 2)     Status: None (Preliminary result)   Collection Time: 02/20/2018  6:15 AM  Result Value Ref Range Status   Specimen Description   Final    BLOOD LEFT HAND Performed at [facility], [address] [place], [place] [address]    Special Requests   Final    BOTTLES DRAWN AEROBIC AND ANAEROBIC Blood Culture adequate volume  Performed at [facility], [address] [place], [place] [address]    Culture   Final    NO GROWTH < 24 HOURS Performed at [facility], [address], [place], [place] [address]    Report Status PENDING  Incomplete  Culture, blood (single)     Status: None (Preliminary result)   Collection Time: 03/07/2018  4:40 PM  Result Value Ref Range Status   Specimen Description BLOOD LEFT ANTECUBITAL  Final   Special Requests   Final    BOTTLES DRAWN AEROBIC ONLY Blood Culture adequate volume   Culture   Final    NO GROWTH < 24 HOURS Performed at [facility], [address], [place], [place] [address]    Report Status PENDING  Incomplete      Radiology Studies: Vas [place] Lower Extremity Venous (dvt)  Result Date: 02/09/2018  Lower Venous Study Indications: Splenic infarction.  Performing Technologist: Sherren Kerns RVS  Examination Guidelines: A complete evaluation includes B-mode imaging, spectral Doppler, color Doppler, and power Doppler as needed of all accessible portions of each vessel. Bilateral testing is considered an integral part of a complete examination. Limited examinations for  reoccurring indications may be performed as noted.  Right Venous Findings: +---------+---------------+---------+-----------+----------+-------+          CompressibilityPhasicitySpontaneityPropertiesSummary +---------+---------------+---------+-----------+----------+-------+ CFV      Full           Yes      Yes                          +---------+---------------+---------+-----------+----------+-------+ SFJ      Full                                                 +---------+---------------+---------+-----------+----------+-------+ FV Prox  Full                                                 +---------+---------------+---------+-----------+----------+-------+ FV Mid   Full                                                 +---------+---------------+---------+-----------+----------+-------+ FV DistalFull                                                 +---------+---------------+---------+-----------+----------+-------+ PFV      Full                                                 +---------+---------------+---------+-----------+----------+-------+ POP      Full           Yes      Yes                          +---------+---------------+---------+-----------+----------+-------+  PTV      Full                                                 +---------+---------------+---------+-----------+----------+-------+ PERO     Full                                                 +---------+---------------+---------+-----------+----------+-------+  Left Venous Findings: +---------+---------------+---------+-----------+----------+-------+          CompressibilityPhasicitySpontaneityPropertiesSummary +---------+---------------+---------+-----------+----------+-------+ CFV      Full           Yes      Yes                          +---------+---------------+---------+-----------+----------+-------+ SFJ      Full                                                  +---------+---------------+---------+-----------+----------+-------+ FV Prox  Full                                                 +---------+---------------+---------+-----------+----------+-------+ FV Mid   Full                                                 +---------+---------------+---------+-----------+----------+-------+ FV DistalFull                                                 +---------+---------------+---------+-----------+----------+-------+ PFV      Full                                                 +---------+---------------+---------+-----------+----------+-------+ POP      Full           Yes      Yes                          +---------+---------------+---------+-----------+----------+-------+ PTV      Full                                                 +---------+---------------+---------+-----------+----------+-------+ PERO     Full                                                 +---------+---------------+---------+-----------+----------+-------+  Summary: Right: There is no evidence of deep vein thrombosis in the lower extremity. Left: There is no evidence of deep vein thrombosis in the lower extremity.  *See table(s) above for measurements and observations. Electronically signed by Fabienne Bruns MD on 02/09/2018 at 6:56:55 PM.    Final      Scheduled Meds: . azaTHIOprine  100 mg Oral Daily  . docusate sodium  100 mg Oral BID  . hydroxychloroquine  300 mg Oral Daily  . polyethylene glycol  17 g Oral Daily  . sodium chloride flush  3 mL Intravenous Q12H   Continuous Infusions: . sodium chloride    . heparin 1,100 Units/hr (02/12/2018 0345)     LOS: 1 day   Time Spent in minutes   45 minutes (greater than 50% of time spent with patient face to face, as well as reviewing old records, calling consults, and formulating a plan)   Edsel Petrin D.O. on 02/06/2018 at 12:56 PM  Between 7am to 7pm - Please see pager  noted on [email or web address]  After 7pm go to [email or web address]  And look for the night coverage person covering for me after hours  [facility] Office  [phone]

## 2018-02-10 NOTE — Telephone Encounter (Signed)
Called her and LMOM on her cell that we are thinking of her. Let me know if I can help

## 2018-02-10 NOTE — Telephone Encounter (Signed)
Copied from CRM [id number]. Topic: General - Other >> 02-26-18  7:48 AM Leafy Ro wrote: Reason for CRM:pt is calling and she would like dr copland to know she is at [facility] hospital room [id number]. Pt was admitted on 03/06/2018. Pt can be reached by her mobile phone

## 2018-02-10 NOTE — Progress Notes (Signed)
  Echocardiogram Echocardiogram Transesophageal has been performed.  Madeline Harper 20-Feb-2018, 4:40 PM

## 2018-02-10 NOTE — Consult Note (Addendum)
Cardiology Consult    Patient ID: Madeline Harper MRN: [id number], DOB/AGE: 1950/09/25   Admit date: Mar 07, 2018 Date of Consult: 02/07/2018  Primary Physician: Pearline Cables, MD Primary Cardiologist:  Dr. Allyson Sabal  Requesting Provider: Dr. Catha Gosselin  Patient Profile    Madeline Harper is a 67 y.o. female with a history of lupus, mixed connective tissue disorder, and usual interstitial pneumonia, who is being seen today for the evaluation of chest pain at the request of Dr. Catha Gosselin.  History of Present Illness    Madeline Harper is a 67 year old female with the above history who was admitted on 03-07-2018 for a splenic infarct after presenting to the ED for evaluation of left flank pain. 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 Patient currently on heparin. She was placed on telemetry to rule out atrial fibrillation. She is also scheduled for a TEE today to rule out vegetation/cardiac source for embolization. Infectious disease has been consulted because patient has been spiking fevers during admission but blood cultures have remained negative. Cardiology was consulted today for evaluation of chest pain. EKG shows no acute ST changes compared to prior tracing. Troponin negative.   Unable to obtain history from patient due to moderate sedation from TEE. However, I spoke with patient's daughter who said chest pain started about 4 days ago and has been getting worse. Daughter describes the pain as pleuritic and states it seems to start on her left flank and radiates up her chest and neck every time she takes a deep breath. Patient previously saw Dr. Allyson Sabal in 2017 for a vascular evaluation prior to elective left foot surgery for a bone spur. However, she never had a cardiac workup.   Past Medical History   Past Medical History:  Diagnosis Date  . Anemia   . Autoimmune disease (HCC)   . Bone spur of foot   . Cataract   . DDD (degenerative disc disease), cervical  01/26/2017  . History of kidney stones   . Proteinuria 02/09/2018   Pt of [place] kidney.  ?lupus nephritis   . Splenic infarct     Past Surgical History:  Procedure Laterality Date  . CATARACT EXTRACTION, BILATERAL    . FOOT SURGERY    . KIDNEY STONE SURGERY  1972  . TUBAL LIGATION  1970     Allergies  Allergies  Allergen Reactions  . Lyrica [Pregabalin] Swelling    Pedal edema     Inpatient Medications    . [MAR Hold] azaTHIOprine  100 mg Oral Daily  . [MAR Hold] docusate sodium  100 mg Oral BID  . [MAR Hold] hydroxychloroquine  300 mg Oral Daily  . [MAR Hold] polyethylene glycol  17 g Oral Daily  . [MAR Hold] sodium chloride flush  3 mL Intravenous Q12H    Family History    Family History  Problem Relation Age of Onset  . Diabetes Mother   . Heart disease Mother   . Kidney failure Mother   . Cancer Mother        Uterine Cancer  . Diabetes Maternal Grandmother   . Kidney failure Maternal Grandmother   . Stroke Sister   . Gout Maternal Grandfather   . Breast cancer Neg Hx    She indicated that her mother is deceased. She indicated that her father is deceased. She indicated that her sister is alive. She indicated that her maternal grandmother is deceased. She indicated that her maternal grandfather is deceased. She  indicated that the status of her neg hx is unknown.   Social History    Social History   Socioeconomic History  . Marital status: Married    Spouse name: Madhavi Hamblen  . Number of children: Not on file  . Years of education: Not on file  . Highest education level: Not on file  Occupational History  . Not on file  Social Needs  . Financial resource strain: Not hard at all  . Food insecurity:    Worry: Never true    Inability: Never true  . Transportation needs:    Medical: No    Non-medical: No  Tobacco Use  . Smoking status: Former Smoker    Packs/day: 2.00    Years: 15.00    Pack years: 30.00    Types: Cigarettes    Last attempt  to quit: 04/08/1980    Years since quitting: 37.8  . Smokeless tobacco: Never Used  Substance and Sexual Activity  . Alcohol use: Yes    Alcohol/week: 0.0 standard drinks    Comment: Occ  . Drug use: No  . Sexual activity: Yes    Birth control/protection: None    Comment: tubal ligation  Lifestyle  . Physical activity:    Days per week: 5 days    Minutes per session: Not on file  . Stress: Not on file  Relationships  . Social connections:    Talks on phone: More than three times a week    Gets together: Twice a week    Attends religious service: More than 4 times per year    Active member of club or organization: Yes    Attends meetings of clubs or organizations: More than 4 times per year    Relationship status: Married  . Intimate partner violence:    Fear of current or ex partner: No    Emotionally abused: No    Physically abused: No    Forced sexual activity: No  Other Topics Concern  . Not on file  Social History Narrative   Lives with husband. Pt works in [employer]     Review of Systems    Review of Systems  Unable to perform ROS: Other (sedation following procedure)  Constitutional: Positive for fever.  Cardiovascular: Positive for chest pain.    Physical Exam    Blood pressure 116/76, pulse 96, temperature 98.4 F (36.9 C), temperature source Oral, resp. rate 16, height 4\' 11"  (1.499 m), weight 59.9 kg, SpO2 93 %.  General: 67 y.o. African-American female resting comfortably in no acute distress. Currently coming off moderate sedation. HEENT: Normal  Neck: Supple.  Lungs: No increased work of breathing. Upper airway noises noted but lungs sound clear anteriorly.  Heart: Borderline tachycardic with regular rhythm. Distinct S1 and S2. No murmurs, gallops, or rubs appreciated. Abdomen: Abdomen soft, non-distended, and non-tender to palpation. Bowel sounds present. Extremities: No lower extremity edema. Radial pulses 2+ and equal bilaterally. All extremities warm to  the touch with no evidence of cyanosis.  Neuro: No focal deficits. Moves all extremities spontaneously. Psych: Drowsy from sedation.  Labs    Troponin (Point of Care Test) No results for input(s): TROPIPOC in the last 72 hours. Recent Labs    03/06/2018 0416  TROPONINI <0.03   Lab Results  Component Value Date   WBC 3.7 (L) 02/15/2018   HGB 8.7 (L) 15-Feb-2018   HCT 29.0 (L) 2018-02-15   MCV 90.7 2018/02/15   PLT 158 February 15, 2018    Recent Labs  Lab 02/09/18 0556  NA 133*  K 3.8  CL 108  CO2 22  BUN 15  CREATININE 1.40*  CALCIUM 7.7*  PROT 7.0  BILITOT 0.5  ALKPHOS 45  ALT 8  AST 19  GLUCOSE 88   No results found for: CHOL, HDL, LDLCALC, TRIG Lab Results  Component Value Date   DDIMER 0.60 (H) 03/03/2017     Radiology Studies    Ct Angio Chest Pe W And/or Wo Contrast  Result Date: 03/05/2018 CLINICAL DATA:  67 year old female with left-sided chest/abdominal pain. EXAM: CT ANGIOGRAPHY CHEST WITH CONTRAST TECHNIQUE: Multidetector CT imaging of the chest was performed using the standard protocol during bolus administration of intravenous contrast. Multiplanar CT image reconstructions and MIPs were obtained to evaluate the vascular anatomy. CONTRAST:  ISOVUE-370 IOPAMIDOL (ISOVUE-370) INJECTION 76% COMPARISON:  Abdominal CT dated 02/09/2018 and chest CT dated 01/15/2018 FINDINGS: Cardiovascular: There is mild cardiomegaly. No pericardial effusion. Mild atherosclerotic calcification of the aortic arch. No aneurysmal dilatation or dissection. There is no CT evidence of pulmonary embolism. Mediastinum/Nodes: Mildly prominent right hilar lymph node measures 10 mm. Mildly enlarged mediastinal lymph node measures 14 mm anterior to the carina. Left hilar lymph node measure 13 mm. Mildly enlarged bilateral axillary lymph nodes as well as left subpectoral lymph nodes. The esophagus is poorly visualized. No mediastinal fluid collection. Lungs/Pleura: There is mild eventration of  the left hemidiaphragm. Diffuse interstitial coarsening with bibasilar linear scarring and subpleural reticulation and honeycombing consistent with pulmonary fibrosis. Mild lower lobe predominant bronchiectatic changes. No focal consolidation. There is no pleural effusion or pneumothorax. The central airways are patent. Upper Abdomen: See report for accompanying abdominal CT study. Musculoskeletal: Degenerative changes of the spine. No acute osseous pathology. Review of the MIP images confirms the above findings. IMPRESSION: 1. No acute intrathoracic pathology. No CT evidence of pulmonary embolism. 2. Findings of pulmonary fibrosis. 3. Mildly enlarged mediastinal and hilar lymph nodes, nonspecific and similar to prior CT. Clinical correlation is recommended. Electronically Signed   By: Elgie Collard M.D.   On: 02/16/2018 05:26   Ct Abdomen Pelvis W Contrast  Result Date: 02/13/2018 CLINICAL DATA:  68 year old female with left chest/abdominal pain. EXAM: CT ABDOMEN AND PELVIS WITH CONTRAST TECHNIQUE: Multidetector CT imaging of the abdomen and pelvis was performed using the standard protocol following bolus administration of intravenous contrast. CONTRAST:  ISOVUE-370 IOPAMIDOL (ISOVUE-370) INJECTION 76% COMPARISON:  Chest CT dated 02/23/2018. FINDINGS: Lower chest: Please see report for the accompanying chest CT. There is no intra-abdominal free air or free fluid. Hepatobiliary: The liver is unremarkable. No intrahepatic biliary ductal dilatation. The gallbladder is mildly distended otherwise unremarkable. Pancreas: The pancreas is unremarkable. Spleen: Large areas of hypoenhancement predominantly in the periphery and subcapsular region of the spleen extending from the mid to lower pole with irregular areas of hypoenhancement/non enhancement in the superior pole most consistent with subcapsular splenic infarct. Findings may be related to an embolic process possibly origin ating from the heart such as  infective endocarditis. Clinical correlation is recommended. There is mild perisplenic stranding and small amount of fluid. Adrenals/Urinary Tract: The adrenal glands are unremarkable. A 1 cm left renal hypodense lesion is suboptimally characterized but most likely represents a cyst. There is no hydronephrosis on either side. There is symmetric enhancement and excretion of contrast by both kidneys. The visualized ureters appear unremarkable. The urinary bladder is minimally distended. Stomach/Bowel: Small scattered sigmoid diverticula without active inflammatory changes. There is no evidence of bowel obstruction or active  inflammation. Normal appendix. Vascular/Lymphatic: Mild aortoiliac atherosclerotic disease. The origins of the celiac axis, SMA, IMA and the renal arteries are patent. Evaluation of the splenic artery is very limited on this study. There is however apparent focal area of wall thickening or plaque or thrombus in the midportion of the splenic artery (series 4, image 19 and coronal series 7, image 46) with associated luminal narrowing. The SMV, splenic vein, and main portal vein are patent as visualized. No portal venous gas. Mildly enlarged retroperitoneal lymph nodes at the level of the bifurcation of the aorta measure up to 10 mm in short axis. Mildly enlarged right common iliac node measures 10 mm in short axis. Reproductive: The uterus is grossly unremarkable. There is a subcentimeter hypodense lesion in the left ovary with possible fat attenuation which may represent a dermoid. The right ovary is grossly unremarkable. Pelvic ultrasound or MRI may provide better characterization. Other: None Musculoskeletal: No acute or significant osseous findings. IMPRESSION: 1. Large subcapsular splenic infarct. Findings may be related to an embolic process possibly originating from the heart such as infective endocarditis. Clinical correlation is recommended. 2. Focal area of wall apparent thickening or  thrombus in the midportion of the splenic artery with associated luminal narrowing. 3. Mildly enlarged retroperitoneal and right common iliac lymph nodes. 4. Sigmoid diverticulosis. No bowel obstruction or active inflammation. Normal appendix. 5. Possible small left ovarian dermoid. Electronically Signed   By: Elgie Collard M.D.   On: 02-11-2018 05:42   US Renal  Result Date: 02/05/2018 CLINICAL DATA:  Chronic kidney disease and proteinuria. History of diabetes, hypertension, HIV. EXAM: RENAL / URINARY TRACT ULTRASOUND COMPLETE COMPARISON:  01/15/2018 FINDINGS: Right Kidney: Renal measurements: 10.4 centimeters. Echogenicity within normal limits. No mass or hydronephrosis visualized. Left Kidney: Renal measurements: 10.0 centimeters. A cyst in the UPPER pole region is 1.9 x 1.4 x 1.2 centimeters. Echogenicity is normal. No hydronephrosis. Bladder: The bladder is decompressed. IMPRESSION: Benign LEFT renal cyst. No hydronephrosis. Electronically Signed   By: Norva Pavlov M.D.   On: 02/05/2018 08:28   Ct Chest High Resolution  Result Date: 01/15/2018 CLINICAL DATA:  Increasing shortness of breath on exertion for 2 months. Interstitial lung disease. EXAM: CT CHEST WITHOUT CONTRAST TECHNIQUE: Multidetector CT imaging of the chest was performed following the standard protocol without intravenous contrast. High resolution imaging of the lungs, as well as inspiratory and expiratory imaging, was performed. COMPARISON:  03/03/2017 and 05/28/2016. FINDINGS: Cardiovascular: Atherosclerotic calcification of the arterial vasculature. Heart is mildly enlarged. No pericardial effusion. Mediastinum/Nodes: Numerous low internal jugular, mediastinal and axillary lymph nodes, as before. Index mediastinal lymph node in the right paratracheal station measures 1.4 cm, stable. Index axillary lymph node on the right measures 1.4 cm, stable. Hilar regions are difficult to evaluate without IV contrast. Esophagus is grossly  unremarkable. Lungs/Pleura: Peripheral and basilar predominant pattern of subpleural reticulation, traction bronchiectasis/bronchiolectasis, grossly stable from 05/28/2016. Associated honeycombing. No pleural fluid. Airway is unremarkable. No air trapping. Upper Abdomen: Visualized portions of the liver, adrenal glands, kidneys, spleen, pancreas, stomach and bowel are grossly unremarkable. 10 mm gastrohepatic ligament lymph node, stable. Musculoskeletal: Degenerative changes in the spine. No worrisome lytic or sclerotic lesions. IMPRESSION: 1. 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 Am Rosezetta Schlatter Crit Care Med Vol 198, Iss 5, [phone], Dec 07 2016. 2. Chronic borderline mediastinal and bilateral axillary adenopathy. Difficult to exclude an underlying lymphoproliferative disorder.  3.  Aortic atherosclerosis (ICD10-170.0). Electronically Signed   By: Leanna Battles M.D.   On: 01/15/2018 13:15   Vas [place] Lower Extremity Venous (dvt)  Result Date: 02/09/2018  Lower Venous Study Indications: Splenic infarction.  Performing Technologist: Sherren Kerns RVS  Examination Guidelines: A complete evaluation includes B-mode imaging, spectral Doppler, color Doppler, and power Doppler as needed of all accessible portions of each vessel. Bilateral testing is considered an integral part of a complete examination. Limited examinations for reoccurring indications may be performed as noted.  Right Venous Findings: +---------+---------------+---------+-----------+----------+-------+          CompressibilityPhasicitySpontaneityPropertiesSummary +---------+---------------+---------+-----------+----------+-------+ CFV      Full           Yes      Yes                          +---------+---------------+---------+-----------+----------+-------+ SFJ      Full                                                  +---------+---------------+---------+-----------+----------+-------+ FV Prox  Full                                                 +---------+---------------+---------+-----------+----------+-------+ FV Mid   Full                                                 +---------+---------------+---------+-----------+----------+-------+ FV DistalFull                                                 +---------+---------------+---------+-----------+----------+-------+ PFV      Full                                                 +---------+---------------+---------+-----------+----------+-------+ POP      Full           Yes      Yes                          +---------+---------------+---------+-----------+----------+-------+ PTV      Full                                                 +---------+---------------+---------+-----------+----------+-------+ PERO     Full                                                 +---------+---------------+---------+-----------+----------+-------+  Left Venous Findings: +---------+---------------+---------+-----------+----------+-------+  CompressibilityPhasicitySpontaneityPropertiesSummary +---------+---------------+---------+-----------+----------+-------+ CFV      Full           Yes      Yes                          +---------+---------------+---------+-----------+----------+-------+ SFJ      Full                                                 +---------+---------------+---------+-----------+----------+-------+ FV Prox  Full                                                 +---------+---------------+---------+-----------+----------+-------+ FV Mid   Full                                                 +---------+---------------+---------+-----------+----------+-------+ FV DistalFull                                                  +---------+---------------+---------+-----------+----------+-------+ PFV      Full                                                 +---------+---------------+---------+-----------+----------+-------+ POP      Full           Yes      Yes                          +---------+---------------+---------+-----------+----------+-------+ PTV      Full                                                 +---------+---------------+---------+-----------+----------+-------+ PERO     Full                                                 +---------+---------------+---------+-----------+----------+-------+    Summary: Right: There is no evidence of deep vein thrombosis in the lower extremity. Left: There is no evidence of deep vein thrombosis in the lower extremity.  *See table(s) above for measurements and observations. Electronically signed by Fabienne Bruns MD on 02/09/2018 at 6:56:55 PM.    Final     EKG     EKG: EKG was personally reviewed and demonstrates: normal sinus rhythm, rate 97, with LVH and no significant ST/T changes from prior tracings.   Cardiac Imaging    Echocardiogram 01/15/2018: Study Conclusions: - Left ventricle: The cavity size was normal. Systolic function was   normal. The estimated ejection fraction was in the  range of 55%   to 60%. Wall motion was normal; there were no regional wall   motion abnormalities. Doppler parameters are consistent with   abnormal left ventricular relaxation (grade 1 diastolic   dysfunction). - Atrial septum: A patent foramen ovale cannot be excluded. - Pulmonary arteries: PA peak pressure: 39 mm Hg (S).  Impressions: - Normal LVEF - Mild TR - Can not exclude PFO  Assessment & Plan    1. Atypical Chest Pain - Unable to obtain history from patient as she was coming off moderate sedation from TEE procedure. According to patient's daughter, patient has been having worsening pleuritic chest pain for the past 4 days. - EKG shows no  acute ischemic changes compared to prior tracings. - Troponin negative. Continue to trend. - May consider adding Carvedilol for blood pressure and heart rate control. Will defer to MD. - Suspect pain may be secondary to splenic infarct. Coronary CTA a possibility; however, patient's has some renal insufficiency so would recommend holding off on anything with contrast dye at this time. If TEE shows no wall motion abnormalities and troponin remains negative, do not think any additional ischemic evaluation is necessary at this time.   2. Splenic Infarct - CTA abdomen/pelvis showed large subscapular splenic infarct.. - Patient currently on Heparin.  - TEE performed this afternoon. Report pending. - Per primary team  3. AKI - SCr 1.40 yesterday (up from 1.05 on 02/27/2018). - Continue to hold home Lisinopril.  - Continue to monitor daily renal function. - Per primary team  Signed, Corrin Parker, PA-C 03/06/2018, 3:12 PM   I have examined the patient and reviewed assessment and plan and discussed with patient.  Agree with above as stated.  Pain is very atypical.  She may be having referred pain from her splenic infarct.  She may have some inflammation related to her lupus.  Pain worsewith breathing.  She is about to receive oxycodone.  Could try dilaudid as well if pain cannot be controlled.   I spoke with Dr. Duke Salvia who saw the patient at TEE.  THe patient was having severe pain even prior to the TEE.  There were no issues getting the probe down for the procedure.    I think this is noncardiac pain.  I would not pursue cardiac w/u at this time.   Lance Muss   For questions or updates, please contact   Please consult [email or web address] for contact info under Cardiology/STEMI.

## 2018-02-10 NOTE — CV Procedure (Signed)
Brief TEE Note  LVEF 55-60% No LA/LAA thrombus or mass Trivial MR [place] microcavitation study revealed no shunting at rest and very mild shunting R-->L with abdominal pressure.  No ASD/PFO noted on color flow Doppler.  Cannot rule out a small PFO.   During this procedure the patient is administered a total of Versed 3 mg and Fentanyl 50 mcg to achieve and maintain moderate conscious sedation.  The patient's heart rate, blood pressure, and oxygen saturation are monitored continuously during the procedure. The period of conscious sedation is 15 minutes, of which I was present face-to-face 100% of this time.  For additional details see full report.  Wrigley Plasencia C. Duke Salvia, MD, [facility] 02/26/2018 4:19 PM

## 2018-02-10 NOTE — Consult Note (Signed)
Please see attending H and P of same day for full details.     [facility]    Date of Admission:  02/21/2018   Total days of antibiotics 0              Reason for Consult: Fevers    Referring Provider: Dr. Catha Gosselin Primary Care Provider: Pearline Cables  Assessment: 67 yo pmhx of mixed connective tissue diseae admitted for splenic infarcts with fevers. Denies any fevers/chills or URI symptoms prior to admission. TTE on 10/10 w/o evidence of endocarditis.  Tmax 102.1, lactic acid normal,blood cultures NGTD. UA negative, no luekocytosis, HIV nonreactive. Cr increased from baseline of ~0.8 to now 1.40  CTA w/o evidence of PE but significant for pulmonary fibrosis and lymphadenopathy. CT abdomen/pelviswith splenic infarct. Pt did have recemt left axillary node biopsy with reactive lymphoid hyperplasia.   Plan: 1. TEE to evaluate embolic source 2. Will hold off on abx for now 3. Monitor fever curve 4. 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 azaTHIOprine  100 mg Oral Daily  . docusate sodium  100 mg Oral BID  . hydroxychloroquine  300 mg Oral Daily  . polyethylene glycol  17 g Oral Daily  . sodium chloride flush  3 mL Intravenous Q12H   Continuous Infusions: . sodium chloride    . heparin 1,100 Units/hr (02/22/2018 0345)   PRN Meds:.sodium chloride, acetaminophen **OR** acetaminophen, bisacodyl, morphine injection, ondansetron **OR** ondansetron (ZOFRAN) IV, oxyCODONE  HPI: Madeline Harper is a 67 y.o. female with pmhx of mixed connective tissue disorder. She presented to the ED on 11/3 with left flank pain that started on Saturday morning. Pain worsened so she come to the hospital. Describes the pain as inspiratory, sharp and stabbing. Denies nausea but did have one episode of nonbloody  emesis on hospitalization day 1. No diarrhea or constipation. Does report some inspiratory chest pain that has worsened since admission. Relieved with morphine. Reports fever on Saturday prior to going to the ED. Prior to all of this she was her normal self. Denies any fevers/chills, sinus headaches/pressure, congestion, SOB, sputum, chest pain or other viral illness symptoms prior to Saturday; she was in her normal state of health.   Review of Systems: Review of Systems  Constitutional: Positive for fever and malaise/fatigue. Negative for chills.  HENT: Negative for congestion, ear pain, sinus pain and sore throat.   Respiratory: Positive for shortness of breath. Negative for cough and sputum production.   Cardiovascular: Positive for chest pain. Negative for leg swelling.  Gastrointestinal: Positive for abdominal pain and vomiting. Negative for diarrhea and nausea.  Genitourinary: Positive for flank pain.  Skin: Negative for rash.    Past Medical History:  Diagnosis Date  . Anemia   . Autoimmune disease (HCC)   . Bone spur of foot   . Cataract   . DDD (degenerative disc disease), cervical 01/26/2017  . History of kidney stones   . Proteinuria 02/09/2018   Pt of [place] kidney.  ?lupus nephritis   . Splenic infarct     Social History   Tobacco Use  . Smoking status: Former Smoker    Packs/day: 2.00    Years: 15.00    Pack years: 30.00    Types: Cigarettes    Last attempt to  quit: 04/08/1980    Years since quitting: 37.8  . Smokeless tobacco: Never Used  Substance Use Topics  . Alcohol use: Yes    Alcohol/week: 0.0 standard drinks    Comment: Occ  . Drug use: No    Family History  Problem Relation Age of Onset  . Diabetes Mother   . Heart disease Mother   . Kidney failure Mother   . Cancer Mother        Uterine Cancer  . Diabetes Maternal Grandmother   . Kidney failure Maternal Grandmother   . Stroke Sister   . Gout Maternal Grandfather   . Breast cancer Neg Hx      Allergies  Allergen Reactions  . Lyrica [Pregabalin] Swelling    Pedal edema     OBJECTIVE: Blood pressure (!) 113/54, pulse 95, temperature 98.6 F (37 C), temperature source Oral, resp. rate 18, height 4\' 11"  (1.499 m), weight 59.9 kg, SpO2 100 %.  Physical Exam  Constitutional: She is oriented to person, place, and time. She appears well-developed and well-nourished.  HENT:  Head: Normocephalic and atraumatic.  Right Ear: External ear normal.  Left Ear: External ear normal.  Eyes: EOM are normal.  Neck: Normal range of motion.  Cardiovascular: Normal rate, regular rhythm and normal heart sounds.  No murmur heard. Pulmonary/Chest: Effort normal. She has wheezes.  Abdominal: Soft. She exhibits no distension. There is no tenderness. There is no rebound and no guarding.  Musculoskeletal: Normal range of motion.  Neurological: She is alert and oriented to person, place, and time.  Skin: Skin is warm and dry.  Psychiatric: She has a normal mood and affect.    Lab Results Lab Results  Component Value Date   WBC 3.7 (L) 03/05/2018   HGB 7.6 (L) 03-05-2018   HCT 26.2 (L) March 05, 2018   MCV 90.7 03-05-2018   PLT 158 March 05, 2018    Lab Results  Component Value Date   CREATININE 1.40 (H) 02/09/2018   BUN 15 02/09/2018   NA 133 (L) 02/09/2018   K 3.8 02/09/2018   CL 108 02/09/2018   CO2 22 02/09/2018    Lab Results  Component Value Date   ALT 8 02/09/2018   AST 19 02/09/2018   ALKPHOS 45 02/09/2018   BILITOT 0.5 02/09/2018     Microbiology: Recent Results (from the past 240 hour(s))  Blood culture (routine x 2)     Status: None (Preliminary result)   Collection Time: 02/07/2018  6:15 AM  Result Value Ref Range Status   Specimen Description   Final    BLOOD RIGHT ARM Performed at [facility], [address] [place], [place] [address]    Special Requests   Final    BOTTLES DRAWN AEROBIC AND ANAEROBIC Blood Culture adequate volume Performed at [facility], [address] [place], [place] [address]    Culture   Final    NO GROWTH < 24 HOURS Performed at [facility], [address], [place], [place] [address]    Report Status PENDING  Incomplete  Blood culture (routine x 2)     Status: None (Preliminary result)   Collection Time: 02/11/2018  6:15 AM  Result Value Ref Range Status   Specimen Description   Final    BLOOD LEFT HAND Performed at [facility], [address] [place], [place] [address]    Special Requests   Final    BOTTLES DRAWN AEROBIC  AND ANAEROBIC Blood Culture adequate volume Performed at [facility], [address] [place], [place] [address]    Culture   Final    NO GROWTH < 24 HOURS Performed at [facility], [address], [place], [place] [address]    Report Status PENDING  Incomplete  Culture, blood (single)     Status: None (Preliminary result)   Collection Time: 02/20/2018  4:40 PM  Result Value Ref Range Status   Specimen Description BLOOD LEFT ANTECUBITAL  Final   Special Requests   Final    BOTTLES DRAWN AEROBIC ONLY Blood Culture adequate volume   Culture   Final    NO GROWTH < 24 HOURS Performed at [facility], [address] [place], [place] [address]    Report Status PENDING  Incomplete    April  A Peterson, MD [facility] [phone] pager   [phone] cell 03/01/2018, 11:06 AM

## 2018-02-11 ENCOUNTER — Encounter (HOSPITAL_COMMUNITY): Payer: Self-pay | Admitting: Cardiovascular Disease

## 2018-02-11 DIAGNOSIS — R0789 Other chest pain: Secondary | ICD-10-CM

## 2018-02-11 LAB — BASIC METABOLIC PANEL
Anion gap: 7 (ref 5–15)
BUN: 21 mg/dL (ref 8–23)
CALCIUM: 7.9 mg/dL — AB (ref 8.9–10.3)
CO2: 19 mmol/L — ABNORMAL LOW (ref 22–32)
CREATININE: 1.71 mg/dL — AB (ref 0.44–1.00)
Chloride: 109 mmol/L (ref 98–111)
GFR, EST AFRICAN AMERICAN: 35 mL/min — AB (ref >60)
GFR, EST NON AFRICAN AMERICAN: 30 mL/min — AB (ref >60)
GLUCOSE: 107 mg/dL — AB (ref 70–99)
Potassium: 4 mmol/L (ref 3.5–5.1)
Sodium: 135 mmol/L (ref 135–145)

## 2018-02-11 LAB — CBC
HEMATOCRIT: 27.7 % — AB (ref 36.0–46.0)
Hemoglobin: 8.3 g/dL — ABNORMAL LOW (ref 12.0–15.0)
MCH: 27 pg (ref 26.0–34.0)
MCHC: 30 g/dL (ref 30.0–36.0)
MCV: 90.2 fL (ref 80.0–100.0)
NRBC: 0 % (ref 0.0–0.2)
Platelets: 167 10*3/uL (ref 150–400)
RBC: 3.07 MIL/uL — ABNORMAL LOW (ref 3.87–5.11)
RDW: 15 % (ref 11.5–15.5)
WBC: 4.7 10*3/uL (ref 4.0–10.5)

## 2018-02-11 LAB — HEPARIN LEVEL (UNFRACTIONATED): Heparin Unfractionated: 0.34 IU/mL (ref 0.30–0.70)

## 2018-02-11 LAB — TROPONIN I: Troponin I: <0.03 ng/mL (ref <0.03)

## 2018-02-11 MED ORDER — FERROUS SULFATE 325 (65 FE) MG PO TABS
325.0000 mg | ORAL_TABLET | Freq: Two times a day (BID) | ORAL | Status: DC
  Administered 2018-02-12 – 2018-02-16 (×9): 325 mg via ORAL
  Filled 2018-02-11 (×10): qty 1

## 2018-02-11 MED ORDER — FERROUS SULFATE 325 (65 FE) MG PO TABS: 325.0000 mg | ORAL_TABLET | Freq: Two times a day (BID) | ORAL | Status: DC

## 2018-02-11 MED ORDER — SODIUM CHLORIDE 0.9 % IV SOLN
510.0000 mg | Freq: Once | INTRAVENOUS | Status: AC
  Administered 2018-02-11: 510 mg via INTRAVENOUS
  Filled 2018-02-11: qty 17

## 2018-02-11 NOTE — Progress Notes (Signed)
ANTICOAGULATION CONSULT NOTE - Follow Up Consult  Pharmacy Consult for Heparin Indication: Splenic infarct  Patient Measurements: Height: 4\' 11"  (149.9 cm) Weight: 132 lb (59.9 kg) IBW/kg (Calculated) : Madeline.2 Heparin Dosing Weight: 55.8 kg  Vital Signs: Temp: 99.5 F (37.5 C) (11/06 0818) Temp Source: Oral (11/06 0818) BP: 103/59 (11/06 0818) Pulse Rate: 93 (11/06 0818)  Labs: Recent Labs    02/09/18 0556 02/21/2018 0527 03/01/2018 1134 02/15/2018 1419 02/27/2018 1905 02/11/18 0228  HGB 8.6* 7.6* 8.7*  --   --  8.3*  HCT 29.5* 26.2* 29.0*  --   --  27.7*  PLT 159 158  --   --   --  167  LABPROT 14.7  --   --   --   --   --   INR 1.16  --   --   --   --   --   HEPARINUNFRC 0.47 0.38  --   --   --  0.34  CREATININE 1.40*  --   --   --   --  1.71*  TROPONINI  --   --   --  <0.03 <0.03 <0.03    Estimated Creatinine Clearance: 25.5 mL/min (A) (by C-G formula based on SCr of 1.71 mg/dL (H)).   Medications:  Infusions:  . sodium chloride    . heparin 1,100 Units/hr (02/11/18 0222)    Assessment: Madeline Harper who presented on 11/3 with L-flank pain and was found to have a splenic infarct - currently evaluating for source. Pharmacy consulted to dose Heparin for anticoagulation.   A Heparin level this morning remains therapeutic (HL 0.34 << 0.38, goal of 0.3-0.7). Hgb/Hct slight drop, plts wnl  Goal of Therapy:  Heparin level 0.3-0.7 units/ml Monitor platelets by anticoagulation protocol: Yes   Plan:  - Continue Heparin at 1100 units/hr (11 ml/hr) - Will follow-up plans for oral anticoagulation - Will continue to monitor for any signs/symptoms of bleeding and will follow up with heparin level in the a.m.   Thank you for allowing pharmacy to be a part of this patient's care.  Georgina Pillion, PharmD, BCPS Clinical Pharmacist Pager: [phone] Clinical phone for 02/11/2018 from 7a-3:30p: [phone] If after 3:30p, please call main pharmacy at: [phone] Please check AMION for all [facility]  Pharmacy numbers 02/11/2018 8:49 AM

## 2018-02-11 NOTE — Progress Notes (Signed)
Physical Therapy Treatment Patient Details Name: Madeline Harper MRN: [id number] DOB: 1950-12-31 Today's Date: 02/11/2018    History of Present Illness Pt is a 67 y/o female with a PMH significant for Lupus, cataracts, cervical DDD. She presented with complaints of L flank pain that started saturday morning 11/2, and was found to have a splenic infarct.     PT Comments    Pt was seen for exercises at bed level as she was wanting to refuse all therapy.  Pt agreed after doing the exercises to ask nursing to walk with her later to make up the session PT did not do.  Follow acutely for more mobility and exercise as tolerated.   Follow Up Recommendations  No PT follow up;Supervision - Intermittent     Equipment Recommendations  None recommended by PT    Recommendations for Other Services       Precautions / Restrictions Precautions Precautions: Fall Restrictions Weight Bearing Restrictions: No    Mobility  Bed Mobility Overal bed mobility: Modified Independent                Transfers                 General transfer comment: declined  Ambulation/Gait             General Gait Details: declined but agreed to ask nursing to take her later   Stairs             Wheelchair Mobility    Modified Rankin (Stroke Patients Only)       Balance                                            Cognition Arousal/Alertness: Awake/alert Behavior During Therapy: WFL for tasks assessed/performed Overall Cognitive Status: Within Functional Limits for tasks assessed                                        Exercises General Exercises - Lower Extremity Ankle Circles/Pumps: AAROM;Both;5 reps Quad Sets: AROM;Both;10 reps Gluteal Sets: AROM;Both;10 reps Heel Slides: AROM;Both;20 reps Hip ABduction/ADduction: AROM;Both;20 reps Straight Leg Raises: AROM;Both;10 reps Hip Flexion/Marching: AROM;Both;10 reps    General Comments         Pertinent Vitals/Pain Pain Assessment: No/denies pain    Home Living                      Prior Function            PT Goals (current goals can now be found in the care plan section) Acute Rehab PT Goals Patient Stated Goal: get home Progress towards PT goals: Progressing toward goals    Frequency    Min 3X/week      PT Plan Current plan remains appropriate    Co-evaluation              AM-PAC PT "6 Clicks" Daily Activity  Outcome Measure  Difficulty turning over in bed (including adjusting bedclothes, sheets and blankets)?: None Difficulty moving from lying on back to sitting on the side of the bed? : None Difficulty sitting down on and standing up from a chair with arms (e.g., wheelchair, bedside commode, etc,.)?: None Help needed moving to and from a bed to chair (including a wheelchair)?:  A Little Help needed walking in hospital room?: A Little Help needed climbing 3-5 steps with a railing? : A Little 6 Click Score: 21    End of Session   Activity Tolerance: Patient tolerated treatment well Patient left: with call bell/phone within reach;in bed;with family/visitor present Nurse Communication: Mobility status PT Visit Diagnosis: Unsteadiness on feet (R26.81);Difficulty in walking, not elsewhere classified (R26.2)     Time: 0981-1914 PT Time Calculation (min) (ACUTE ONLY): 21 min  Charges:  $Therapeutic Exercise: 8-22 mins                     Ivar Drape 02/11/2018, 12:33 PM   Samul Dada, PT MS Acute Rehab Dept. Number: [facility] [phone] and [facility] [phone]

## 2018-02-11 NOTE — Progress Notes (Signed)
Progress Note  Patient Name: Madeline Harper Date of Encounter: 02/11/2018  Primary Cardiologist: Dr. Allyson Sabal  Subjective   Pain improved from yesterday.  No swallowing problems reported.   Inpatient Medications    Scheduled Meds: . azaTHIOprine  100 mg Oral Daily  . docusate sodium  100 mg Oral BID  . [START ON 02/12/2018] ferrous sulfate  325 mg Oral BID WC  . hydroxychloroquine  300 mg Oral Daily  . polyethylene glycol  17 g Oral Daily  . sodium chloride flush  3 mL Intravenous Q12H   Continuous Infusions: . sodium chloride    . ferumoxytol    . heparin 1,100 Units/hr (02/11/18 0222)   PRN Meds: sodium chloride, acetaminophen **OR** acetaminophen, bisacodyl, morphine injection, ondansetron **OR** ondansetron (ZOFRAN) IV, oxyCODONE   Vital Signs    Vitals:   02-22-18 1834 02/22/18 2047 02/11/18 0444 02/11/18 0818  BP: 123/64 (!) 106/51 (!) 113/56 (!) 103/59  Pulse: (!) 115 (!) 109 (!) 129 93  Resp: 18 18 18 18   Temp: (!) 101 F (38.3 C) 98.9 F (37.2 C) (!) 100.7 F (38.2 C) 99.5 F (37.5 C)  TempSrc: Oral Oral Oral Oral  SpO2: 91% 94%  94%  Weight:      Height:        Intake/Output Summary (Last 24 hours) at 02/11/2018 1101 Last data filed at 02/11/2018 0444 Gross per 24 hour  Intake 44 ml  Output 0 ml  Net 44 ml   Filed Weights   02/06/2018 0350  Weight: 59.9 kg    Telemetry    Sinus tach - Personally Reviewed  ECG      Physical Exam   GEN: No acute distress.   Neck: No JVD Cardiac: RRR, no murmurs, rubs, or gallops.  Respiratory: Clear to auscultation bilaterally. GI: Soft, nontender, non-distended  MS: No edema; No deformity. Neuro:  Nonfocal  Psych: Normal affect   Labs    Chemistry Recent Labs  Lab 02/25/2018 0416 02/09/18 0556 02/11/18 0228  NA 137 133* 135  K 3.9 3.8 4.0  CL 106 108 109  CO2 25 22 19*  GLUCOSE 100* 88 107*  BUN 13 15 21   CREATININE 1.05* 1.40* 1.71*  CALCIUM 8.4* 7.7* 7.9*  PROT 7.8 7.0  --   ALBUMIN 2.3*  1.6*  --   AST 23 19  --   ALT 11 8  --   ALKPHOS 56 45  --   BILITOT 0.3 0.5  --   GFRNONAA 54* 38* 30*  GFRAA >60 44* 35*  ANIONGAP 6 3* 7     Hematology Recent Labs  Lab 02/09/18 0556 2018/02/22 0527 02-22-18 1134 02/11/18 0228  WBC 5.4 3.7*  --  4.7  RBC 3.25* 2.89* 3.18* 3.07*  HGB 8.6* 7.6* 8.7* 8.3*  HCT 29.5* 26.2* 29.0* 27.7*  MCV 90.8 90.7  --  90.2  MCH 26.5 26.3  --  27.0  MCHC 29.2* 29.0*  --  30.0  RDW 15.0 14.8  --  15.0  PLT 159 158  --  167    Cardiac Enzymes Recent Labs  Lab 03/02/2018 0416 22-Feb-2018 1419 02-22-2018 1905 02/11/18 0228  TROPONINI <0.03 <0.03 <0.03 <0.03   No results for input(s): TROPIPOC in the last 168 hours.   BNPNo results for input(s): BNP, PROBNP in the last 168 hours.   DDimer No results for input(s): DDIMER in the last 168 hours.   Radiology    Dg Chest 2 View  Result Date: Feb 22, 2018  CLINICAL DATA:  Chest pressure. History of autoimmune disease. Proteinuria. EXAM: CHEST - 2 VIEW COMPARISON:  CT 2 days ago. FINDINGS: Comparing to the CT, there appears to be worsening of bilateral lower lobe infiltrate/volume loss. Upper lungs remain largely clear. No visible effusion. IMPRESSION: Worsening of lower lobe infiltrates in volume loss when compared to the CT of 2 days ago. Electronically Signed   By: Paulina Fusi M.D.   On: 02/16/2018 20:11    Cardiac Studies   TEE: LVEF 55-60%, no source of embolism  Patient Profile     67 y.o. female with splenic infarct  Assessment & Plan    Ruled out for MI, in the setting of hours of constant pain.  No furtehr ischemic testing planned at this time.  Pain control for splenic infarct. Medical rx for autoimmune disease.  [facility] will sign off.   Medication Recommendations:  Pain control Other recommendations (labs, testing, etc):     For questions or updates, please contact [facility] Please consult [email or web address] for contact info under        Signed, Lance Muss,  MD  02/11/2018, 11:01 AM

## 2018-02-11 NOTE — Progress Notes (Signed)
[facility] PROGRESS NOTE  Madeline Harper ZOX:[id number] DOB: 06-Feb-1951 DOA: Feb 09, 2018 PCP: Pearline Cables, MD   Narrative: 67 year old female Features of lupus, mixed connective tissue disorder diagnosed 01/2016 and on Plaquenil since then with a break in 06/2016-on Plaquenil 300 daily and Imuran (Imuran was recommended for UIP) Prior history of STDs in the distant past Admitted on 02/09/2017 acute splenic infarct?  Vasculitis Needed IV pain meds on admission   A & Plan Splenic infarct-Infectious? Vasculitic?-ECHO as part of work-up no specific findings currently-it appears that there is no current need for antibiotics and I have ordered antiphospholipid panel, lupus anticoagulant cardiolipin antibodies and beta-2 glycoprotein to rule out antiphospholipid syndrome-my suspicion is she will still require regardless of those results long-term anticoagulation explained that to her clearly at this morning  Mixed connective tissue disorder with features of lupus-we will need outpatient follow-up with Dr. Victorino Dike of rheumatology who she sees-they may be able to offer further guidance  Pain secondary to splenic infarct-I have encouraged the patient to continue oral pain management and we can probably wean off of IV morphine if needed I have encouraged ambulation as well  Fever-likely secondary to splenic infarct-lactic acid is low and procalcitonin is low not suggestive of infection agree with holding antibiotics  AKI-creatinine is risen from 1.5 on admission to 1.7-lisinopril 5 mg was discontinued on admission and she has been discontinued off for Naprosyn she will continue saline 15 cc/h  Iron deficiency anemia-iron 10, saturation ratios 9 will probably need IV iron which will be dosed  Mild obesity BMI 26   DVT prophylaxis: On IV heparin code Status: Full code family Communication: Discussed with daughter disposition Plan: Inpatient   Madeline Christopoulos, MD  [facility] Direct  contact: [phone] --Via amion app OR  --[email or web address]; password [email or web address]  7PM-7AM contact night coverage as above 02/11/2018, 8:54 AM  LOS: 2 days   Consultants:  No  Procedures:  No  Antimicrobials:  No  Interval history/Subjective: Awake alert pleasant no distress Some pain but no overt severe discomfort Eating some food may be about 30 to 40% of diet No dark or tarry stool  Objective:  Vitals:  Vitals:   02/11/18 0444 02/11/18 0818  BP: (!) 113/56 (!) 103/59  Pulse: (!) 129 93  Resp: 18 18  Temp: (!) 100.7 F (38.2 C) 99.5 F (37.5 C)  SpO2:  94%    Exam:  Awake alert pleasant no distress no pallor no icterus Chest is clear S1-S2 no murmur rub or gallop Abdomen is soft with no overt tenderness over splenic area No lower extremity edema Neurologically intact no focal deficit  I have personally reviewed the following:   Labs:  Creatinine of 1.7 from 1.05 from admission  Imaging studies:  Done on admission 11/3  Medical tests:  n   Test discussed with performing physician:  n  Decision to obtain old records:  n  Review and summation of old records:  y  Scheduled Meds: . azaTHIOprine  100 mg Oral Daily  . docusate sodium  100 mg Oral BID  . hydroxychloroquine  300 mg Oral Daily  . polyethylene glycol  17 g Oral Daily  . sodium chloride flush  3 mL Intravenous Q12H   Continuous Infusions: . sodium chloride    . 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

## 2018-02-12 LAB — CBC
HCT: 23.3 % — ABNORMAL LOW (ref 36.0–46.0)
Hemoglobin: 7 g/dL — ABNORMAL LOW (ref 12.0–15.0)
MCH: 27.3 pg (ref 26.0–34.0)
MCHC: 30 g/dL (ref 30.0–36.0)
MCV: 91 fL (ref 80.0–100.0)
PLATELETS: 157 10*3/uL (ref 150–400)
RBC: 2.56 MIL/uL — ABNORMAL LOW (ref 3.87–5.11)
RDW: 15.3 % (ref 11.5–15.5)
WBC: 3.7 10*3/uL — ABNORMAL LOW (ref 4.0–10.5)
nRBC: 0 % (ref 0.0–0.2)

## 2018-02-12 LAB — RENAL FUNCTION PANEL
ANION GAP: 6 (ref 5–15)
Albumin: 1.4 g/dL — ABNORMAL LOW (ref 3.5–5.0)
BUN: 27 mg/dL — ABNORMAL HIGH (ref 8–23)
CALCIUM: 8 mg/dL — AB (ref 8.9–10.3)
CO2: 20 mmol/L — AB (ref 22–32)
CREATININE: 2.18 mg/dL — AB (ref 0.44–1.00)
Chloride: 110 mmol/L (ref 98–111)
GFR calc Af Amer: 26 mL/min — ABNORMAL LOW (ref >60)
GFR calc non Af Amer: 22 mL/min — ABNORMAL LOW (ref >60)
GLUCOSE: 77 mg/dL (ref 70–99)
Phosphorus: 3.9 mg/dL (ref 2.5–4.6)
Potassium: 4.4 mmol/L (ref 3.5–5.1)
SODIUM: 136 mmol/L (ref 135–145)

## 2018-02-12 LAB — LUPUS ANTICOAGULANT PANEL
DRVVT: 31.4 s (ref 0.0–47.0)
PTT Lupus Anticoagulant: 34.1 s (ref 0.0–51.9)

## 2018-02-12 LAB — SODIUM, URINE, RANDOM: Sodium, Ur: 35 mmol/L

## 2018-02-12 LAB — CREATININE, URINE, RANDOM: CREATININE, URINE: 146.33 mg/dL

## 2018-02-12 LAB — HEMOGLOBIN AND HEMATOCRIT, BLOOD
HCT: 26.1 % — ABNORMAL LOW (ref 36.0–46.0)
Hemoglobin: 7.5 g/dL — ABNORMAL LOW (ref 12.0–15.0)

## 2018-02-12 LAB — CARDIOLIPIN ANTIBODIES, IGG, IGM, IGA
Anticardiolipin IgG: <9 GPL U/mL (ref 0–14)
Anticardiolipin IgM: <9 MPL U/mL (ref 0–12)

## 2018-02-12 LAB — HEPARIN LEVEL (UNFRACTIONATED): HEPARIN UNFRACTIONATED: 0.52 [IU]/mL (ref 0.30–0.70)

## 2018-02-12 LAB — FACTOR 5 LEIDEN

## 2018-02-12 MED ORDER — APIXABAN 5 MG PO TABS
10.0000 mg | ORAL_TABLET | Freq: Two times a day (BID) | ORAL | Status: DC
  Administered 2018-02-12 – 2018-02-13 (×2): 10 mg via ORAL
  Filled 2018-02-12 (×2): qty 2

## 2018-02-12 MED ORDER — LIDOCAINE VISCOUS HCL 2 % MT SOLN: 15.0000 mL | Freq: Four times a day (QID) | OROMUCOSAL | Status: DC | PRN

## 2018-02-12 MED ORDER — APIXABAN 5 MG PO TABS: 5.0000 mg | ORAL_TABLET | Freq: Two times a day (BID) | ORAL | Status: DC

## 2018-02-12 NOTE — Progress Notes (Signed)
ANTICOAGULATION CONSULT NOTE - Initial Consult  Pharmacy Consult for Apixaban (from heparin) Indication: splenic infarct  Allergies  Allergen Reactions  . Lyrica [Pregabalin] Swelling    Pedal edema     Patient Measurements: Height: 4\' 11"  (149.9 cm) Weight: 132 lb (59.9 kg) IBW/kg (Calculated) : 43.2 Heparin Dosing Weight: 55.8 kg  Vital Signs: Temp: 98.7 F (37.1 C) (11/07 1006) Temp Source: Oral (11/07 1006) BP: 114/60 (11/07 1006) Pulse Rate: 102 (11/07 1006)  Labs: Recent Labs    02/20/2018 0527  02/23/2018 1419 03/04/2018 1905 02/11/18 0228 02/12/18 0622 02/12/18 0951  HGB 7.6*   < >  --   --  8.3* 7.0* 7.5*  HCT 26.2*   < >  --   --  27.7* 23.3* 26.1*  PLT 158  --   --   --  167 157  --   HEPARINUNFRC 0.38  --   --   --  0.34 0.52  --   CREATININE  --   --   --   --  1.71* 2.18*  --   TROPONINI  --   --  <0.03 <0.03 <0.03  --   --    < > = values in this interval not displayed.    Estimated Creatinine Clearance: 20 mL/min (A) (by C-G formula based on SCr of 2.18 mg/dL (H)).   Medical History: Past Medical History:  Diagnosis Date  . Anemia   . Autoimmune disease (HCC)   . Bone spur of foot   . Cataract   . DDD (degenerative disc disease), cervical 01/26/2017  . History of kidney stones   . Proteinuria 02/09/2018   Pt of [place] kidney.  ?lupus nephritis   . Splenic infarct     Medications:  Medications Prior to Admission  Medication Sig Dispense Refill Last Dose  . acetaminophen (TYLENOL 8 HOUR ARTHRITIS PAIN) 650 MG CR tablet Take 650 mg by mouth every 8 (eight) hours as needed for pain.   as needed  . azaTHIOprine (IMURAN) 50 MG tablet Take 2 tablets (100 mg total) by mouth daily. 60 tablet 1 02/07/2018 at Unknown time  . calcium carbonate (CALCIUM 600) 600 MG TABS tablet Take 600 mg by mouth daily.   02/07/2018 at Unknown time  . Ergocalciferol (VITAMIN D2 PO) Take 2,000 mg by mouth daily.    02/07/2018 at Unknown time  . hydroxychloroquine  (PLAQUENIL) 200 MG tablet Take 300 mg by mouth daily.    02/07/2018 at Unknown time  . lisinopril (PRINIVIL,ZESTRIL) 5 MG tablet Take 5 mg by mouth daily.  3 02/07/2018 at Unknown time  . Multiple Vitamins-Minerals (MULTI ADULT GUMMIES PO) Take 1 tablet by mouth daily.   02/07/2018 at Unknown time  . Pyridoxine HCl (VITAMIN B-6) 500 MG tablet Take 500 mg by mouth daily.   02/07/2018 at Unknown time  . vitamin B-12 (CYANOCOBALAMIN) 1000 MCG tablet Take 1,000 mcg by mouth daily.   02/07/2018 at Unknown time  . ALPRAZolam (XANAX) 0.25 MG tablet Take up to bid as needed for flying (Patient not taking: Reported on 01/07/2018) 10 tablet 0 Not Taking at Unknown time  . naproxen (NAPROSYN) 500 MG tablet Take 1 tablet (500 mg total) by mouth 2 (two) times daily. Limit [place] to 3-5 days (Patient not taking: Reported on 2018/02/21) 10 tablet 0 Not Taking at Unknown time    Assessment: 30 YOF who presented on 11/3 with L-flank pain and was found to have a splenic infarct - currently evaluating for source.  Pharmacy consulted to dose apixaban (transition from heparin) for anticoagulation.   Goal of Therapy:  Anticoagulation Monitor platelets by anticoagulation protocol: Yes   Plan:  Stop heparin and give first dose of apixaban. Start apixaban 10 mg PO BID x 7 days, followed by 5 mg BID Monitor renal function and for signs and symptoms of bleeding   Thank you for allowing [place] to participate in this patients care.   Signe Colt, PharmD Please utilize Amion (under [facility]) for appropriate number for your unit pharmacist. 02/12/2018 4:19 PM

## 2018-02-12 NOTE — Progress Notes (Signed)
No signs of infectious source. Agree with anticoagulation.  No antibiotics indicated. I am available as needed.

## 2018-02-12 NOTE — Telephone Encounter (Signed)
Patient called back just to let the Dr know she was reaching out to her. She is still in the Hospital at [facility]

## 2018-02-12 NOTE — Care Management Important Message (Signed)
Important Message  Patient Details  Name: Madeline Harper MRN: [id number] Date of Birth: 09-Aug-1950   Medicare Important Message Given:  Yes    Aarilyn Dye Stefan Church 02/12/2018, 2:01 PM

## 2018-02-12 NOTE — Progress Notes (Signed)
Bladder scan <50, Paged MD, will not place foley at this time

## 2018-02-12 NOTE — Consult Note (Signed)
[facility] CM Primary Care Navigator  02/12/2018  Madeline Harper March 25, 1951 [id number]   Met with patient and daughter at the bedside to identify possible discharge needs.  Patient reportshaving severe abdominal pain radiating to left side/ flank that resulted to this admission. (splenic infarct, UIP- usual interstitial pneumonitis, vasculitis, anemia, left flank pain, mixed connective tissue disorder)  Patient endorsesDr. Janett Billow Copland with [place] [facility] as herprimary care provider.   [facility] [address] [place] [facility] without any problem.   Patientstatesthat she has been managing her own medications at homestraight out of the containers.  Daughterreports that patient has beendriving prior to admission but her husband Saralyn Pilar) or her niece Karl Bales) will be able to providetransportation toherdoctors' appointments if needed after discharge.  Patient lives with her husband who will serve as her primary caregiver at home.  Anticipated discharge plan ishomeper MD who was present during this visit.  Patientand daughter hadvoiced understanding to call primary care provider's office for a post discharge follow-up appointment within1- 2 weeks orsooner if needs arise. Patient letter (with PCP's contact number) wasprovided as their reminder.  Discussed with patientand daughter regarding [facility] services available for health management andresourcesat homebut patient denies needing any servicesfor now. Patientand daughter voicedunderstandingof needto seekreferral from primary care provider to [facility] care management ifdeemed necessary and appropriatefor anyservicesin the future.  [facility] care management information was provided for futureneeds thatshemay have.  However, patient hadverbally agreedand optedforEMMIcalls tofollow-up with her recovery at home.    Referral made for [facility] General calls after discharge.   For additional questions please contact:  Edwena Felty A. Garcia Dalzell, BSN, RN-BC [facility] PRIMARY CARE Navigator Cell: [phone]

## 2018-02-12 NOTE — Progress Notes (Signed)
[facility] PROGRESS NOTE  Madeline Harper DGL:[id number] DOB: Apr 29, 1950 DOA: 02/18/2018 PCP: Pearline Cables, MD   Narrative: 67 year old female Features of lupus, mixed connective tissue disorder diagnosed 01/2016 and on Plaquenil since then with a break in 06/2016-on Plaquenil 300 daily and Imuran (Imuran was recommended for UIP) Prior history of STDs in the distant past Admitted on 02/09/2017 acute splenic infarct?  Vasculitis Needed IV pain meds on admission   A & Plan Splenic infarct-Infectious? 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 Victorino Dike of rheumatology who she sees-they may be able to offer further guidance  Throat pain-add viscous lidocaine--no evidence of infecton or cough  Pain secondary to splenic infarct-I have encouraged the patient to continue oral pain management and we can probably wean off of IV morphine if needed I have encouraged ambulation as well  Fever-likely secondary to splenic infarct-lactic acid is low and procalcitonin is low not suggestive of infection agree with holding antibiotics  AKI-not improved-possble Pre-renal ATN based on FeNa 0.4-ACe held/off  Naprosyn-hold IVF from here and reassess-If wosrse would get US kidney  Iron deficiency anemia-iron 10, saturation ratios 9 given IV iron-cont Ferrous sulph  Mild obesity BMI 26   DVT prophylaxis: On IV heparin code Status: Full code family Communication: Discussed with daughter disposition Plan: Inpatient   Madeline Belt, MD  [facility] Direct contact:  [phone] --Via amion app OR  --[email or web address]; password [email or web address]  7PM-7AM contact night coverage as above 02/12/2018, 3:57 PM  LOS: 3 days   Consultants:  No  Procedures:  No  Antimicrobials:  No  Interval history/Subjective: Fair no new issue Mild low grade fever  No cp  Passing urine She has a mild discomfort in chest   Objective:  Vitals:  Vitals:   02/12/18 0440 02/12/18 1006  BP: (!) 109/56 114/60  Pulse: 93 (!) 102  Resp: 18 18  Temp: 99 F (37.2 C) 98.7 F (37.1 C)  SpO2: 92%     Exam:  Awake alert pleasant no distress no pallor no icterus Chest is clear S1-S2 no murmur rub or gallop Abdomen is soft with no overt tenderness over splenic area No lower extremity edema Neurologically intact no focal deficit  I have personally reviewed the following:   Labs:  Creatinine of 1.7 from 1.05 from admission  Imaging studies:  Done on admission 11/3  Medical tests:  n   Test discussed with performing physician:  n  Decision to obtain old records:  n  Review and summation of old records:  y  Scheduled Meds: . azaTHIOprine  100 mg Oral Daily  . docusate sodium  100 mg Oral BID  . ferrous sulfate  325 mg Oral BID WC  . hydroxychloroquine  300 mg Oral Daily  . polyethylene glycol  17 g Oral Daily  . sodium chloride flush  3 mL Intravenous Q12H   Continuous Infusions: . sodium chloride    . 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

## 2018-02-12 NOTE — Progress Notes (Signed)
Discussed with patient Had renal USG and beginning work-up for proteinuria noted on 11/3  I am worried that she might have some Connective tissue related disorder-will ask nephrology to see and commen tin am if renal fucntion worse

## 2018-02-12 NOTE — Telephone Encounter (Signed)
Called her and spoke with her- she had a splenic infarct She is feeling better  Hopes she will be discharged soon

## 2018-02-12 NOTE — Progress Notes (Signed)
ANTICOAGULATION CONSULT NOTE - Follow Up Consult  Pharmacy Consult for Heparin Indication: Splenic infarct  Patient Measurements: Height: 4\' 11"  (149.9 cm) Weight: 132 lb (59.9 kg) IBW/kg (Calculated) : 43.2 Heparin Dosing Weight: 55.8 kg  Vital Signs: Temp: 99 F (37.2 C) (11/07 0440) Temp Source: Oral (11/07 0440) BP: 109/56 (11/07 0440) Pulse Rate: 93 (11/07 0440)  Labs: Recent Labs    2018/03/10 0527 03/10/2018 1134 10-Mar-2018 1419 2018/03/10 1905 02/11/18 0228 02/12/18 0622  HGB 7.6* 8.7*  --   --  8.3* 7.0*  HCT 26.2* 29.0*  --   --  27.7* 23.3*  PLT 158  --   --   --  167 157  HEPARINUNFRC 0.38  --   --   --  0.34 0.52  CREATININE  --   --   --   --  1.71* 2.18*  TROPONINI  --   --  <0.03 <0.03 <0.03  --     Estimated Creatinine Clearance: 20 mL/min (A) (by C-G formula based on SCr of 2.18 mg/dL (H)).   Medications:  Infusions:  . sodium chloride    . heparin 1,100 Units/hr (02/12/18 0343)    Assessment: 72 YOF who presented on 11/3 with L-flank pain and was found to have a splenic infarct - currently evaluating for source. Pharmacy consulted to dose Heparin for anticoagulation.   A Heparin level this morning remains therapeutic (HL 0.52 << 0.34 , goal of 0.3-0.7). Hgb down to 7 << 8.3 - will cont to watch, no active bleeding noted, plts wnl  Goal of Therapy:  Heparin level 0.3-0.7 units/ml Monitor platelets by anticoagulation protocol: Yes   Plan:  - Continue Heparin at 1100 units/hr (11 ml/hr) - Will follow-up plans for oral anticoagulation - Will continue to monitor for any signs/symptoms of bleeding and will follow up with heparin level in the a.m.   Thank you for allowing pharmacy to be a part of this patient's care.  Georgina Pillion, PharmD, BCPS Clinical Pharmacist Pager: [phone] Clinical phone for 02/12/2018 from 7a-3:30p: [phone] If after 3:30p, please call main pharmacy at: [phone] Please check AMION for all [facility] Pharmacy numbers 02/12/2018 8:54  AM

## 2018-02-13 DIAGNOSIS — R809 Proteinuria, unspecified: Secondary | ICD-10-CM | POA: Diagnosis not present

## 2018-02-13 LAB — CBC
HEMATOCRIT: 27.5 % — AB (ref 36.0–46.0)
HEMOGLOBIN: 8 g/dL — AB (ref 12.0–15.0)
MCH: 26.4 pg (ref 26.0–34.0)
MCHC: 29.1 g/dL — AB (ref 30.0–36.0)
MCV: 90.8 fL (ref 80.0–100.0)
Platelets: 209 10*3/uL (ref 150–400)
RBC: 3.03 MIL/uL — ABNORMAL LOW (ref 3.87–5.11)
RDW: 15.1 % (ref 11.5–15.5)
WBC: 3.1 10*3/uL — ABNORMAL LOW (ref 4.0–10.5)
nRBC: 0 % (ref 0.0–0.2)

## 2018-02-13 LAB — PROTHROMBIN GENE MUTATION

## 2018-02-13 LAB — CULTURE, BLOOD (ROUTINE X 2)
CULTURE: NO GROWTH
Culture: NO GROWTH
SPECIAL REQUESTS: ADEQUATE
SPECIAL REQUESTS: ADEQUATE

## 2018-02-13 LAB — RENAL FUNCTION PANEL
Albumin: 1.4 g/dL — ABNORMAL LOW (ref 3.5–5.0)
Anion gap: 3 — ABNORMAL LOW (ref 5–15)
BUN: 23 mg/dL (ref 8–23)
CHLORIDE: 113 mmol/L — AB (ref 98–111)
CO2: 23 mmol/L (ref 22–32)
CREATININE: 2.05 mg/dL — AB (ref 0.44–1.00)
Calcium: 8.4 mg/dL — ABNORMAL LOW (ref 8.9–10.3)
GFR calc Af Amer: 28 mL/min — ABNORMAL LOW (ref >60)
GFR, EST NON AFRICAN AMERICAN: 24 mL/min — AB (ref >60)
GLUCOSE: 88 mg/dL (ref 70–99)
POTASSIUM: 4 mmol/L (ref 3.5–5.1)
Phosphorus: 4.1 mg/dL (ref 2.5–4.6)
Sodium: 139 mmol/L (ref 135–145)

## 2018-02-13 LAB — URIC ACID: URIC ACID, SERUM: 6.6 mg/dL (ref 2.5–7.1)

## 2018-02-13 LAB — BETA-2-GLYCOPROTEIN I ABS, IGG/M/A
Beta-2-Glycoprotein I IgA: <9 GPI IgA units (ref 0–25)
Beta-2-Glycoprotein I IgM: <9 GPI IgM units (ref 0–32)

## 2018-02-13 LAB — CULTURE, BLOOD (SINGLE)
Culture: NO GROWTH
Special Requests: ADEQUATE

## 2018-02-13 LAB — PATHOLOGIST SMEAR REVIEW

## 2018-02-13 MED ORDER — ELIQUIS 5 MG VTE STARTER PACK: ORAL_TABLET | ORAL | 0 refills | Status: AC

## 2018-02-13 MED ORDER — SODIUM CHLORIDE 0.9 % IV SOLN
INTRAVENOUS | Status: DC
  Administered 2018-02-13 – 2018-02-14 (×2): via INTRAVENOUS

## 2018-02-13 MED FILL — ELIQUIS STARTER PACK 5 MG T: 5 | 30 days supply | Qty: 74 | Fill #0

## 2018-02-13 NOTE — Consult Note (Signed)
Chief Complaint: Patient was seen in consultation today for random renal biopsy Chief Complaint  Patient presents with  . left sided pain   at the request of proteinuria   Supervising Physician: Malachy Moan  Patient Status: [facility] - Out-pt  History of Present Illness: Madeline Harper is a 67 y.o. female   Dr Ronalee Belts note today: History of mixed connective tissue disease, SLE diagnosed in 2017, interstitial lung disease undergoing evaluation with pulmonologist, proteinuria, presented with left flank pain found to have acute splenic infarct.  Patient was treated with anticoagulation and pain medication.  TEE was done to rule out any thromboembolism.  On admission patient had serum creatinine level of 1.05.  She got IV contrast during CT chest and abdomen pelvis.  The serum creatinine level started going up to 2.05 today.  Patient had urine output of around 2 L in 24 hours.  The urinalysis done during admission with 100 protein but no RBC WBC. Patient denied use of NSAIDS .   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 Anemia   . Autoimmune disease (HCC)   . Bone spur of foot   . Cataract   . DDD (degenerative disc disease), cervical 01/26/2017  . History of kidney stones   . Proteinuria 02/09/2018   Pt of [place] kidney.  ?lupus nephritis   . Splenic infarct     Past Surgical History:  Procedure Laterality Date  . CATARACT EXTRACTION, BILATERAL    . FOOT SURGERY    . KIDNEY STONE SURGERY  1972  . TEE WITHOUT CARDIOVERSION N/A 02/16/2018   Procedure: TRANSESOPHAGEAL ECHOCARDIOGRAM (TEE);  Surgeon: Chilton Si, MD;  Location: [facility] ENDOSCOPY;  Service: Cardiovascular;  Laterality: N/A;  . TUBAL LIGATION  1970    Allergies: Lyrica [pregabalin]  Medications: Prior to Admission medications   Medication Sig Start Date End Date Taking?  Authorizing Provider  acetaminophen (TYLENOL 8 HOUR ARTHRITIS PAIN) 650 MG CR tablet Take 650 mg by mouth every 8 (eight) hours as needed for pain.   Yes [provider]  azaTHIOprine (IMURAN) 50 MG tablet Take 2 tablets (100 mg total) by mouth daily. 11/05/17  Yes Copland, Gwenlyn Found, MD  calcium carbonate (CALCIUM 600) 600 MG TABS tablet Take 600 mg by mouth daily.   Yes [provider]  Ergocalciferol (VITAMIN D2 PO) Take 2,000 mg by mouth daily.    Yes [provider]  hydroxychloroquine (PLAQUENIL) 200 MG tablet Take 300 mg by mouth daily.  12/11/16  Yes [provider]  lisinopril (PRINIVIL,ZESTRIL) 5 MG tablet Take 5 mg by mouth daily. 01/30/18  Yes [provider]  Multiple Vitamins-Minerals (MULTI ADULT GUMMIES PO) Take 1 tablet by mouth daily.   Yes [provider]  Pyridoxine HCl (VITAMIN B-6) 500 MG tablet Take 500 mg by mouth daily.   Yes [provider]  vitamin B-12 (CYANOCOBALAMIN) 1000 MCG tablet Take 1,000 mcg by mouth daily.   Yes [provider]  ALPRAZolam Prudy Feeler) 0.25 MG tablet Take up to bid as needed for flying Patient not taking: Reported on 01/07/2018 11/05/17   Copland, Gwenlyn Found, MD  ELIQUIS STARTER PACK (ELIQUIS STARTER PACK) 5 MG TABS Take as directed on package: start with two-5mg  tablets twice daily for 7 days. On day 8, switch to one-5mg  tablet twice daily. 02/13/18   Rhetta Mura, MD  naproxen (  NAPROSYN) 500 MG tablet Take 1 tablet (500 mg total) by mouth 2 (two) times daily. Limit [place] to 3-5 days Patient not taking: Reported on 02/23/2018 05/19/17   Horton, Mayer Masker, MD     Family History  Problem Relation Age of Onset  . Diabetes Mother   . Heart disease Mother   . Kidney failure Mother   . Cancer Mother        Uterine Cancer  . Diabetes Maternal Grandmother   . Kidney failure Maternal Grandmother   . Stroke Sister   . Gout Maternal Grandfather   . Breast cancer Neg Hx     Social  History   Socioeconomic History  . Marital status: Married    Spouse name: Tanaysha Alkins  . Number of children: Not on file  . Years of education: Not on file  . Highest education level: Not on file  Occupational History  . Not on file  Social Needs  . Financial resource strain: Not hard at all  . Food insecurity:    Worry: Never true    Inability: Never true  . Transportation needs:    Medical: No    Non-medical: No  Tobacco Use  . Smoking status: Former Smoker    Packs/day: 2.00    Years: 15.00    Pack years: 30.00    Types: Cigarettes    Last attempt to quit: 04/08/1980    Years since quitting: 37.8  . Smokeless tobacco: Never Used  Substance and Sexual Activity  . Alcohol use: Yes    Alcohol/week: 0.0 standard drinks    Comment: Occ  . Drug use: No  . Sexual activity: Yes    Birth control/protection: None    Comment: tubal ligation  Lifestyle  . Physical activity:    Days per week: 5 days    Minutes per session: Not on file  . Stress: Not on file  Relationships  . Social connections:    Talks on phone: More than three times a week    Gets together: Twice a week    Attends religious service: More than 4 times per year    Active member of club or organization: Yes    Attends meetings of clubs or organizations: More than 4 times per year    Relationship status: Married  Other Topics Concern  . Not on file  Social History Narrative   Lives with husband. Pt works in Airline pilot     Review of Systems: A 12 point ROS discussed and pertinent positives are indicated in the HPI above.  All other systems are negative.  Review of Systems  Constitutional: Positive for activity change and fatigue. Negative for fever.  Respiratory: Negative for cough and shortness of breath.   Cardiovascular: Negative for chest pain.  Gastrointestinal: Negative for abdominal pain.  Neurological: Positive for weakness.  Psychiatric/Behavioral: Negative for behavioral problems and  confusion.    Vital Signs: BP 128/69 (BP Location: Left Arm)   Pulse 80   Temp 98.7 F (37.1 C) (Oral)   Resp 18   Ht 4\' 11"  (1.499 m)   Wt 132 lb (59.9 kg)   SpO2 95%   BMI 26.66 kg/m   Physical Exam  Constitutional: She is oriented to person, place, and time.  Cardiovascular: Normal rate and regular rhythm.  Pulmonary/Chest: Effort normal and breath sounds normal.  Abdominal: Soft. Bowel sounds are normal.  Musculoskeletal: Normal range of motion.  Neurological: She is alert and oriented to person, place, and time.  Skin: Skin is warm and dry.  Psychiatric: She has a normal mood and affect. Her behavior is normal. Judgment and thought content normal.  Vitals reviewed.   Imaging: Dg Chest 2 View  Result Date: 03/06/2018 CLINICAL DATA:  Chest pressure. History of autoimmune disease. Proteinuria. EXAM: CHEST - 2 VIEW COMPARISON:  CT 2 days ago. FINDINGS: Comparing to the CT, there appears to be worsening of bilateral lower lobe infiltrate/volume loss. Upper lungs remain largely clear. No visible effusion. IMPRESSION: Worsening of lower lobe infiltrates in volume loss when compared to the CT of 2 days ago. Electronically Signed   By: Paulina Fusi M.D.   On: 02/16/2018 20:11   Ct Angio Chest Pe W And/or Wo Contrast  Result Date: 02/26/2018 CLINICAL DATA:  66 year old female with left-sided chest/abdominal pain. EXAM: CT ANGIOGRAPHY CHEST WITH CONTRAST TECHNIQUE: Multidetector CT imaging of the chest was performed using the standard protocol during bolus administration of intravenous contrast. Multiplanar CT image reconstructions and MIPs were obtained to evaluate the vascular anatomy. CONTRAST:  ISOVUE-370 IOPAMIDOL (ISOVUE-370) INJECTION 76% COMPARISON:  Abdominal CT dated 03/06/2018 and chest CT dated 01/15/2018 FINDINGS: Cardiovascular: There is mild cardiomegaly. No pericardial effusion. Mild atherosclerotic calcification of the aortic arch. No aneurysmal dilatation or  dissection. There is no CT evidence of pulmonary embolism. Mediastinum/Nodes: Mildly prominent right hilar lymph node measures 10 mm. Mildly enlarged mediastinal lymph node measures 14 mm anterior to the carina. Left hilar lymph node measure 13 mm. Mildly enlarged bilateral axillary lymph nodes as well as left subpectoral lymph nodes. The esophagus is poorly visualized. No mediastinal fluid collection. Lungs/Pleura: There is mild eventration of the left hemidiaphragm. Diffuse interstitial coarsening with bibasilar linear scarring and subpleural reticulation and honeycombing consistent with pulmonary fibrosis. Mild lower lobe predominant bronchiectatic changes. No focal consolidation. There is no pleural effusion or pneumothorax. The central airways are patent. Upper Abdomen: See report for accompanying abdominal CT study. Musculoskeletal: Degenerative changes of the spine. No acute osseous pathology. Review of the MIP images confirms the above findings. IMPRESSION: 1. No acute intrathoracic pathology. No CT evidence of pulmonary embolism. 2. Findings of pulmonary fibrosis. 3. Mildly enlarged mediastinal and hilar lymph nodes, nonspecific and similar to prior CT. Clinical correlation is recommended. Electronically Signed   By: Elgie Collard M.D.   On: 02/16/2018 05:26   Ct Abdomen Pelvis W Contrast  Result Date: 03/04/2018 CLINICAL DATA:  67 year old female with left chest/abdominal pain. EXAM: CT ABDOMEN AND PELVIS WITH CONTRAST TECHNIQUE: Multidetector CT imaging of the abdomen and pelvis was performed using the standard protocol following bolus administration of intravenous contrast. CONTRAST:  ISOVUE-370 IOPAMIDOL (ISOVUE-370) INJECTION 76% COMPARISON:  Chest CT dated 02/28/2018. FINDINGS: Lower chest: Please see report for the accompanying chest CT. There is no intra-abdominal free air or free fluid. Hepatobiliary: The liver is unremarkable. No intrahepatic biliary ductal dilatation. The gallbladder  is mildly distended otherwise unremarkable. Pancreas: The pancreas is unremarkable. Spleen: Large areas of hypoenhancement predominantly in the periphery and subcapsular region of the spleen extending from the mid to lower pole with irregular areas of hypoenhancement/non enhancement in the superior pole most consistent with subcapsular splenic infarct. Findings may be related to an embolic process possibly origin ating from the heart such as infective endocarditis. Clinical correlation is recommended. There is mild perisplenic stranding and small amount of fluid. Adrenals/Urinary Tract: The adrenal glands are unremarkable. A 1 cm left renal hypodense lesion is suboptimally characterized but most likely represents a cyst. There is no  hydronephrosis on either side. There is symmetric enhancement and excretion of contrast by both kidneys. The visualized ureters appear unremarkable. The urinary bladder is minimally distended. Stomach/Bowel: Small scattered sigmoid diverticula without active inflammatory changes. There is no evidence of bowel obstruction or active inflammation. Normal appendix. Vascular/Lymphatic: Mild aortoiliac atherosclerotic disease. The origins of the celiac axis, SMA, IMA and the renal arteries are patent. Evaluation of the splenic artery is very limited on this study. There is however apparent focal area of wall thickening or plaque or thrombus in the midportion of the splenic artery (series 4, image 19 and coronal series 7, image 46) with associated luminal narrowing. The SMV, splenic vein, and main portal vein are patent as visualized. No portal venous gas. Mildly enlarged retroperitoneal lymph nodes at the level of the bifurcation of the aorta measure up to 10 mm in short axis. Mildly enlarged right common iliac node measures 10 mm in short axis. Reproductive: The uterus is grossly unremarkable. There is a subcentimeter hypodense lesion in the left ovary with possible fat attenuation which may  represent a dermoid. The right ovary is grossly unremarkable. Pelvic ultrasound or MRI may provide better characterization. Other: None Musculoskeletal: No acute or significant osseous findings. IMPRESSION: 1. Large subcapsular splenic infarct. Findings may be related to an embolic process possibly originating from the heart such as infective endocarditis. Clinical correlation is recommended. 2. Focal area of wall apparent thickening or thrombus in the midportion of the splenic artery with associated luminal narrowing. 3. Mildly enlarged retroperitoneal and right common iliac lymph nodes. 4. Sigmoid diverticulosis. No bowel obstruction or active inflammation. Normal appendix. 5. Possible small left ovarian dermoid. Electronically Signed   By: Elgie Collard M.D.   On: 22-Feb-2018 05:42   US Renal  Result Date: 02/05/2018 CLINICAL DATA:  Chronic kidney disease and proteinuria. History of diabetes, hypertension, HIV. EXAM: RENAL / URINARY TRACT ULTRASOUND COMPLETE COMPARISON:  01/15/2018 FINDINGS: Right Kidney: Renal measurements: 10.4 centimeters. Echogenicity within normal limits. No mass or hydronephrosis visualized. Left Kidney: Renal measurements: 10.0 centimeters. A cyst in the UPPER pole region is 1.9 x 1.4 x 1.2 centimeters. Echogenicity is normal. No hydronephrosis. Bladder: The bladder is decompressed. IMPRESSION: Benign LEFT renal cyst. No hydronephrosis. Electronically Signed   By: Norva Pavlov M.D.   On: 02/05/2018 08:28   Ct Chest High Resolution  Result Date: 01/15/2018 CLINICAL DATA:  Increasing shortness of breath on exertion for 2 months. Interstitial lung disease. EXAM: CT CHEST WITHOUT CONTRAST TECHNIQUE: Multidetector CT imaging of the chest was performed following the standard protocol without intravenous contrast. High resolution imaging of the lungs, as well as inspiratory and expiratory imaging, was performed. COMPARISON:  03/03/2017 and 05/28/2016. FINDINGS: Cardiovascular:  Atherosclerotic calcification of the arterial vasculature. Heart is mildly enlarged. No pericardial effusion. Mediastinum/Nodes: Numerous low internal jugular, mediastinal and axillary lymph nodes, as before. Index mediastinal lymph node in the right paratracheal station measures 1.4 cm, stable. Index axillary lymph node on the right measures 1.4 cm, stable. Hilar regions are difficult to evaluate without IV contrast. Esophagus is grossly unremarkable. Lungs/Pleura: Peripheral and basilar predominant pattern of subpleural reticulation, traction bronchiectasis/bronchiolectasis, grossly stable from 05/28/2016. Associated honeycombing. No pleural fluid. Airway is unremarkable. No air trapping. Upper Abdomen: Visualized portions of the liver, adrenal glands, kidneys, spleen, pancreas, stomach and bowel are grossly unremarkable. 10 mm gastrohepatic ligament lymph node, stable. Musculoskeletal: Degenerative changes in the spine. No worrisome lytic or sclerotic lesions. IMPRESSION: 1. 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 Am Rosezetta Schlatter Crit Care Med Vol 198, Iss 5, [phone], Dec 07 2016. 2. Chronic borderline mediastinal and bilateral axillary adenopathy. Difficult to exclude an underlying lymphoproliferative disorder. 3.  Aortic atherosclerosis (ICD10-170.0). Electronically Signed   By: Leanna Battles M.D.   On: 01/15/2018 13:15   Vas [place] Lower Extremity Venous (dvt)  Result Date: 02/09/2018  Lower Venous Study Indications: Splenic infarction.  Performing Technologist: Sherren Kerns RVS  Examination Guidelines: A complete evaluation includes B-mode imaging, spectral Doppler, color Doppler, and power Doppler as needed of all accessible portions of each vessel. Bilateral testing is considered an integral part of a complete examination. Limited examinations for  reoccurring indications may be performed as noted.  Right Venous Findings: +---------+---------------+---------+-----------+----------+-------+          CompressibilityPhasicitySpontaneityPropertiesSummary +---------+---------------+---------+-----------+----------+-------+ CFV      Full           Yes      Yes                          +---------+---------------+---------+-----------+----------+-------+ SFJ      Full                                                 +---------+---------------+---------+-----------+----------+-------+ FV Prox  Full                                                 +---------+---------------+---------+-----------+----------+-------+ FV Mid   Full                                                 +---------+---------------+---------+-----------+----------+-------+ FV DistalFull                                                 +---------+---------------+---------+-----------+----------+-------+ PFV      Full                                                 +---------+---------------+---------+-----------+----------+-------+ POP      Full           Yes      Yes                          +---------+---------------+---------+-----------+----------+-------+ PTV      Full                                                 +---------+---------------+---------+-----------+----------+-------+ PERO     Full                                                 +---------+---------------+---------+-----------+----------+-------+  Left Venous Findings: +---------+---------------+---------+-----------+----------+-------+          CompressibilityPhasicitySpontaneityPropertiesSummary +---------+---------------+---------+-----------+----------+-------+ CFV      Full           Yes      Yes                          +---------+---------------+---------+-----------+----------+-------+ SFJ      Full                                                  +---------+---------------+---------+-----------+----------+-------+ FV Prox  Full                                                 +---------+---------------+---------+-----------+----------+-------+ FV Mid   Full                                                 +---------+---------------+---------+-----------+----------+-------+ FV DistalFull                                                 +---------+---------------+---------+-----------+----------+-------+ PFV      Full                                                 +---------+---------------+---------+-----------+----------+-------+ POP      Full           Yes      Yes                          +---------+---------------+---------+-----------+----------+-------+ PTV      Full                                                 +---------+---------------+---------+-----------+----------+-------+ PERO     Full                                                 +---------+---------------+---------+-----------+----------+-------+    Summary: Right: There is no evidence of deep vein thrombosis in the lower extremity. Left: There is no evidence of deep vein thrombosis in the lower extremity.  *See table(s) above for measurements and observations. Electronically signed by Fabienne Bruns MD on 02/09/2018 at 6:56:55 PM.    Final     Labs:  CBC: Recent Labs    02/19/2018 0527  02/11/18 0228 02/12/18 0622 02/12/18 0951 02/13/18 0518  WBC 3.7*  --  4.7 3.7*  --  3.1*  HGB 7.6*   < > 8.3* 7.0* 7.5* 8.0*  HCT 26.2*   < >  27.7* 23.3* 26.1* 27.5*  PLT 158  --  167 157  --  209   < > = values in this interval not displayed.    COAGS: Recent Labs    02/09/18 0556  INR 1.16    BMP: Recent Labs    02/09/18 0556 02/11/18 0228 02/12/18 0622 02/13/18 0518  NA 133* 135 136 139  K 3.8 4.0 4.4 4.0  CL 108 109 110 113*  CO2 22 19* 20* 23  GLUCOSE 88 107* 77 88  BUN 15 21 27* 23  CALCIUM 7.7* 7.9* 8.0*  8.4*  CREATININE 1.40* 1.71* 2.18* 2.05*  GFRNONAA 38* 30* 22* 24*  GFRAA 44* 35* 26* 28*    LIVER FUNCTION TESTS: Recent Labs    03/03/17 1302 02/20/2018 0416 02/09/18 0556 02/12/18 0622 02/13/18 0518  BILITOT 0.7 0.3 0.5  --   --   AST 22 23 19   --   --   ALT 12* 11 8  --   --   ALKPHOS 59 56 45  --   --   PROT 8.2* 7.8 7.0  --   --   ALBUMIN 3.3* 2.3* 1.6* 1.4* 1.4*    TUMOR MARKERS: No results for input(s): AFPTM, CEA, CA199, CHROMGRNA in the last 8760 hours.  Assessment and Plan:  SLE Mixed connective tissue disease Lupus nephritis Proteinuria Scheduled for random renal biopsy 11/11 in IR Risks and benefits discussed with the patient including, but not limited to bleeding, infection, damage to adjacent structures or low yield requiring additional tests.  All of the patient's questions were answered, patient is agreeable to proceed. Consent signed and in chart.   Thank you for this interesting consult.  I greatly enjoyed meeting Mercedies Ganesh and look forward to participating in their care.  A copy of this report was sent to the requesting provider on this date.  Electronically Signed: Robet Leu, PA-C 02/13/2018, 1:59 PM   I spent a total of 20 Minutes    in face to face in clinical consultation, greater than 50% of which was counseling/coordinating care for random renal bx

## 2018-02-13 NOTE — Progress Notes (Signed)
Lab called regarding questionable heparin level drawn at 5 am. Although heparin was stopped at 8pm last night, level was high. Per pharmacy, no need to redraw.

## 2018-02-13 NOTE — Care Management (Signed)
#  1.  S/W  BARBARA  @ [facility] # [phone]  APIXABAN : NONE FORMULARY ELIQUIS  10 MG BID: NONE FORMULARY  1. ELIAUIS  2.5 MG BID COVER- YES CO-PAY- $  91.30 TIER- NO PRIOR APPROVAL- NO  2. ELIQUIS  5 MG BID COVER- YES CO-PAY- $ 91.30 TIER- NO PRIOR APPROVAL- NO  NO DEDUCTIBLE OUT-OF-POCKET- NOT MET PREFERRED PHARMACY : YES-- [facility]  

## 2018-02-13 NOTE — Discharge Instructions (Signed)
Information on my medicine - ELIQUIS (apixaban)  This medication education was reviewed with me or my healthcare representative as part of my discharge preparation.  The pharmacist that spoke with me during my hospital stay was:  Hana Trippett Kay, RPH  Why was Eliquis prescribed for you? Eliquis was prescribed to treat blood clots that may have been found in the veins of your legs (deep vein thrombosis) or in your lungs (pulmonary embolism) and to reduce the risk of them occurring again.  What do You need to know about Eliquis ? The starting dose is 10 mg (two 5 mg tablets) taken TWICE daily for the FIRST SEVEN (7) DAYS, then  the dose is reduced to ONE 5 mg tablet taken TWICE daily.  Eliquis may be taken with or without food.   Try to take the dose about the same time in the morning and in the evening. If you have difficulty swallowing the tablet whole please discuss with your pharmacist how to take the medication safely.  Take Eliquis exactly as prescribed and DO NOT stop taking Eliquis without talking to the doctor who prescribed the medication.  Stopping may increase your risk of developing a new blood clot.  Refill your prescription before you run out.  After discharge, you should have regular check-up appointments with your healthcare provider that is prescribing your Eliquis.    What do you do if you miss a dose? If a dose of ELIQUIS is not taken at the scheduled time, take it as soon as possible on the same day and twice-daily administration should be resumed. The dose should not be doubled to make up for a missed dose.  Important Safety Information A possible side effect of Eliquis is bleeding. You should call your healthcare provider right away if you experience any of the following: Bleeding from an injury or your nose that does not stop. Unusual colored urine (red or dark brown) or unusual colored stools (red or black). Unusual bruising for unknown reasons. A serious  fall or if you hit your head (even if there is no bleeding).  Some medicines may interact with Eliquis and might increase your risk of bleeding or clotting while on Eliquis. To help avoid this, consult your healthcare provider or pharmacist prior to using any new prescription or non-prescription medications, including herbals, vitamins, non-steroidal anti-inflammatory drugs (NSAIDs) and supplements.  This website has more information on Eliquis (apixaban): http://www.eliquis.com/eliquis/home  

## 2018-02-13 NOTE — Progress Notes (Signed)
Benefit check in progress for Eliquis; B Gunnison Chahal RN,MHA,BSN [phone] 

## 2018-02-13 NOTE — Progress Notes (Signed)
[facility] for Apixaban (from heparin) Indication: splenic infarct  Allergies  Allergen Reactions  . Lyrica [Pregabalin] Swelling    Pedal edema     Assessment: 76 YOF who presented on 11/3 with L-flank pain and was found to have a splenic infarct - currently evaluating for source. Pharmacy consulted to dose apixaban (transition from heparin) for anticoagulation.   Eliquis discharge kit to be provided from [facility] for discharge Eliquis education complete  Goal of Therapy:  Anticoagulation Monitor platelets by anticoagulation protocol: Yes   Plan:  Continue current Eliquis dose  Thank you Anette Guarneri, PharmD [phone] Please utilize Amion (under [facility]) for appropriate number for your unit pharmacist. 02/13/2018 10:35 AM

## 2018-02-13 NOTE — Progress Notes (Addendum)
[facility] PROGRESS NOTE  Madeline Harper ZOX:[id number] DOB: Dec 15, 1950 DOA: 03-04-18 PCP: Pearline Cables, MD   Narrative: 67 year old female Features of lupus, mixed connective tissue disorder diagnosed 01/2016 and on Plaquenil since then with a break in 06/2016-on Plaquenil 300 daily and Imuran (Imuran was recommended for UIP) Prior history of STDs in the distant past Admitted on 02/09/2017 acute splenic infarct?  Vasculitis Needed IV pain meds on admission   A & Plan Splenic infarct-Infectious? 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 Victorino Dike of rheumatology who she sees-they may be able to offer further guidance  Throat pain-add viscous lidocaine--will discontinue a.m. 11/9 if completely better  Fever-likely secondary to splenic infarct-lactic acid is low and procalcitonin is low not suggestive of infection agree with holding antibiotics  AKI-not improved-possble Pre-renal ATN based on FeNa 0.4-ACe started as outpatient by nephrologist for proteinuria recently and now held/off  Naprosyn-previously on IV fluid discontinued 11/7  Iron deficiency anemia-iron 10, saturation ratios 9 given IV iron-cont Ferrous sulph  Mild obesity BMI 26   DVT prophylaxis: On IV heparin code Status: Full code family Communication: Discussed with daughter disposition Plan: Inpatient   Madeline Uliano, MD  [facility] Direct contact: [phone] --Via amion app OR  --[email or web address]; password [email or web address]  7PM-7AM contact night coverage as above 02/13/2018,  9:34 AM  LOS: 4 days   Consultants:  No  Procedures:  No  Antimicrobials:  No  Interval history/Subjective: Somewhat upset at multiple [facility] Reassured that these things are necessary No chest pain Abdominal pain is better No cough No burning in the chest Positive stool this morning   Objective:  Vitals:  Vitals:   02/13/18 0500 02/13/18 0817  BP: 130/76 128/69  Pulse: 89 80  Resp: 18 18  Temp: 99.1 F (37.3 C) 98.7 F (37.1 C)  SpO2: 95%     Exam:  EOMI NCAT Throat clear poor dentition No submandibular lymphadenopathy no thyromegaly Chest is clear with no added sounds Abdomen is soft nontender nondistended no rebound no guarding No lower extremity edema Power is 5/5   I have personally reviewed the following:   Labs:  Creatinine of 2.1 peak-->2.05 up from 1.05 from admission  Hemoglobin stable in the 8 range no white count although slight drop 3.1 platelets  Imaging studies:  Done on admission 11/3 Echocardiogram/TEE performed 02/23/2018 showedStudy Conclusions  - Left ventricle: Systolic function was normal. The estimated   ejection fraction was in the range of 55% to 60%. Wall motion was   normal; there were no regional wall motion abnormalities. - Mitral valve: There was trivial regurgitation. - Left atrium: No evidence of thrombus in the atrial cavity or   appendage. No evidence of thrombus in the atrial cavity or   appendage. - Right atrium: No evidence of thrombus in the atrial cavity or   appendage. - Atrial septum: A patent foramen ovale cannot be excluded. No flow   noted on color flow Doppler. Saline microcavitation study was   initially negative but there were a few bubble with the   application of abdominal pressure. Cannot rule out small PFO.    Medical  tests:  n   Test discussed with performing physician:  n  Decision to obtain old records:  n  Review and summation of old records:  y  Scheduled Meds: .  apixaban  10 mg Oral BID   Followed by  . [START ON 02/19/2018] apixaban  5 mg Oral BID  . azaTHIOprine  100 mg Oral Daily  . docusate sodium  100 mg Oral BID  . ferrous sulfate  325 mg Oral BID WC  . hydroxychloroquine  300 mg Oral Daily  . polyethylene glycol  17 g Oral Daily  . 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

## 2018-02-13 NOTE — Progress Notes (Signed)
Physical Therapy Treatment Patient Details Name: Madeline Harper MRN: [id number] DOB: 1950-08-05 Today's Date: 02/13/2018    History of Present Illness Pt is a 67 y/o female with a PMH significant for Lupus, cataracts, cervical DDD. She presented with complaints of L flank pain that started saturday morning 11/2, and was found to have a splenic infarct.     PT Comments    Pt progressing well with mobility. She ambulated 200 feet without AD supervision. Husband present during session. PT to continue per POC.   Follow Up Recommendations  No PT follow up;Supervision - Intermittent     Equipment Recommendations  None recommended by PT    Recommendations for Other Services       Precautions / Restrictions Precautions Precautions: Fall    Mobility  Bed Mobility Overal bed mobility: Modified Independent                Transfers Overall transfer level: Needs assistance Equipment used: None Transfers: Sit to/from Stand Sit to Stand: Supervision         General transfer comment: supervision for safety due to pt lightheaded  Ambulation/Gait Ambulation/Gait assistance: Supervision Gait Distance (Feet): 200 Feet Assistive device: None Gait Pattern/deviations: Step-through pattern;Decreased stride length Gait velocity: Decreased Gait velocity interpretation: 1.31 - 2.62 ft/sec, indicative of limited community ambulator General Gait Details: slow, steady gait; max HR 104   Stairs             Wheelchair Mobility    Modified Rankin (Stroke Patients Only)       Balance   Sitting-balance support: Feet supported;No upper extremity supported Sitting balance-Leahy Scale: Good     Standing balance support: No upper extremity supported;During functional activity Standing balance-Leahy Scale: Fair Standing balance comment: steady gait without AD, unable to except challenges                            Cognition Arousal/Alertness:  Awake/alert Behavior During Therapy: WFL for tasks assessed/performed Overall Cognitive Status: Within Functional Limits for tasks assessed                                        Exercises      General Comments        Pertinent Vitals/Pain Pain Assessment: No/denies pain    Home Living                      Prior Function            PT Goals (current goals can now be found in the care plan section) Acute Rehab PT Goals Patient Stated Goal: get home PT Goal Formulation: With patient Time For Goal Achievement: 02/16/18 Potential to Achieve Goals: Good Progress towards PT goals: Progressing toward goals    Frequency    Min 3X/week      PT Plan Current plan remains appropriate    Co-evaluation              AM-PAC PT "6 Clicks" Daily Activity  Outcome Measure  Difficulty turning over in bed (including adjusting bedclothes, sheets and blankets)?: None Difficulty moving from lying on back to sitting on the side of the bed? : None Difficulty sitting down on and standing up from a chair with arms (e.g., wheelchair, bedside commode, etc,.)?: None Help needed moving to and from a bed to  chair (including a wheelchair)?: None Help needed walking in hospital room?: A Little Help needed climbing 3-5 steps with a railing? : A Little 6 Click Score: 22    End of Session Equipment Utilized During Treatment: Gait belt Activity Tolerance: Patient tolerated treatment well Patient left: with call bell/phone within reach;in bed;with family/visitor present Nurse Communication: Mobility status PT Visit Diagnosis: Unsteadiness on feet (R26.81);Difficulty in walking, not elsewhere classified (R26.2)     Time: 1012-1021 PT Time Calculation (min) (ACUTE ONLY): 9 min  Charges:  $Gait Training: 8-22 mins                     Aida Raider, PT  Office # [phone] Pager [phone]    Ilda Foil 02/13/2018, 11:09 AM

## 2018-02-13 NOTE — Consult Note (Addendum)
[facility]phrology Consultation Note  Requesting MD: Dr. Verlon Au Reason for consult: AKI  HPI:  Madeline Harper is a 67 y.o. female with history of mixed connective tissue disease, SLE diagnosed in 2017, interstitial lung disease undergoing evaluation with pulmonologist, proteinuria, presented with left flank pain found to have acute splenic infarct.  Patient was treated with anticoagulation and pain medication.  TEE was done to rule out any thromboembolism.  On admission patient had serum creatinine level of 1.05.  She got IV contrast during CT chest and abdomen pelvis.  The serum creatinine level started going up to 2.05 today.  Patient had urine output of around 2 L in 24 hours.  The urinalysis done during admission with 100 protein but no RBC WBC. Patient denied use of NSAIDs.  She is one of my clinic patients whom I saw first time on 10/18 for the evaluation of proteinuria.  She was referred by her rheumatologist.  The ultrasound kidneys showed good size kidneys, left renal cyst and no hydronephrosis.  I was planning for kidney biopsy for the evaluation of lupus nephritis.  The outpatient labs were remarkable for low complement, high anti-Smith antibody, high Sjogren anti-SSA, ANA +1:1280, speckled. 24-hour urine protein 2.5 g creatinine 1.02, albumin 2.9 in 01/2018.  The lisinopril was held. She is on Plaquenil and Imuran for lupus. Today, patient reported feeling good.  She denies headache, dizziness, nausea vomiting chest pain shortness of breath.  She has external Foley catheter.  Her husband at bedside.   Creatinine  Date/Time Value Ref Range Status  12/20/2013 0.8 0.5 - 1.1 mg/dL Final   Creat  Date/Time Value Ref Range Status  11/18/2016 01:50 PM 0.90 0.50 - 0.99 mg/dL Final    Comment:      For patients > or = 66 years of age: The upper reference limit for Creatinine is approximately 13% higher for people identified as African-American.     09/18/2016  12:10 PM 0.96 0.50 - 0.99 mg/dL Final    Comment:      For patients > or = 66 years of age: The upper reference limit for Creatinine is approximately 13% higher for people identified as African-American.     09/04/2016 02:59 PM 0.89 0.50 - 0.99 mg/dL Final    Comment:      For patients > or = 66 years of age: The upper reference limit for Creatinine is approximately 13% higher for people identified as African-American.     08/19/2016 12:02 PM 0.94 0.50 - 0.99 mg/dL Final    Comment:      For patients > or = 66 years of age: The upper reference limit for Creatinine is approximately 13% higher for people identified as African-American.     08/05/2016 10:53 AM 0.91 0.50 - 0.99 mg/dL Final    Comment:      For patients > or = 66 years of age: The upper reference limit for Creatinine is approximately 13% higher for people identified as African-American.     06/05/2016 12:30 PM CANCELED 0.50 - 0.99 mg/dL     Comment:    Result canceled by the ancillary  06/05/2016 12:29 PM 0.91 0.50 - 0.99 mg/dL Final    Comment:      For patients > or = 66 years of age: The upper reference limit for Creatinine is approximately 13% higher for people identified as African-American.     02/28/2016 09:59 AM 0.75 0.50 - 0.99 mg/dL Final    Comment:  For patients > or = 66 years of age: The upper reference limit for Creatinine is approximately 13% higher for people identified as African-American.      Creatinine, Ser  Date/Time Value Ref Range Status  02/13/2018 05:18 AM 2.05 (H) 0.44 - 1.00 mg/dL Final  02/12/2018 06:22 AM 2.18 (H) 0.44 - 1.00 mg/dL Final  02/11/2018 02:28 AM 1.71 (H) 0.44 - 1.00 mg/dL Final  02/09/2018 05:56 AM 1.40 (H) 0.44 - 1.00 mg/dL Final  02/09/2018 04:16 AM 1.05 (H) 0.44 - 1.00 mg/dL Final  03/05/2017 12:08 PM 0.89 0.40 - 1.20 mg/dL Final  03/03/2017 01:02 PM 0.82 0.44 - 1.00 mg/dL Final     PMHx:   Past Medical History:  Diagnosis Date  . Anemia    . Autoimmune disease ([place])   . Bone spur of foot   . Cataract   . DDD (degenerative disc disease), cervical 01/26/2017  . History of kidney stones   . Proteinuria 02/09/2018   Pt of [place] kidney.  ?lupus nephritis   . Splenic infarct     Past Surgical History:  Procedure Laterality Date  . CATARACT EXTRACTION, BILATERAL    . FOOT SURGERY    . [place]  . TEE WITHOUT CARDIOVERSION N/A 02/18/2018   Procedure: TRANSESOPHAGEAL ECHOCARDIOGRAM (TEE);  Surgeon: Skeet Latch, MD;  Location: [facility] ENDOSCOPY;  Service: Cardiovascular;  Laterality: N/A;  . TUBAL LIGATION  1970    Family Hx:  Family History  Problem Relation Age of Onset  . Diabetes Mother   . Heart disease Mother   . Kidney failure Mother   . Cancer Mother        Uterine Cancer  . Diabetes Maternal Grandmother   . Kidney failure Maternal Grandmother   . Stroke Sister   . Gout Maternal Grandfather   . Breast cancer Neg Hx     Social History:  reports that she quit smoking about 37 years ago. Her smoking use included cigarettes. She has a 30.00 pack-year smoking history. She has never used smokeless tobacco. She reports that she drinks alcohol. She reports that she does not use drugs.  Allergies:  Allergies  Allergen Reactions  . Lyrica [Pregabalin] Swelling    Pedal edema     Medications: Prior to Admission medications   Medication Sig Start Date End Date Taking? Authorizing Provider  acetaminophen (TYLENOL 8 HOUR ARTHRITIS PAIN) 650 MG CR tablet Take 650 mg by mouth every 8 (eight) hours as needed for pain.   Yes [provider]  azaTHIOprine (IMURAN) 50 MG tablet Take 2 tablets (100 mg total) by mouth daily. 11/05/17  Yes Copland, Gay Filler, MD  calcium carbonate (CALCIUM 600) 600 MG TABS tablet Take 600 mg by mouth daily.   Yes [provider]  Ergocalciferol (VITAMIN D2 PO) Take 2,000 mg by mouth daily.    Yes [provider]  hydroxychloroquine (PLAQUENIL)  200 MG tablet Take 300 mg by mouth daily.  12/11/16  Yes [provider]  lisinopril (PRINIVIL,ZESTRIL) 5 MG tablet Take 5 mg by mouth daily. 01/30/18  Yes [provider]  Multiple Vitamins-Minerals (MULTI ADULT GUMMIES PO) Take 1 tablet by mouth daily.   Yes [provider]  Pyridoxine HCl (VITAMIN B-6) 500 MG tablet Take 500 mg by mouth daily.   Yes [provider]  vitamin B-12 (CYANOCOBALAMIN) 1000 MCG tablet Take 1,000 mcg by mouth daily.   Yes [provider]  ALPRAZolam Duanne Moron) 0.25 MG tablet Take up to  bid as needed for flying Patient not taking: Reported on 01/07/2018 11/05/17   Copland, Gay Filler, MD  ELIQUIS STARTER PACK (ELIQUIS STARTER PACK) 5 MG TABS Take as directed on package: start with two-'5mg'$  tablets twice daily for 7 days. On day 8, switch to one-'5mg'$  tablet twice daily. 02/13/18   Nita Sells, MD  naproxen (NAPROSYN) 500 MG tablet Take 1 tablet (500 mg total) by mouth 2 (two) times daily. Limit [place] to 3-5 days Patient not taking: Reported on 02/26/2018 05/19/17   Horton, Barbette Hair, MD    I have reviewed the patient's current medications.  Labs:  Results for orders placed or performed during the hospital encounter of 02/07/2018 (from the past 48 hour(s))  Heparin level (unfractionated)     Status: None   Collection Time: 02/12/18  6:22 AM  Result Value Ref Range   Heparin Unfractionated 0.52 0.30 - 0.70 IU/mL    Comment: (NOTE) If heparin results are below expected values, and patient dosage has  been confirmed, suggest follow up testing of antithrombin III levels. Performed at [facility], [place] [address], [place], [place] [address]   CBC     Status: Abnormal   Collection Time: 02/12/18  6:22 AM  Result Value Ref Range   WBC 3.7 (L) 4.0 - 10.5 K/uL   RBC 2.56 (L) 3.87 - 5.11 MIL/uL   Hemoglobin 7.0 (L) 12.0 - 15.0 g/dL   HCT 23.3 (L) 36.0 - 46.0 %   MCV 91.0 80.0 - 100.0 fL   MCH 27.3 26.0 - 34.0 pg   MCHC 30.0  30.0 - 36.0 g/dL   RDW 15.3 11.5 - 15.5 %   Platelets 157 150 - 400 K/uL   nRBC 0.0 0.0 - 0.2 %    Comment: Performed at [facility], [place] [address], [place], [place] [address]  Renal function panel     Status: Abnormal   Collection Time: 02/12/18  6:22 AM  Result Value Ref Range   Sodium 136 135 - 145 mmol/L   Potassium 4.4 3.5 - 5.1 mmol/L   Chloride 110 98 - 111 mmol/L   CO2 20 (L) 22 - 32 mmol/L   Glucose, Bld 77 70 - 99 mg/dL   BUN 27 (H) 8 - 23 mg/dL   Creatinine, Ser 2.18 (H) 0.44 - 1.00 mg/dL   Calcium 8.0 (L) 8.9 - 10.3 mg/dL   Phosphorus 3.9 2.5 - 4.6 mg/dL   Albumin 1.4 (L) 3.5 - 5.0 g/dL   GFR calc non Af Amer 22 (L) >60 mL/min   GFR calc Af Amer 26 (L) >60 mL/min    Comment: (NOTE) The eGFR has been calculated using the CKD EPI equation. This calculation has not been validated in all clinical situations. eGFR's persistently <60 mL/min signify possible Chronic Kidney Disease.    Anion gap 6 5 - 15    Comment: Performed at [facility] [address] [place], [place] [address]  Creatinine, urine, random     Status: None   Collection Time: 02/12/18  9:17 AM  Result Value Ref Range   Creatinine, Urine 146.33 mg/dL    Comment: Performed at [facility] [address], [place], [place] [address]  Sodium, urine, random     Status: None   Collection Time: 02/12/18  9:17 AM  Result Value Ref Range   Sodium, Ur 35 mmol/L    Comment: Performed at [facility] [place],  [place] [address]  Hemoglobin and hematocrit, blood     Status: Abnormal   Collection Time: 02/12/18  9:51 AM  Result Value Ref Range   Hemoglobin 7.5 (L) 12.0 - 15.0 g/dL   HCT 26.1 (L) 36.0 - 46.0 %    Comment: Performed at [facility], [place] [address], [place], [place] [address]  CBC     Status: Abnormal   Collection Time: 02/13/18  5:18 AM  Result Value Ref Range   WBC 3.1 (L) 4.0 - 10.5 K/uL   RBC 3.03 (L) 3.87 - 5.11 MIL/uL   Hemoglobin 8.0 (L)  12.0 - 15.0 g/dL   HCT 27.5 (L) 36.0 - 46.0 %   MCV 90.8 80.0 - 100.0 fL   MCH 26.4 26.0 - 34.0 pg   MCHC 29.1 (L) 30.0 - 36.0 g/dL   RDW 15.1 11.5 - 15.5 %   Platelets 209 150 - 400 K/uL   nRBC 0.0 0.0 - 0.2 %    Comment: Performed at [facility], [place] [address], [place], [place] [address]  Uric acid     Status: None   Collection Time: 02/13/18  5:18 AM  Result Value Ref Range   Uric Acid, Serum 6.6 2.5 - 7.1 mg/dL    Comment: Performed at [facility] [address] [place], [place] [address]  Renal function panel     Status: Abnormal   Collection Time: 02/13/18  5:18 AM  Result Value Ref Range   Sodium 139 135 - 145 mmol/L   Potassium 4.0 3.5 - 5.1 mmol/L   Chloride 113 (H) 98 - 111 mmol/L   CO2 23 22 - 32 mmol/L   Glucose, Bld 88 70 - 99 mg/dL   BUN 23 8 - 23 mg/dL   Creatinine, Ser 2.05 (H) 0.44 - 1.00 mg/dL   Calcium 8.4 (L) 8.9 - 10.3 mg/dL   Phosphorus 4.1 2.5 - 4.6 mg/dL   Albumin 1.4 (L) 3.5 - 5.0 g/dL   GFR calc non Af Amer 24 (L) >60 mL/min   GFR calc Af Amer 28 (L) >60 mL/min    Comment: (NOTE) The eGFR has been calculated using the CKD EPI equation. This calculation has not been validated in all clinical situations. eGFR's persistently <60 mL/min signify possible Chronic Kidney Disease.    Anion gap 3 (L) 5 - 15    Comment: Performed at [facility], [place] [address], [place], [place] [address]     ROS:  Pertinent items noted in HPI and remainder of comprehensive ROS otherwise negative.  Physical Exam: Vitals:   02/13/18 0500 02/13/18 0817  BP: 130/76 128/69  Pulse: 89 80  Resp: 18 18  Temp: 99.1 F (37.3 C) 98.7 F (37.1 C)  SpO2: 95%      General exam: Appears calm and comfortable  Respiratory system: Bilateral diffuse fine crackles, respiratory effort normal, no wheezing Cardiovascular system: S1 & S2 heard, RRR.  No pedal edema. Gastrointestinal system: Abdomen is nondistended, soft and nontender. Normal bowel sounds  heard. Central nervous system: Alert and oriented. No focal neurological deficits. Extremities: Symmetric 5 x 5 power. Skin: No rashes, lesions or ulcers Psychiatry: Judgement and insight appear normal. Mood & affect appropriate.   Assessment/Plan:  #Acute kidney injury due to contrast nephropathy and hemodynamic instability given tachycardia: Patient received IV contrast on 11/3.  The serum creatinine level is gradually increasing from 1.05 on admission to 2.05 today.  She is nonoliguric.  The  urinalysis shows chronic proteinuria but no RBC or WBC.  The volume status is okay. -I will start gentle IV hydration today, monitor BMP, avoid nephrotoxins.  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  Patient received dose of Eliquis yesterday and this morning.  I have discussed with the patient, primary team and interventional radiology.  Hold Eliquis from today through the weekend.  IR consulted per kidney biopsy on Monday.  Repeating urine protein creatinine ratio.  Lisinopril is on hold because of acute kidney injury.  #Acute splenic infarction: Echo was done.  Currently asymptomatic.  Holding anticoagulation through the weekend for possible biopsy on Monday.  She will need anticoagulation after that per primary team.  #History of lupus and mixed connective tissue disease: Currently on Plaquenil and Imuran.  She follows with her rheumatologist and pulmonologist.  Thank you very much for the consult.  I will continue to follow with you.  Jonanthony Nahar Tanna Furry 02/13/2018, 1:27 PM  [facility].

## 2018-02-14 LAB — CBC WITH DIFFERENTIAL/PLATELET
ABS IMMATURE GRANULOCYTES: 0.03 10*3/uL (ref 0.00–0.07)
BASOS ABS: 0 10*3/uL (ref 0.0–0.1)
Basophils Relative: 0 %
Eosinophils Absolute: 0 10*3/uL (ref 0.0–0.5)
Eosinophils Relative: 1 %
HCT: 27.5 % — ABNORMAL LOW (ref 36.0–46.0)
HEMOGLOBIN: 7.9 g/dL — AB (ref 12.0–15.0)
Immature Granulocytes: 1 %
LYMPHS ABS: 0.5 10*3/uL — AB (ref 0.7–4.0)
LYMPHS PCT: 16 %
MCH: 26.1 pg (ref 26.0–34.0)
MCHC: 28.7 g/dL — ABNORMAL LOW (ref 30.0–36.0)
MCV: 90.8 fL (ref 80.0–100.0)
MONO ABS: 0.3 10*3/uL (ref 0.1–1.0)
Monocytes Relative: 9 %
NEUTROS ABS: 2.3 10*3/uL (ref 1.7–7.7)
NRBC: 0 % (ref 0.0–0.2)
Neutrophils Relative %: 73 %
Platelets: 224 10*3/uL (ref 150–400)
RBC: 3.03 MIL/uL — AB (ref 3.87–5.11)
RDW: 15 % (ref 11.5–15.5)
WBC: 3.1 10*3/uL — AB (ref 4.0–10.5)

## 2018-02-14 LAB — PROTEIN / CREATININE RATIO, URINE
Creatinine, Urine: 39.87 mg/dL
Protein Creatinine Ratio: 2.31 mg/mg{Cre} — ABNORMAL HIGH (ref 0.00–0.15)
Total Protein, Urine: 92 mg/dL

## 2018-02-14 LAB — BASIC METABOLIC PANEL
Anion gap: 7 (ref 5–15)
BUN: 20 mg/dL (ref 8–23)
CHLORIDE: 114 mmol/L — AB (ref 98–111)
CO2: 20 mmol/L — ABNORMAL LOW (ref 22–32)
Calcium: 8 mg/dL — ABNORMAL LOW (ref 8.9–10.3)
Creatinine, Ser: 1.71 mg/dL — ABNORMAL HIGH (ref 0.44–1.00)
GFR calc non Af Amer: 30 mL/min — ABNORMAL LOW (ref >60)
GFR, EST AFRICAN AMERICAN: 35 mL/min — AB (ref >60)
Glucose, Bld: 87 mg/dL (ref 70–99)
POTASSIUM: 3.8 mmol/L (ref 3.5–5.1)
SODIUM: 141 mmol/L (ref 135–145)

## 2018-02-14 LAB — PROTEIN C, TOTAL: Protein C, Total: 89 % (ref 60–150)

## 2018-02-14 LAB — HAPTOGLOBIN: HAPTOGLOBIN: 329 mg/dL — AB (ref 34–200)

## 2018-02-14 LAB — PROTEIN S, TOTAL: PROTEIN S AG TOTAL: 96 % (ref 60–150)

## 2018-02-14 MED ORDER — HEPARIN (PORCINE) 25000 UT/250ML-% IV SOLN
1200.0000 [IU]/h | INTRAVENOUS | Status: DC
  Administered 2018-02-15: 1200 [IU]/h via INTRAVENOUS
  Administered 2018-02-15: 1100 [IU]/h via INTRAVENOUS
  Filled 2018-02-14 (×2): qty 250

## 2018-02-14 NOTE — Progress Notes (Signed)
ANTICOAGULATION CONSULT NOTE Pharmacy Consult for Apixaban back to heparin Indication: splenic infarct  Allergies  Allergen Reactions  . Lyrica [Pregabalin] Swelling    Pedal edema     Assessment: 21 YOF who presented on 11/3 with L-flank pain and was found to have a splenic infarct - currently evaluating for source.  Transitioning back to heparin for planned kidney biopsy to begin 11/10  Goal of Therapy:  PTT = 66 to 102 seconds Heparin level = 0.3 to 0.7 Monitor platelets by anticoagulation protocol: Yes   Plan:  Heparin at 1100 units / hr starting at 6 am 11/10 Heparin level / PTT  8 hours after heparin starts Daily heparin level, CBC, PTT  Thank you Okey Regal, PharmD [phone] Please utilize Amion (under [facility]) for appropriate number for your unit pharmacist. 02/14/2018 11:07 AM

## 2018-02-14 NOTE — Progress Notes (Signed)
[facility] PROGRESS NOTE  Akhila Mahnken ZOX:[id number] DOB: 1950-12-25 DOA: March 02, 2018 PCP: Pearline Cables, MD   Narrative: 67 year old female Features of lupus, mixed connective tissue disorder diagnosed 01/2016 and on Plaquenil since then with a break in 06/2016-on Plaquenil 300 daily and Imuran (Imuran was recommended for UIP) Prior history of STDs in the distant past Admitted on 02/09/2017 acute splenic infarct?  Vasculitis-infectious disease was consulted and signed off as it was felt after initial work-up with negative TEE and evidence of abscess that this was not infectious in nature Needed IV pain meds on admission  Had a slight rising creatinine during hospital stay and as a result nephrology was consulted-appears to have been seen by nephrology in the outpatient setting and work-up was initiated because of proteinuria and risk of lupus nephritis  Nephrology consulted to help delineate further planning and renal biopsy is being planned for 11/11  A & Plan Splenic infarct-Infectious? Vasculitic?-ECHO as part of work-up no specific findings currently- antiphospholipid panel, lupus anticoagulant cardiolipin antibodies and beta-2 glycoprotein this time are all negative -added LDH, Uric acid, smear and haptoglobin are so far negative Await protein c /s to rule out other causes BC x 2 are neg Pain is currently controlled meds have been discontinued with the same  Mixed connective tissue disorder with features of lupus-we will need outpatient follow-up with Dr. Victorino Dike of rheumatology who she sees-they may be able to offer further guidance  Throat pain-add viscous lidocaine-to you on 11/9  Fever-likely secondary to splenic infarct-lactic acid is low and procalcitonin is low not suggestive of infection agree with holding antibiotics  AKI-renal ATN versus contrast nephropathy because had CT with contrast - FeNa 0.4-ACE discontinued, Naprosyn discontinued Nephrology saw the  patient adjusted fluids to 75 cc/H  Iron deficiency anemia-iron 10, saturation ratios 9 given IV iron-cont Ferrous sulph  Mild obesity BMI 26   DVT prophylaxis: On IV heparin code Status: Full code family Communication: Discussed with daughter disposition Plan: Inpatient awaiting renal biopsy planned for 02/16/2018 and if all stable may be home after the   Milfay, MD  [facility] Direct contact: [phone] --Via amion app OR  --[email or web address]; password [email or web address]  7PM-7AM contact night coverage as above 02/14/2018, 9:40 AM  LOS: 5 days   Consultants:  No  Procedures:  No  Antimicrobials:  No  Interval history/Subjective:  Awake alert no new distress No pain and tolerating diet without any dysphagia or other issues at this time No fever other than low-grade's No cough no cold Passing good urine  Objective:  Vitals:  Vitals:   02/14/18 0331 02/14/18 0935  BP: 139/68 (!) 163/69  Pulse: 89 90  Resp: 18 16  Temp: 99.1 F (37.3 C) 98.3 F (36.8 C)  SpO2: 97% 98%    Exam:  EOMI NCAT Throat clear S1-S2 no murmur Abdomen soft nontender nondistended no rebound no guarding Neurologically intact   I have personally reviewed the following:   Labs:  Creatinine of 2.1 peak--> currently 20/1.7  Hemoglobin 8.0-> 7.9 no white count although slight drop 3.1 platelets  Lupus anticoagulant 34.1, DRV VT 31.4 with no lupus anticoagulate detected  Beta-2 glycoprotein less than 9  Anticardiolipin IgG IgM and IgA less than 9  Pathologist smear review showed normocytic anemia  haptoGlobin was 329 slightly elevated  Uric acid 6.6  Imaging studies:  Done on admission 11/3 Echocardiogram/TEE performed 02/11/2018 showedStudy Conclusions  - Left ventricle: Systolic function was normal. The estimated   ejection fraction was in  the range of 55% to 60%. Wall motion was   normal; there were no regional wall motion abnormalities. - Mitral valve: There was trivial  regurgitation. - Left atrium: No evidence of thrombus in the atrial cavity or   appendage. No evidence of thrombus in the atrial cavity or   appendage. - Right atrium: No evidence of thrombus in the atrial cavity or   appendage. - Atrial septum: A patent foramen ovale cannot be excluded. No flow   noted on color flow Doppler. Saline microcavitation study was   initially negative but there were a few bubble with the   application of abdominal pressure. Cannot rule out small PFO.    Medical tests:  n   Test discussed with performing physician:  n  Decision to obtain old records:  n  Review and summation of old records:  y  Scheduled Meds: . azaTHIOprine  100 mg Oral Daily  . docusate sodium  100 mg Oral BID  . ferrous sulfate  325 mg Oral BID WC  . hydroxychloroquine  300 mg Oral Daily  . polyethylene glycol  17 g Oral Daily  . sodium chloride flush  3 mL Intravenous Q12H   Continuous Infusions: . 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

## 2018-02-14 NOTE — Progress Notes (Signed)
[facility]PHROLOG[facility] PROGRESS NOTE  Assessment/ Plan: Pt is a 67 y.o. yo female with history of mixed connective tissue disease,SLE diagnosed in 2017, interstitial lung disease undergoing evaluation with pulmonologist, proteinuria, presented with left flank pain found to have acute splenic infarct.  Started on anticoagulation.  TEE with no thrombus.  On admission creatinine was 1.05.  She had IV contrast and serum creatinine level peaked to 2.05.  She is my clinic patient whom I saw once for proteinuria and planned for kidney biopsy.  The outpatient labs were remarkable for low complement, high anti-Smith antibody, high Sjogren anti-SSA, ANA +1:1280, speckled. 24-hour urine protein 2.5 g creatinine 1.02, albumin 2.9 in 01/2018.  #Acute kidney injury due to contrast nephropathy and hemodynamic mediated: Patient received IV contrast on 11/3.  The serum creatinine level iwas1.05, peaked to 2.05 on11/8.  Started on gentle IV hydration.  Serum creatinine level trending down to 1.7 today.  She is nonoliguric.  The urinalysis shows chronic proteinuria but no RBC or WBC. The [place] with normal size kidney, no hydronephrosis and benign left renal cyst. The volume status is okay. -I discontinued IV fluid.  Monitor BMP.  Encourage oral intake.  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  Plan for kidney biopsy on Monday by IR.  Eliquis on hold now.  - Repeating urine protein creatinine ratio.  Lisinopril is on hold because of acute kidney injury.  #Acute splenic infarction: Echo was done.  Currently asymptomatic.    IV heparin for systemic anticoagulation, need to hold before biopsy.  Discussed with primary team.  #History of lupus and mixed connective tissue disease: Currently on Plaquenil and Imuran.  She follows with her rheumatologist and pulmonologist.   Subjective: Seen and examined at bedside.  Reported feeling much better.  Denies headache, dizziness, nausea, vomiting, chest pain,  shortness of breath.  Patient's daughter at bedside.  Understand the risk and benefit of kidney biopsy. Objective Vital signs in last 24 hours: Vitals:   02/13/18 0817 02/13/18 1944 02/14/18 0331 02/14/18 0935  BP: 128/69 139/67 139/68 (!) 163/69  Pulse: 80 86 89 90  Resp: 18 16 18 16   Temp: 98.7 F (37.1 C) 98.4 F (36.9 C) 99.1 F (37.3 C) 98.3 F (36.8 C)  TempSrc: Oral Oral Oral Oral  SpO2:  97% 97% 98%  Weight:      Height:       Weight change:   Intake/Output Summary (Last 24 hours) at 02/14/2018 1056 Last data filed at 02/14/2018 0900 Gross per 24 hour  Intake 2233.45 ml  Output 925 ml  Net 1308.45 ml       Labs: Basic Metabolic Panel: Recent Labs  Lab 02/12/18 0622 02/13/18 0518 02/14/18 0525  NA 136 139 141  K 4.4 4.0 3.8  CL 110 113* 114*  CO2 20* 23 20*  GLUCOSE 77 88 87  BUN 27* 23 20  CREATININE 2.18* 2.05* 1.71*  CALCIUM 8.0* 8.4* 8.0*  PHOS 3.9 4.1  --    Liver Function Tests: Recent Labs  Lab 02/23/2018 0416 02/09/18 0556 02/12/18 0622 02/13/18 0518  AST 23 19  --   --   ALT 11 8  --   --   ALKPHOS 56 45  --   --   BILITOT 0.3 0.5  --   --   PROT 7.8 7.0  --   --   ALBUMIN 2.3* 1.6* 1.4* 1.4*   Recent Labs  Lab 02/17/2018 0416  LIPASE 27   No results  for input(s): AMMONIA in the last 168 hours. CBC: Recent Labs  Lab 03/05/2018 0416  03/04/2018 0527  02/11/18 0228 02/12/18 0622 02/12/18 0951 02/13/18 0518 02/14/18 0525  WBC 3.8*   < > 3.7*  --  4.7 3.7*  --  3.1* 3.1*  NEUTROABS 3.0  --   --   --   --   --   --   --  2.3  HGB 9.4*   < > 7.6*   < > 8.3* 7.0* 7.5* 8.0* 7.9*  HCT 31.7*   < > 26.2*   < > 27.7* 23.3* 26.1* 27.5* 27.5*  MCV 92.7   < > 90.7  --  90.2 91.0  --  90.8 90.8  PLT 159   < > 158  --  167 157  --  209 224   < > = values in this interval not displayed.   Cardiac Enzymes: Recent Labs  Lab 02/09/2018 0416 02/20/2018 1419 03/05/2018 1905 02/11/18 0228  TROPONINI <0.03 <0.03 <0.03 <0.03   CBG: Recent Labs   Lab 02/11/2018 1229 03/02/2018 1310 03/01/2018 1719 02/22/2018 1758 02/12/2018 1833  GLUCAP 55* 84 36* 84 97    Iron Studies: No results for input(s): IRON, TIBC, TRANSFERRIN, FERRITIN in the last 72 hours. Studies/Results: No results found.  Medications: Infusions:   Scheduled Medications: . azaTHIOprine  100 mg Oral Daily  . docusate sodium  100 mg Oral BID  . ferrous sulfate  325 mg Oral BID WC  . hydroxychloroquine  300 mg Oral Daily  . polyethylene glycol  17 g Oral Daily  . sodium chloride flush  3 mL Intravenous Q12H    have reviewed scheduled and prn medications.  Physical Exam: General:NAD, comfortable Heart:RRR, s1s2 nl Lungs:clear b/l, no crackle Abdomen:soft, Non-tender, non-distended Extremities:No edema   Madeline Harper Madeline Harper 02/14/2018,10:56 AM  LOS: 5 days

## 2018-02-15 LAB — PROTIME-INR
INR: 1.02
Prothrombin Time: 13.3 seconds (ref 11.4–15.2)

## 2018-02-15 LAB — BASIC METABOLIC PANEL
Anion gap: 5 (ref 5–15)
BUN: 17 mg/dL (ref 8–23)
CHLORIDE: 113 mmol/L — AB (ref 98–111)
CO2: 23 mmol/L (ref 22–32)
Calcium: 8.3 mg/dL — ABNORMAL LOW (ref 8.9–10.3)
Creatinine, Ser: 1.69 mg/dL — ABNORMAL HIGH (ref 0.44–1.00)
GFR calc Af Amer: 35 mL/min — ABNORMAL LOW (ref >60)
GFR calc non Af Amer: 30 mL/min — ABNORMAL LOW (ref >60)
GLUCOSE: 91 mg/dL (ref 70–99)
POTASSIUM: 3.6 mmol/L (ref 3.5–5.1)
SODIUM: 141 mmol/L (ref 135–145)

## 2018-02-15 LAB — HEPARIN LEVEL (UNFRACTIONATED): HEPARIN UNFRACTIONATED: 0.47 [IU]/mL (ref 0.30–0.70)

## 2018-02-15 LAB — CBC WITH DIFFERENTIAL/PLATELET
Abs Immature Granulocytes: 0.04 10*3/uL (ref 0.00–0.07)
BASOS PCT: 0 %
Basophils Absolute: 0 10*3/uL (ref 0.0–0.1)
EOS PCT: 2 %
Eosinophils Absolute: 0.1 10*3/uL (ref 0.0–0.5)
HCT: 27.9 % — ABNORMAL LOW (ref 36.0–46.0)
HEMOGLOBIN: 8.4 g/dL — AB (ref 12.0–15.0)
Immature Granulocytes: 1 %
LYMPHS PCT: 16 %
Lymphs Abs: 0.6 10*3/uL — ABNORMAL LOW (ref 0.7–4.0)
MCH: 26.9 pg (ref 26.0–34.0)
MCHC: 30.1 g/dL (ref 30.0–36.0)
MCV: 89.4 fL (ref 80.0–100.0)
MONO ABS: 0.3 10*3/uL (ref 0.1–1.0)
MONOS PCT: 10 %
Neutro Abs: 2.4 10*3/uL (ref 1.7–7.7)
Neutrophils Relative %: 71 %
Platelets: 201 10*3/uL (ref 150–400)
RBC: 3.12 MIL/uL — AB (ref 3.87–5.11)
RDW: 15.1 % (ref 11.5–15.5)
WBC: 3.4 10*3/uL — AB (ref 4.0–10.5)
nRBC: 0 % (ref 0.0–0.2)

## 2018-02-15 LAB — GLUCOSE, CAPILLARY: GLUCOSE-CAPILLARY: 88 mg/dL (ref 70–99)

## 2018-02-15 LAB — APTT: APTT: 54 s — AB (ref 24–36)

## 2018-02-15 NOTE — Progress Notes (Signed)
[facility] PROGRESS NOTE  Assessment/ Plan: Pt is a 67 y.o. yo female with history of mixed connective tissue disease,SLE diagnosed in 2017, interstitial lung disease undergoing evaluation with pulmonologist, proteinuria, presented with left flank pain found to have acute splenic infarct.  Started on anticoagulation.  TEE with no thrombus.  On admission creatinine was 1.05.  She had IV contrast and serum creatinine level peaked to 2.05.  She is my clinic patient whom I saw once for proteinuria and planned for kidney biopsy.  The outpatient labs were remarkable for low complement, high anti-Smith antibody, high Sjogren anti-SSA, ANA +1:1280, speckled. 24-hour urine protein 2.5 g creatinine 1.02, albumin 2.9 in 01/2018.  #Acute kidney injury due to contrast nephropathy and hemodynamic mediated: Patient received IV contrast on 11/3.  The serum creatinine level was1.05, peaked to 2.05 on 11/8.  Briefly treated with IV fluid which was discontinued yesterday.  Serum creatinine level improving to 1.6 today and urine output of 2.2 L.  - The urinalysis shows chronic proteinuria but no RBC or WBC. The [place] with normal size kidney, no hydronephrosis and benign left renal cyst. The volume status is okay. -I recommend to hold lisinopril on discharge until she follows with me in the clinic.  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  Plan for kidney biopsy on Monday by IR.  Eliquis on hold now and receiving IV heparin..   Repeat a urine PCR 2.31 -  Lisinopril is on hold because of acute kidney injury.  #Acute splenic infarction: Echo was done.  Currently asymptomatic.    IV heparin for systemic anticoagulation, need to hold before biopsy.  Discussed with primary team.  #History of lupus and mixed connective tissue disease: Currently on Plaquenil and Imuran.  She follows with her rheumatologist and pulmonologist.   Subjective: Seen and examined at bedside.  Patient is very  comfortable.  Nonoliguric.  Denied headache, dizziness, nausea, vomiting, chest pain, shortness of breath.  Patient's daughter at bedside.  Objective Vital signs in last 24 hours: Vitals:   02/14/18 0935 02/14/18 1809 02/14/18 1834 02/14/18 2034  BP: (!) 163/69 (!) 179/76 (!) 161/86 (!) 152/77  Pulse: 90 91  84  Resp: 16 18  16   Temp: 98.3 F (36.8 C) 98.5 F (36.9 C)  98.6 F (37 C)  TempSrc: Oral Oral  Oral  SpO2: 98% 100%  96%  Weight:      Height:       Weight change:   Intake/Output Summary (Last 24 hours) at 02/15/2018 1034 Last data filed at 02/15/2018 0600 Gross per 24 hour  Intake 560 ml  Output 2200 ml  Net -1640 ml       Labs: Basic Metabolic Panel: Recent Labs  Lab 02/12/18 0622 02/13/18 0518 02/14/18 0525 02/15/18 0616  NA 136 139 141 141  K 4.4 4.0 3.8 3.6  CL 110 113* 114* 113*  CO2 20* 23 20* 23  GLUCOSE 77 88 87 91  BUN 27* 23 20 17   CREATININE 2.18* 2.05* 1.71* 1.69*  CALCIUM 8.0* 8.4* 8.0* 8.3*  PHOS 3.9 4.1  --   --    Liver Function Tests: Recent Labs  Lab 02/09/18 0556 02/12/18 0622 02/13/18 0518  AST 19  --   --   ALT 8  --   --   ALKPHOS 45  --   --   BILITOT 0.5  --   --   PROT 7.0  --   --   ALBUMIN 1.6* 1.4* 1.4*  No results for input(s): LIPASE, AMYLASE in the last 168 hours. No results for input(s): AMMONIA in the last 168 hours. CBC: Recent Labs  Lab 02/11/18 0228 02/12/18 0622  02/13/18 0518 02/14/18 0525 02/15/18 0616  WBC 4.7 3.7*  --  3.1* 3.1* 3.4*  NEUTROABS  --   --   --   --  2.3 2.4  HGB 8.3* 7.0*   < > 8.0* 7.9* 8.4*  HCT 27.7* 23.3*   < > 27.5* 27.5* 27.9*  MCV 90.2 91.0  --  90.8 90.8 89.4  PLT 167 157  --  209 224 201   < > = values in this interval not displayed.   Cardiac Enzymes: Recent Labs  Lab 03/07/2018 1419 02/21/2018 1905 02/11/18 0228  TROPONINI <0.03 <0.03 <0.03   CBG: Recent Labs  Lab 02/15/2018 1229 03/07/2018 1310 03/07/2018 1719 03/05/2018 1758 02/14/2018 1833  GLUCAP 55* 84  36* 84 97    Iron Studies: No results for input(s): IRON, TIBC, TRANSFERRIN, FERRITIN in the last 72 hours. Studies/Results: No results found.  Medications: Infusions: . heparin 1,100 Units/hr (02/15/18 6962)    Scheduled Medications: . azaTHIOprine  100 mg Oral Daily  . docusate sodium  100 mg Oral BID  . ferrous sulfate  325 mg Oral BID WC  . hydroxychloroquine  300 mg Oral Daily  . polyethylene glycol  17 g Oral Daily  . sodium chloride flush  3 mL Intravenous Q12H    have reviewed scheduled and prn medications.  Physical Exam: General: Not in distress, comfortable Heart: Blood rate rhythm, S1-S2 normal Lungs: clear bilateral, no wheezing or crackles Abdomen:soft, Non-tender, non-distended Extremities:No edema   Madeline Harper Madeline Harper 02/15/2018,10:34 AM  LOS: 6 days

## 2018-02-15 NOTE — Progress Notes (Signed)
[facility] PROGRESS NOTE  Madeline Harper ZOX:[id number] DOB: Nov 13, 1950 DOA: 02/07/2018 PCP: Pearline Cables, MD   Narrative: 67 year old female Features of lupus, mixed connective tissue disorder diagnosed 01/2016 and on Plaquenil since then with a break in 06/2016-on Plaquenil 300 daily and Imuran (Imuran was recommended for UIP) Prior history of STDs in the distant past Admitted on 02/09/2017 acute splenic infarct?  Vasculitis-infectious disease was consulted and signed off as it was felt after initial work-up with negative TEE and evidence of abscess that this was not infectious in nature Needed IV pain meds on admission  Had a slight rising creatinine during hospital stay and as a result nephrology was consulted-appears to have been seen by nephrology in the outpatient setting and work-up was initiated because of proteinuria and risk of lupus nephritis  Nephrology consulted to help delineate further planning and renal biopsy is being planned for 11/11  A & Plan Splenic infarct-Infectious? Vasculitic?-ECHO as part of work-up no specific findings currently- antiphospholipid panel, lupus anticoagulant cardiolipin antibodies and beta-2 glycoprotein this time are all negative LDH uric acid smear protein C, protein S, other work-up all negative at this time BC x 2 are neg Pain is currently controlled meds have been discontinued with the same  Mixed connective tissue disorder with features of lupus-we will need outpatient follow-up with Dr. Victorino Dike of rheumatology who she sees-they may be able to offer further guidance  Throat pain-add viscous lidocaine-discontinued 11/9  Fever-likely secondary to splenic infarct-lactic acid is low and procalcitonin is low not suggestive of infection agree with holding antibiotics  AKI-renal ATN versus contrast nephropathy because had CT with contrast - FeNa 0.4-ACE discontinued, Naprosyn discontinued Nephrology discontinued IV fluids and  creatinine resolved to some degree Work-up planned with renal biopsy on 02/16/2018 for possible lupus  Iron deficiency anemia-iron 10, saturation ratios 9 given IV iron-cont Ferrous sulph  Mild obesity BMI 26   DVT prophylaxis: On IV heparin code Status: Full code family Communication: Discussed with daughter disposition Plan: Inpatient awaiting renal biopsy planned for 02/16/2018 and if all stable may be home after this in am   Madeline Menghini, MD  [facility] Direct contact: [phone] --Via amion app OR  --[email or web address]; password [email or web address]  7PM-7AM contact night coverage as above 02/15/2018, 9:56 AM  LOS: 6 days   Consultants:  No  Procedures:  No  Antimicrobials:  No  Interval history/Subjective:  Awake pleasant no distress Had a mild low-grade fever yesterday but nothing about 100.5 Blood pressure is also up but overall she looks stable She is passing good urine No pain abdomen no vomiting or nausea at this time   Objective:  Vitals:  Vitals:   02/14/18 1834 02/14/18 2034  BP: (!) 161/86 (!) 152/77  Pulse:  84  Resp:  16  Temp:  98.6 F (37 C)  SpO2:  96%    Exam:  EOMI NCAT Throat clear S1-S2 no murmur Abdomen soft nontender nondistended no rebound no guarding Neurologically intact   I have personally reviewed the following:   Labs:  Creatinine of 2.1 peak--> currently 20/1.7->17/1.6  Hemoglobin 8.0-> 7.9 no white count although slight drop 3.1 platelets  Lupus anticoagulant 34.1, DRV VT 31.4 with no lupus anticoagulate detected  Beta-2 glycoprotein less than 9  Anticardiolipin IgG IgM and IgA less than 9  Pathologist smear review showed normocytic anemia  haptoGlobin was 329 slightly elevated  Protein creatinine ratio 2.3  Uric acid 6.6  Imaging studies:  Done on admission 11/3 Echocardiogram/TEE performed March 01, 2018  showedStudy Conclusions  - Left ventricle: Systolic function was normal. The estimated   ejection fraction  was in the range of 55% to 60%. Wall motion was   normal; there were no regional wall motion abnormalities. - Mitral valve: There was trivial regurgitation. - Left atrium: No evidence of thrombus in the atrial cavity or   appendage. No evidence of thrombus in the atrial cavity or   appendage. - Right atrium: No evidence of thrombus in the atrial cavity or   appendage. - Atrial septum: A patent foramen ovale cannot be excluded. No flow   noted on color flow Doppler. Saline microcavitation study was   initially negative but there were a few bubble with the   application of abdominal pressure. Cannot rule out small PFO.    Medical tests:  n   Test discussed with performing physician:  n  Decision to obtain old records:  n  Review and summation of old records:  y  Scheduled Meds: . azaTHIOprine  100 mg Oral Daily  . docusate sodium  100 mg Oral BID  . ferrous sulfate  325 mg Oral BID WC  . hydroxychloroquine  300 mg Oral Daily  . polyethylene glycol  17 g Oral Daily  . sodium chloride flush  3 mL Intravenous Q12H   Continuous Infusions: . 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

## 2018-02-15 NOTE — Progress Notes (Signed)
ANTICOAGULATION CONSULT NOTE Pharmacy Consult for Apixaban back to heparin Indication: splenic infarct  Allergies  Allergen Reactions  . Lyrica [Pregabalin] Swelling    Pedal edema     Assessment: 64 YOF who presented on 11/3 with L-flank pain and was found to have a splenic infarct - currently evaluating for source.  Transitioning back to heparin for planned kidney biopsy to begin 11/10 Heparin level = 0.47, PTT = 54 seconds (coorelating)  Goal of Therapy:  PTT = 66 to 102 seconds Heparin level = 0.3 to 0.7 Monitor platelets by anticoagulation protocol: Yes   Plan:  Heparin to 1200 units / hr to prevent drop to less than 0.3 Daily heparin level, CBC, PTT  Thank you Okey Regal, PharmD [phone] Please utilize Amion (under [facility]) for appropriate number for your unit pharmacist. 02/15/2018 3:30 PM

## 2018-02-16 ENCOUNTER — Inpatient Hospital Stay (HOSPITAL_COMMUNITY): Payer: Medicare Other

## 2018-02-16 LAB — CBC
HCT: 32.4 % — ABNORMAL LOW (ref 36.0–46.0)
HEMOGLOBIN: 9.5 g/dL — AB (ref 12.0–15.0)
MCH: 26.2 pg (ref 26.0–34.0)
MCHC: 29.3 g/dL — AB (ref 30.0–36.0)
MCV: 89.5 fL (ref 80.0–100.0)
Platelets: 261 10*3/uL (ref 150–400)
RBC: 3.62 MIL/uL — ABNORMAL LOW (ref 3.87–5.11)
RDW: 14.9 % (ref 11.5–15.5)
WBC: 4.2 10*3/uL (ref 4.0–10.5)
nRBC: 0 % (ref 0.0–0.2)

## 2018-02-16 LAB — RENAL FUNCTION PANEL
Albumin: 1.8 g/dL — ABNORMAL LOW (ref 3.5–5.0)
Anion gap: 7 (ref 5–15)
BUN: 17 mg/dL (ref 8–23)
CALCIUM: 8.4 mg/dL — AB (ref 8.9–10.3)
CHLORIDE: 111 mmol/L (ref 98–111)
CO2: 21 mmol/L — ABNORMAL LOW (ref 22–32)
CREATININE: 1.57 mg/dL — AB (ref 0.44–1.00)
GFR calc Af Amer: 39 mL/min — ABNORMAL LOW (ref >60)
GFR calc non Af Amer: 33 mL/min — ABNORMAL LOW (ref >60)
Glucose, Bld: 131 mg/dL — ABNORMAL HIGH (ref 70–99)
Phosphorus: 4.5 mg/dL (ref 2.5–4.6)
Potassium: 3.6 mmol/L (ref 3.5–5.1)
Sodium: 139 mmol/L (ref 135–145)

## 2018-02-16 LAB — HEPATIC FUNCTION PANEL
ALK PHOS: 78 U/L (ref 38–126)
ALT: 48 U/L — AB (ref 0–44)
AST: 57 U/L — ABNORMAL HIGH (ref 15–41)
Albumin: 1.8 g/dL — ABNORMAL LOW (ref 3.5–5.0)
BILIRUBIN TOTAL: 0.4 mg/dL (ref 0.3–1.2)
Total Protein: 7.7 g/dL (ref 6.5–8.1)

## 2018-02-16 LAB — LIPASE, BLOOD: Lipase: 27 U/L (ref 11–51)

## 2018-02-16 LAB — HEPARIN LEVEL (UNFRACTIONATED): HEPARIN UNFRACTIONATED: 0.8 [IU]/mL — AB (ref 0.30–0.70)

## 2018-02-16 LAB — APTT: aPTT: 69 seconds — ABNORMAL HIGH (ref 24–36)

## 2018-02-16 MED ORDER — FAMOTIDINE IN NACL 20-0.9 MG/50ML-% IV SOLN
20.0000 mg | INTRAVENOUS | Status: DC
  Administered 2018-02-16 – 2018-02-18 (×3): 20 mg via INTRAVENOUS
  Filled 2018-02-16 (×3): qty 50

## 2018-02-16 MED ORDER — MORPHINE SULFATE (PF) 2 MG/ML IV SOLN
2.0000 mg | Freq: Once | INTRAVENOUS | Status: AC
  Administered 2018-02-16: 2 mg via INTRAVENOUS

## 2018-02-16 MED ORDER — HYDRALAZINE HCL 20 MG/ML IJ SOLN
10.0000 mg | Freq: Four times a day (QID) | INTRAMUSCULAR | Status: DC | PRN
  Administered 2018-02-16 – 2018-02-18 (×3): 10 mg via INTRAVENOUS
  Filled 2018-02-16 (×3): qty 1

## 2018-02-16 MED ORDER — LIDOCAINE VISCOUS HCL 2 % MT SOLN
15.0000 mL | OROMUCOSAL | Status: DC | PRN
  Administered 2018-02-18: 15 mL via OROMUCOSAL
  Filled 2018-02-16 (×2): qty 15

## 2018-02-16 MED ORDER — PANTOPRAZOLE SODIUM 40 MG IV SOLR
40.0000 mg | Freq: Two times a day (BID) | INTRAVENOUS | Status: DC
  Administered 2018-02-16 – 2018-02-18 (×5): 40 mg via INTRAVENOUS
  Filled 2018-02-16 (×5): qty 40

## 2018-02-16 MED ORDER — DEXTROSE-NACL 5-0.9 % IV SOLN
INTRAVENOUS | Status: DC
  Administered 2018-02-16 – 2018-02-18 (×3): via INTRAVENOUS

## 2018-02-16 MED ORDER — MORPHINE SULFATE (PF) 2 MG/ML IV SOLN
2.0000 mg | INTRAVENOUS | Status: DC | PRN
  Administered 2018-02-16 (×4): 2 mg via INTRAVENOUS
  Filled 2018-02-16 (×6): qty 1

## 2018-02-16 MED ORDER — AMLODIPINE BESYLATE 5 MG PO TABS
5.0000 mg | ORAL_TABLET | Freq: Every day | ORAL | Status: DC
  Administered 2018-02-16: 5 mg via ORAL
  Filled 2018-02-16: qty 1

## 2018-02-16 MED ORDER — SCOPOLAMINE 1 MG/3DAYS TD PT72
1.0000 | MEDICATED_PATCH | TRANSDERMAL | Status: DC
  Administered 2018-02-16: 1.5 mg via TRANSDERMAL
  Filled 2018-02-16 (×2): qty 1

## 2018-02-16 MED ORDER — MORPHINE SULFATE (PF) 2 MG/ML IV SOLN
2.0000 mg | INTRAVENOUS | Status: DC | PRN
  Administered 2018-02-16 – 2018-02-17 (×8): 2 mg via INTRAVENOUS
  Filled 2018-02-16 (×7): qty 1

## 2018-02-16 NOTE — Progress Notes (Signed)
Patient complained of mid epigastric abdominal pain.  She rates as 5/10 and describes as achiness.  She states the pain is sudden and aching in nature.  She has also vomited 4 times tonight.  She states she is not nauseated in between episodes of vomiting and that the vomiting comes on sudden like the epigastric pain.  She c/o tenderness to the epigastric region when palpated.  VS stable.  Dr. Caleb Popp made aware and Hepatic Panel added to AM Labs.  Zofran IV given as 9562.  Will continue to monitor.  Alizza Sacra RN=BC, [employer]

## 2018-02-16 NOTE — Progress Notes (Signed)
   02/16/18 0649  Pain Assessment  Pain Scale 0-10  Pain Score 10  Faces Pain Scale 10  Pain Type Acute pain  Pain Location Abdomen  Pain Orientation Upper;Mid  Pain Descriptors / Indicators Throbbing;Constant  Pain Frequency Constant  Pain Onset Sudden  Patients Stated Pain Goal 0  Pain Intervention(s) MD notified (Comment);Emotional support  Provider Notification  Provider Name/Title Dr. Caleb Popp  Date Provider Notified 02/16/18  Time Provider Notified [phone]  Notification Type Page  Notification Reason Change in status (Severe Abd pain 10/10.  Next dose Morphine not till 0825.)   Received order to give additional dose of IV Morphine 2 mg which was given @ 0656.  Patient vomited again.  No signs of bleeding.  Currently patient is drowsy and complaining of no pain.  VS are stable.  Dr. [name] is aware and will see patient.  Bernie Covey RN-BC, [employer]

## 2018-02-16 NOTE — Progress Notes (Signed)
Called to bring pt down for kidney biopsy. Nurse states Dr. Celene Kras biopsy earlier and ordered a diet. Pt eating lunch at this time. Informed nurse biopsy will not be done today. If needed, we can do tomorrow.

## 2018-02-16 NOTE — Progress Notes (Signed)
PT Cancellation Note  Patient Details Name: Madeline Harper MRN: [id number] DOB: [birth year]   Cancelled Treatment:    Reason Eval/Treat Not Completed: Pain limiting ability to participate. Pt reports she is having severe abdominal pain and declines mobility with PT. Offered position change and pt continues to decline being moved at this time. RN notified. Will continue to follow.    Marylynn Pearson 02/16/2018, 8:45 AM  Conni Slipper, PT, DPT Acute Rehabilitation Services Pager: [phone] Office: [phone]

## 2018-02-16 NOTE — Progress Notes (Signed)
[facility] PROGRESS NOTE  Madeline Harper RUE:[id number] DOB: 1950-12-01 DOA: 02/18/2018 PCP: Pearline Cables, MD   Narrative: 67 year old female Features of lupus, mixed connective tissue disorder diagnosed 01/2016 and on Plaquenil since then with a break in 06/2016-on Plaquenil 300 daily and Imuran (Imuran was recommended for UIP) Prior history of STDs in the distant past Admitted on 02/09/2017 acute splenic infarct?  Vasculitis-infectious disease was consulted and signed off as it was felt after initial work-up with negative TEE and evidence of abscess that this was not infectious in nature Needed IV pain meds on admission  Had a slight rising creatinine during hospital stay and as a result nephrology was consulted-appears to have been seen by nephrology in the outpatient setting and work-up was initiated because of proteinuria and risk of lupus nephritis  Nephrology consulted to help delineate further planning and renal biopsy is being planned for 11/11  A & Plan Splenic infarct-Infectious? Vasculitic?-ECHO as part of work-up no specific findings currently- antiphospholipid panel, lupus anticoagulant cardiolipin antibodies and beta-2 glycoprotein this time are all negative LDH uric acid smear protein C, protein S, other work-up all negative at this time BC x 2 are neg Pain is currently controlled meds have been discontinued with the same  Abd pain-recurrent overnight 11/10 at 01:00 assoc n/v and 1 episode brown emesis--no recurrence since vomit-relieved by morphine-pain controlled now--would start CLD and graduate cautiously DDX Pancreatitis vs possible Gastritis-Will start PPI protonix bid, Pepcid, get lidocaine swallow and Lipase  Would hold heparin for now in view of brown emesis and resume if no further issue later today--if further vomit  Mixed connective tissue disorder with features of lupus-we will need outpatient follow-up with Dr. Victorino Dike of rheumatology who she  sees-they may be able to offer further guidance  Throat pain-see above discussion  Fever-likely secondary to splenic infarct-lactic acid is low and procalcitonin is low not suggestive of infection agree with holding antibiotics  AKI-renal ATN versus contrast nephropathy because had CT with contrast - FeNa 0.4-ACE discontinued, Naprosyn discontinued Nephrology discontinued IV fluids and creatinine resolved to some degree On hold now renal biopsy on 02/16/2018 fgiven above issues  Iron deficiency anemia-iron 10, saturation ratios 9 given IV iron-cont Ferrous sulph  Mild obesity BMI 26   DVT prophylaxis: On IV heparin code Status: Full code family Communication: Discussed with daughter disposition Plan: Inpatient awaiting renal biopsy planned for 02/16/2018 and if all stable may be home after this in am   [name], MD  [facility] Direct contact: [phone] --Via amion app OR  --[email or web address]; password [email or web address]  7PM-7AM contact night coverage as above 02/16/2018, 10:20 AM  LOS: 7 days   Consultants:  No  Procedures:  No  Antimicrobials:  No  Interval history/Subjective:  ovenright events of n/v abd pain Some brown emesis Needing morphine Overall slight improved She is not really hungry Still with epigastric pain No fever  Objective:  Vitals:  Vitals:   02/16/18 0703 02/16/18 0924  BP: (!) 157/91 (!) 174/92  Pulse: 91 91  Resp: (!) 22 20  Temp: 98 F (36.7 C) 98.2 F (36.8 C)  SpO2: 97% 97%    Exam:  EOMI NCAT Throat clear S1-S2 no murmur Abdomen soft nontender nondistended no rebound no guarding Neurologically intact   I have personally reviewed the following:   Labs:  Creatinine of 2.1 peak--> currently 20/1.7->17/1.5  Hemoglobin 8.0-> 7.->9.5 no white count although slight drop 3.1 platelets  Lupus anticoagulant 34.1, DRV VT 31.4 with no lupus  anticoagulate detected  Beta-2 glycoprotein less than 9  Anticardiolipin IgG IgM and IgA  less than 9  Pathologist smear review showed normocytic anemia  haptoGlobin was 329 slightly elevated  Protein creatinine ratio 2.3  Uric acid 6.6  Imaging studies:  Done on admission 11/3 Echocardiogram/TEE performed 02/25/2018 showedStudy Conclusions  - Left ventricle: Systolic function was normal. The estimated   ejection fraction was in the range of 55% to 60%. Wall motion was   normal; there were no regional wall motion abnormalities. - Mitral valve: There was trivial regurgitation. - Left atrium: No evidence of thrombus in the atrial cavity or   appendage. No evidence of thrombus in the atrial cavity or   appendage. - Right atrium: No evidence of thrombus in the atrial cavity or   appendage. - Atrial septum: A patent foramen ovale cannot be excluded. No flow   noted on color flow Doppler. Saline microcavitation study was   initially negative but there were a few bubble with the   application of abdominal pressure. Cannot rule out small PFO.    Medical tests:  n   Test discussed with performing physician:  n  Decision to obtain old records:  n  Review and summation of old records:  y  Scheduled Meds: . azaTHIOprine  100 mg Oral Daily  . docusate sodium  100 mg Oral BID  . ferrous sulfate  325 mg Oral BID WC  . hydroxychloroquine  300 mg Oral Daily  . pantoprazole (PROTONIX) IV  40 mg Intravenous Q12H  . polyethylene glycol  17 g Oral Daily  . sodium chloride flush  3 mL Intravenous Q12H   Continuous Infusions: . 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

## 2018-02-16 NOTE — Progress Notes (Signed)
ANTICOAGULATION CONSULT NOTE - Follow Up Consult  Pharmacy Consult for Heparin Indication: Splenic infarct  Patient Measurements: Height: 4\' 11"  (149.9 cm) Weight: 132 lb (59.9 kg) IBW/kg (Calculated) : 43.2 Heparin Dosing Weight: 55.8 kg  Vital Signs: Temp: 98 F (36.7 C) (11/11 0703) Temp Source: Oral (11/11 0703) BP: 157/91 (11/11 0703) Pulse Rate: 91 (11/11 0703)  Labs: Recent Labs    02/14/18 0525 02/15/18 0616 02/15/18 1437 02/16/18 0431  HGB 7.9* 8.4*  --  9.5*  HCT 27.5* 27.9*  --  32.4*  PLT 224 201  --  261  APTT  --   --  54* 69*  LABPROT  --   --  13.3  --   INR  --   --  1.02  --   HEPARINUNFRC  --   --  0.47 0.80*  CREATININE 1.71* 1.69*  --  1.57*    Estimated Creatinine Clearance: 27.8 mL/min (A) (by C-G formula based on SCr of 1.57 mg/dL (H)).   Medications:  Infusions:  . heparin Stopped (02/16/18 0809)    Assessment: Madeline Harper who presented on 11/3 with L-flank pain and was found to have a splenic infarct - currently evaluating for source. Pharmacy consulted to dose Heparin for anticoagulation.   Heparin turned OFF this AM for renal biopsy by IR - now delayed. Per MD - to continue to hold for evaluation of brown emesis and abdominal pain.   Goal of Therapy:  Heparin level 0.3-0.7 units/ml Monitor platelets by anticoagulation protocol: Yes   Plan:  - Heparin held pending IR renal biopsy - possibly on 11/12 and further evaluation of abdominal pain - Will follow along for restart plans  Thank you for allowing pharmacy to be a part of this patient's care.  Georgina Pillion, PharmD, BCPS Clinical Pharmacist Pager: [phone] Clinical phone for 02/16/2018 from 7a-3:30p: [phone] If after 3:30p, please call main pharmacy at: [phone] Please check AMION for all [facility] Pharmacy numbers 02/16/2018 8:26 AM

## 2018-02-16 NOTE — Progress Notes (Signed)
Patient now complaining of severe upper epigastric pain.  Rates as 10/10.  Describes as throbbing.  B/P 191/95.  Hypoactive bowel sounds.  Patient is moaning and very restless on the bed.  Dr. Caleb Popp made aware.  AM labs changed to Stat and were drawn.  Stat Abd Series ordered.  IV Morphine ordered and 2 mg given at 0425.  Will continue to monitor patient.  Bernie Covey RN-BC, [employer]

## 2018-02-16 NOTE — Progress Notes (Signed)
S: Reports acute onset of N/V and epigastric pain which started at 11pm yesterday and has persisted.  No diarrhea, hematemesis, melena or hematochezia.  "I don't feel well"   O:BP (!) 174/92 (BP Location: Left Arm)   Pulse 91   Temp 98.2 F (36.8 C) (Oral)   Resp 20   Ht 4\' 11"  (1.499 m)   Wt 59.9 kg   SpO2 97%   BMI 26.66 kg/m   Intake/Output Summary (Last 24 hours) at 02/16/2018 1343 Last data filed at 02/16/2018 0831 Gross per 24 hour  Intake 838.47 ml  Output 0 ml  Net 838.47 ml   Intake/Output: I/O last 3 completed shifts: In: 1239.4 [P.O.:970; I.V.:269.4] Out: 1950 [Urine:1950]  Intake/Output this shift:  No intake/output data recorded. Weight change:  Gen: appears to be in mild distress CVS: no rub Resp: cta Abd: +BS, soft, +epigastric tenderness, no guarding/rebound Ext: no edema  Recent Labs  Lab 02/11/18 0228 02/12/18 0622 02/13/18 0518 02/14/18 0525 02/15/18 0616 02/16/18 0431  NA 135 136 139 141 141 139  K 4.0 4.4 4.0 3.8 3.6 3.6  CL 109 110 113* 114* 113* 111  CO2 19* 20* 23 20* 23 21*  GLUCOSE 107* 77 88 87 91 131*  BUN 21 27* 23 20 17 17   CREATININE 1.71* 2.18* 2.05* 1.71* 1.69* 1.57*  ALBUMIN  --  1.4* 1.4*  --   --  1.8*  1.8*  CALCIUM 7.9* 8.0* 8.4* 8.0* 8.3* 8.4*  PHOS  --  3.9 4.1  --   --  4.5  AST  --   --   --   --   --  57*  ALT  --   --   --   --   --  48*   Liver Function Tests: Recent Labs  Lab 02/12/18 0622 02/13/18 0518 02/16/18 0431  AST  --   --  57*  ALT  --   --  48*  ALKPHOS  --   --  78  BILITOT  --   --  0.4  PROT  --   --  7.7  ALBUMIN 1.4* 1.4* 1.8*  1.8*   Recent Labs  Lab 02/16/18 0431  LIPASE 27   No results for input(s): AMMONIA in the last 168 hours. CBC: Recent Labs  Lab 02/12/18 0622  02/13/18 0518 02/14/18 0525 02/15/18 0616 02/16/18 0431  WBC 3.7*  --  3.1* 3.1* 3.4* 4.2  NEUTROABS  --   --   --  2.3 2.4  --   HGB 7.0*   < > 8.0* 7.9* 8.4* 9.5*  HCT 23.3*   < > 27.5* 27.5* 27.9* 32.4*   MCV 91.0  --  90.8 90.8 89.4 89.5  PLT 157  --  209 224 201 261   < > = values in this interval not displayed.   Cardiac Enzymes: Recent Labs  Lab 02/22/2018 1419 03/02/2018 1905 02/11/18 0228  TROPONINI <0.03 <0.03 <0.03   CBG: Recent Labs  Lab 02/27/2018 1310 02/23/2018 1719 02/25/2018 1758 02/21/2018 1833 02/15/18 1647  GLUCAP 84 36* 84 97 88    Iron Studies: No results for input(s): IRON, TIBC, TRANSFERRIN, FERRITIN in the last 72 hours. Studies/Results: Dg Abd Portable 1v  Result Date: 02/16/2018 CLINICAL DATA:  Acute onset of epigastric abdominal pain. EXAM: PORTABLE ABDOMEN - 1 VIEW COMPARISON:  CT of the abdomen and pelvis performed 03/06/2018 FINDINGS: The visualized bowel gas pattern is unremarkable. Scattered air and stool filled loops of colon  are seen; no abnormal dilatation of small bowel loops is seen to suggest small bowel obstruction. No free intra-abdominal air is identified, though evaluation for free air is limited on a single supine view. The visualized osseous structures are within normal limits; the sacroiliac joints are unremarkable in appearance. IMPRESSION: Unremarkable bowel gas pattern; no free intra-abdominal air seen. Electronically Signed   By: Roanna Raider M.D.   On: 02/16/2018 05:00   . azaTHIOprine  100 mg Oral Daily  . docusate sodium  100 mg Oral BID  . ferrous sulfate  325 mg Oral BID WC  . hydroxychloroquine  300 mg Oral Daily  . pantoprazole (PROTONIX) IV  40 mg Intravenous Q12H  . polyethylene glycol  17 g Oral Daily  . sodium chloride flush  3 mL Intravenous Q12H    BMET    Component Value Date/Time   NA 139 02/16/2018 0431   NA 138 12/20/2013   K 3.6 02/16/2018 0431   CL 111 02/16/2018 0431   CO2 21 (L) 02/16/2018 0431   GLUCOSE 131 (H) 02/16/2018 0431   BUN 17 02/16/2018 0431   BUN 20 12/20/2013   CREATININE 1.57 (H) 02/16/2018 0431   CREATININE 0.90 11/18/2016 1350   CALCIUM 8.4 (L) 02/16/2018 0431   GFRNONAA 33 (L) 02/16/2018  0431   GFRNONAA 67 11/18/2016 1350   GFRAA 39 (L) 02/16/2018 0431   GFRAA 78 11/18/2016 1350   CBC    Component Value Date/Time   WBC 4.2 02/16/2018 0431   RBC 3.62 (L) 02/16/2018 0431   HGB 9.5 (L) 02/16/2018 0431   HCT 32.4 (L) 02/16/2018 0431   PLT 261 02/16/2018 0431   MCV 89.5 02/16/2018 0431   MCH 26.2 02/16/2018 0431   MCHC 29.3 (L) 02/16/2018 0431   RDW 14.9 02/16/2018 0431   LYMPHSABS 0.6 (L) 02/15/2018 0616   MONOABS 0.3 02/15/2018 0616   EOSABS 0.1 02/15/2018 0616   BASOSABS 0.0 02/15/2018 0616     Assessment/Plan:  1. 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  Cr is slowly improving, however given abd pain and N/V will hold off on renal biopsy for now.  Continue to hold lisinopril and follow renal function 2. Abdominal pain with N/V- w/u per primary but concerning for gastritis or pancreatitis. 3. Splenic infarction, acute- is on IV heparin but was put on hold for biopsy, cont to hold for now. 4. Proteinuria- as above, hold off on bx for today. 5. HTN- will start bp meds and follow (lisinopril on hold due to AKI/CKD).  Will add amlodipine and may need clonidine prn.  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  Irena Cords, MD [employer] [phone]

## 2018-02-17 ENCOUNTER — Encounter (HOSPITAL_COMMUNITY): Payer: Self-pay | Admitting: Gastroenterology

## 2018-02-17 LAB — CBC WITH DIFFERENTIAL/PLATELET
Abs Immature Granulocytes: 0.07 10*3/uL (ref 0.00–0.07)
BASOS ABS: 0 10*3/uL (ref 0.0–0.1)
Basophils Relative: 0 %
EOS ABS: 0 10*3/uL (ref 0.0–0.5)
EOS PCT: 0 %
HEMATOCRIT: 30.8 % — AB (ref 36.0–46.0)
HEMOGLOBIN: 9.3 g/dL — AB (ref 12.0–15.0)
Immature Granulocytes: 1 %
LYMPHS ABS: 0.6 10*3/uL — AB (ref 0.7–4.0)
LYMPHS PCT: 8 %
MCH: 27.4 pg (ref 26.0–34.0)
MCHC: 30.2 g/dL (ref 30.0–36.0)
MCV: 90.6 fL (ref 80.0–100.0)
MONO ABS: 0.7 10*3/uL (ref 0.1–1.0)
MONOS PCT: 9 %
NRBC: 0.3 % — AB (ref 0.0–0.2)
Neutro Abs: 6.5 10*3/uL (ref 1.7–7.7)
Neutrophils Relative %: 82 %
Platelets: 230 10*3/uL (ref 150–400)
RBC: 3.4 MIL/uL — ABNORMAL LOW (ref 3.87–5.11)
RDW: 15.5 % (ref 11.5–15.5)
WBC: 7.9 10*3/uL (ref 4.0–10.5)

## 2018-02-17 LAB — RENAL FUNCTION PANEL
Albumin: 1.8 g/dL — ABNORMAL LOW (ref 3.5–5.0)
Anion gap: 4 — ABNORMAL LOW (ref 5–15)
BUN: 22 mg/dL (ref 8–23)
CHLORIDE: 111 mmol/L (ref 98–111)
CO2: 24 mmol/L (ref 22–32)
CREATININE: 1.88 mg/dL — AB (ref 0.44–1.00)
Calcium: 8.2 mg/dL — ABNORMAL LOW (ref 8.9–10.3)
GFR calc non Af Amer: 27 mL/min — ABNORMAL LOW (ref >60)
GFR, EST AFRICAN AMERICAN: 31 mL/min — AB (ref >60)
Glucose, Bld: 120 mg/dL — ABNORMAL HIGH (ref 70–99)
Phosphorus: 4.5 mg/dL (ref 2.5–4.6)
Potassium: 4.1 mmol/L (ref 3.5–5.1)
Sodium: 139 mmol/L (ref 135–145)

## 2018-02-17 LAB — HEPARIN LEVEL (UNFRACTIONATED): Heparin Unfractionated: 0.58 IU/mL (ref 0.30–0.70)

## 2018-02-17 MED ORDER — MORPHINE SULFATE (PF) 4 MG/ML IV SOLN
3.0000 mg | INTRAVENOUS | Status: DC | PRN
  Administered 2018-02-18: 3 mg via INTRAVENOUS
  Filled 2018-02-17 (×2): qty 1

## 2018-02-17 MED ORDER — HEPARIN (PORCINE) 25000 UT/250ML-% IV SOLN
1000.0000 [IU]/h | INTRAVENOUS | Status: DC
  Administered 2018-02-18: 1000 [IU]/h via INTRAVENOUS
  Filled 2018-02-17 (×3): qty 250

## 2018-02-17 MED ORDER — METHYLPREDNISOLONE SODIUM SUCC 40 MG IJ SOLR
40.0000 mg | Freq: Two times a day (BID) | INTRAMUSCULAR | Status: DC
  Administered 2018-02-17 – 2018-02-18 (×2): 40 mg via INTRAVENOUS
  Filled 2018-02-17 (×2): qty 1

## 2018-02-17 MED ORDER — PHENOL 1.4 % MT LIQD
1.0000 | OROMUCOSAL | Status: DC | PRN
  Administered 2018-02-17: 1 via OROMUCOSAL
  Filled 2018-02-17: qty 177

## 2018-02-17 NOTE — Progress Notes (Signed)
S:still with abdominal pain and N/V.  Results on CT scan noted O:BP (!) 182/92 (BP Location: Right Arm)   Pulse (!) 108   Temp 98 F (36.7 C) (Oral)   Resp 18   Ht 4\' 11"  (1.499 m)   Wt 58.2 kg   SpO2 94%   BMI 25.91 kg/m   Intake/Output Summary (Last 24 hours) at 02/17/2018 1341 Last data filed at 02/17/2018 0523 Gross per 24 hour  Intake 464 ml  Output 200 ml  Net 264 ml   Intake/Output: I/O last 3 completed shifts: In: 965.3 [P.O.:360; I.V.:558.5; IV Piggyback:46.9] Out: 200 [Emesis/NG output:200]  Intake/Output this shift:  No intake/output data recorded. Weight change:  Gen: mild distress CVS: no rub, tachy Resp: cta Abd: +BS, soft, +tenderness to LUQ Ext: no edema  Recent Labs  Lab 02/11/18 0228 02/12/18 0622 02/13/18 0518 02/14/18 0525 02/15/18 0616 02/16/18 0431 02/17/18 0603  NA 135 136 139 141 141 139 139  K 4.0 4.4 4.0 3.8 3.6 3.6 4.1  CL 109 110 113* 114* 113* 111 111  CO2 19* 20* 23 20* 23 21* 24  GLUCOSE 107* 77 88 87 91 131* 120*  BUN 21 27* 23 20 17 17 22   CREATININE 1.71* 2.18* 2.05* 1.71* 1.69* 1.57* 1.88*  ALBUMIN  --  1.4* 1.4*  --   --  1.8*  1.8* 1.8*  CALCIUM 7.9* 8.0* 8.4* 8.0* 8.3* 8.4* 8.2*  PHOS  --  3.9 4.1  --   --  4.5 4.5  AST  --   --   --   --   --  57*  --   ALT  --   --   --   --   --  48*  --    Liver Function Tests: Recent Labs  Lab 02/13/18 0518 02/16/18 0431 02/17/18 0603  AST  --  57*  --   ALT  --  48*  --   ALKPHOS  --  78  --   BILITOT  --  0.4  --   PROT  --  7.7  --   ALBUMIN 1.4* 1.8*  1.8* 1.8*   Recent Labs  Lab 02/16/18 0431  LIPASE 27   No results for input(s): AMMONIA in the last 168 hours. CBC: Recent Labs  Lab 02/13/18 0518 02/14/18 0525 02/15/18 0616 02/16/18 0431 02/17/18 0603  WBC 3.1* 3.1* 3.4* 4.2 7.9  NEUTROABS  --  2.3 2.4  --  6.5  HGB 8.0* 7.9* 8.4* 9.5* 9.3*  HCT 27.5* 27.5* 27.9* 32.4* 30.8*  MCV 90.8 90.8 89.4 89.5 90.6  PLT 209 224 201 261 230   Cardiac  Enzymes: Recent Labs  Lab 03-06-2018 1419 Mar 06, 2018 1905 02/11/18 0228  TROPONINI <0.03 <0.03 <0.03   CBG: Recent Labs  Lab 06-Mar-2018 1719 2018-03-06 1758 03/06/18 1833 02/15/18 1647  GLUCAP 36* 84 97 88    Iron Studies: No results for input(s): IRON, TIBC, TRANSFERRIN, FERRITIN in the last 72 hours. Studies/Results: Ct Abdomen Pelvis Wo Contrast  Addendum Date: 02/17/2018   ADDENDUM REPORT: 02/17/2018 03:01 ADDENDUM: Findings discussed with Stevie Kern, NP. Electronically Signed   By: Gerome Sam III M.D   On: 02/17/2018 03:01   Result Date: 02/17/2018 CLINICAL DATA:  Upper abdominal pain for 24 hours. Nausea and vomiting. EXAM: CT ABDOMEN AND PELVIS WITHOUT CONTRAST TECHNIQUE: Multidetector CT imaging of the abdomen and pelvis was performed following the standard protocol without IV contrast. COMPARISON:  None. FINDINGS: Lower chest: There is  a new left effusion. There is opacity associated with the effusion which could represent atelectasis or pneumonia. Fibrotic changes in the bases. No other abnormalities in the lower chest. Hepatobiliary: The liver is normal. The gallbladder is distended but this is a stable finding. Pancreas: Unremarkable. No pancreatic ductal dilatation or surrounding inflammatory changes. Spleen: The splenic infarcts described on the comparison study are much less conspicuous on this unenhanced study but do persist. Adrenals/Urinary Tract: Adrenal glands are normal. No hydronephrosis or perinephric stranding. A cyst is seen in the left kidney. No ureterectasis or ureteral stones. The bladder is normal. Stomach/Bowel: The stomach is normal. There is a markedly abnormal loop of proximal jejunum as seen on series 3, image 30 which appears edematous and thick walled. This abnormal loop extends into the left lower quadrant before returning to normal caliber. The remainder of the small bowel is normal. The colon is unremarkable. The appendix is normal in  appearance. Vascular/Lymphatic: Atherosclerotic changes in the nonaneurysmal aorta. No change in lymph nodes. Reproductive: Uterus and bilateral adnexa are unremarkable. Other: New ascites in the abdomen.  No free air. Musculoskeletal: No acute or significant osseous findings. IMPRESSION: 1. Markedly abnormal loop of proximal jejunum extending into the mid jejunum with an edematous appearance. The wall is markedly thickened. Findings are consistent with indeterminate enteritis which could be ischemic, infectious, or inflammatory. Given the possibility of embolic disease on the recent comparison, ischemic enteritis should be considered. 2. New left effusion. 3. The opacity underlying the new left effusion could represent atelectasis or pneumonia. 4. Gallbladder is distended but stable. Ultrasound could better evaluate if warranted. 5. Atherosclerosis in the nonaneurysmal aorta. 6. Suspected splenic infarcts again noted. Findings being called to the referring clinical team. Electronically Signed: By: Gerome Sam III M.D On: 02/17/2018 01:39   Dg Abd Portable 1v  Result Date: 02/16/2018 CLINICAL DATA:  Acute onset of epigastric abdominal pain. EXAM: PORTABLE ABDOMEN - 1 VIEW COMPARISON:  CT of the abdomen and pelvis performed 03/07/2018 FINDINGS: The visualized bowel gas pattern is unremarkable. Scattered air and stool filled loops of colon are seen; no abnormal dilatation of small bowel loops is seen to suggest small bowel obstruction. No free intra-abdominal air is identified, though evaluation for free air is limited on a single supine view. The visualized osseous structures are within normal limits; the sacroiliac joints are unremarkable in appearance. IMPRESSION: Unremarkable bowel gas pattern; no free intra-abdominal air seen. Electronically Signed   By: Roanna Raider M.D.   On: 02/16/2018 05:00   . pantoprazole (PROTONIX) IV  40 mg Intravenous Q12H  . scopolamine  1 patch Transdermal Q72H  . sodium  chloride flush  3 mL Intravenous Q12H    BMET    Component Value Date/Time   NA 139 02/17/2018 0603   NA 138 12/20/2013   K 4.1 02/17/2018 0603   CL 111 02/17/2018 0603   CO2 24 02/17/2018 0603   GLUCOSE 120 (H) 02/17/2018 0603   BUN 22 02/17/2018 0603   BUN 20 12/20/2013   CREATININE 1.88 (H) 02/17/2018 0603   CREATININE 0.90 11/18/2016 1350   CALCIUM 8.2 (L) 02/17/2018 0603   GFRNONAA 27 (L) 02/17/2018 0603   GFRNONAA 67 11/18/2016 1350   GFRAA 31 (L) 02/17/2018 0603   GFRAA 78 11/18/2016 1350   CBC    Component Value Date/Time   WBC 7.9 02/17/2018 0603   RBC 3.40 (L) 02/17/2018 0603   HGB 9.3 (L) 02/17/2018 0603   HCT 30.8 (L) 02/17/2018  0603   PLT 230 02/17/2018 0603   MCV 90.6 02/17/2018 0603   MCH 27.4 02/17/2018 0603   MCHC 30.2 02/17/2018 0603   RDW 15.5 02/17/2018 0603   LYMPHSABS 0.6 (L) 02/17/2018 0603   MONOABS 0.7 02/17/2018 0603   EOSABS 0.0 02/17/2018 0603   BASOSABS 0.0 02/17/2018 0603     Assessment/Plan:  1. 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  Cr is slowly improving, however given abd pain and N/V will hold off on renal biopsy for now.  Continue to hold lisinopril and follow renal function.  She does not want to change her immunosuppression as I discussed possibly changing to cellcept and prednisone.  Primary team to discuss with her Rheumatologist about other suggestions.  Renal biopsy still on hold due to her ischemic bowel off of heparin gtt.  2. Abdominal pain with N/V- w/u with CT revealed possible ischemic enteritis of jejunum and GI has been consulted for further evaluation and management.  She is back on heparin and pain is intermittent but somewhat improved. 3. Splenic infarction, acute- is on IV heparin but was put on hold for biopsy, cont to hold for now.  Hypercoagulable workup negative thus far. 4. Proteinuria- as above, hold off on bx for now.  5. HTN- will start bp  meds and follow (lisinopril on hold due to AKI/CKD).  Will add amlodipine and may need clonidine prn.  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  Madeline CordsJoseph A. Etrulia Zarr, MD [facility] [phone]

## 2018-02-17 NOTE — Progress Notes (Signed)
ANTICOAGULATION CONSULT NOTE - Follow Up Consult  Pharmacy Consult for Heparin Indication: Splenic infarct  Patient Measurements: Height: 4\' 11"  (149.9 cm) Weight: 128 lb 4.9 oz (58.2 kg) IBW/kg (Calculated) : 43.2 Heparin Dosing Weight: 55.8 kg  Vital Signs: Temp: 99.7 F (37.6 C) (11/12 2241) Temp Source: Oral (11/12 2241) BP: 155/67 (11/12 2241) Pulse Rate: 129 (11/12 2241)  Labs: Recent Labs    02/15/18 0616 02/15/18 1437 02/16/18 0431 02/17/18 0603 02/17/18 2138  HGB 8.4*  --  9.5* 9.3*  --   HCT 27.9*  --  32.4* 30.8*  --   PLT 201  --  261 230  --   APTT  --  54* 69*  --   --   LABPROT  --  13.3  --   --   --   INR  --  1.02  --   --   --   HEPARINUNFRC  --  0.47 0.80*  --  0.58  CREATININE 1.69*  --  1.57* 1.88*  --     Estimated Creatinine Clearance: 22.9 mL/min (A) (by C-G formula based on SCr of 1.88 mg/dL (H)).  Assessment: [age] YOF who presented on 11/3 with L-flank pain and was found to have a splenic infarct - currently evaluating for source. Pharmacy consulted to dose Heparin for anticoagulation.   Heparin turned OFF on 11/11 AM for renal biopsy (now postponed) and evaluation of abdominal pain. Abd CT showed possible ischemic enteritis.  Heparin drip resumed tthis morning per per discussion with MD. To target low-therapeutic heparin levels.    Heparin level is 0.58 tonight on 1100 units/hr. Therapeutic, but above low goal.   No bleeding reported.  Goal of Therapy:  Heparin level 0.3-0.5 units/ml Monitor platelets by anticoagulation protocol: Yes   Plan:   Decrease Heparin drip to 1000 units/hr  Daily heparin level and CBC  Monitor for any signs/symptoms of bleeding.  Will continue to follow work-up and evaluation and plans to transition to po anticoagulation.  Dennie Fettersgan, Sasuke Yaffe [place], [place] Pager: [phone]  02/17/2018 10:53 PM

## 2018-02-17 NOTE — Therapy (Signed)
Physical Therapy Treatment Patient Details Name: Madeline Harper MRN: [id number] DOB: 11-24-1950 Today's Date: 02/17/2018    History of Present Illness Pt is a 67 y/o female with a PMH significant for Lupus, cataracts, cervical DDD. She presented with complaints of L flank pain that started saturday morning 11/2, and was found to have a splenic infarct. Complaints of abdomen pain began 11/11, was found to have a small bowl infarct.     PT Comments    Pt amb limited by pain, but amb 80 ft. Difficult to determine if decline in amb distance also related to strength or balance. Son provided handheld support. Children present during session and supportive of pt walking.  Follow Up Recommendations  No PT follow up;Supervision - Intermittent     Equipment Recommendations  None recommended by PT    Recommendations for Other Services       Precautions / Restrictions Precautions Precautions: None Restrictions Weight Bearing Restrictions: No    Mobility  Bed Mobility                  Transfers Overall transfer level: Needs assistance Equipment used: None Transfers: Sit to/from Stand Sit to Stand: Min guard            Ambulation/Gait Ambulation/Gait assistance: Min guard Gait Distance (Feet): 50 Feet Assistive device: 1 person hand held assist Gait Pattern/deviations: Decreased stride length Gait velocity: Decreased Gait velocity interpretation: 1.31 - 2.62 ft/sec, indicative of limited community ambulator General Gait Details: Grimacing and leaning on son due to pain   Stairs             Wheelchair Mobility    Modified Rankin (Stroke Patients Only)       Balance Overall balance assessment: Mild deficits observed, not formally tested                                          Cognition Arousal/Alertness: Awake/alert Behavior During Therapy: Agitated Overall Cognitive Status: Within Functional Limits for tasks assessed                                         Exercises      General Comments        Pertinent Vitals/Pain Pain Assessment: 0-10 Pain Score: 8  Pain Descriptors / Indicators: Grimacing Pain Intervention(s): Limited activity within patient's tolerance;Patient requesting pain meds-RN notified    Home Living                      Prior Function            PT Goals (current goals can now be found in the care plan section) Acute Rehab PT Goals Patient Stated Goal: get home PT Goal Formulation: With patient Time For Goal Achievement: 03/02/18 Potential to Achieve Goals: Good Progress towards PT goals: Progressing toward goals    Frequency    Min 3X/week      PT Plan Current plan remains appropriate    Co-evaluation              AM-PAC PT "6 Clicks" Daily Activity  Outcome Measure  Difficulty turning over in bed (including adjusting bedclothes, sheets and blankets)?: None Difficulty moving from lying on back to sitting on the side of the bed? : None  Difficulty sitting down on and standing up from a chair with arms (e.g., wheelchair, bedside commode, etc,.)?: None Help needed moving to and from a bed to chair (including a wheelchair)?: A Little Help needed walking in hospital room?: A Little Help needed climbing 3-5 steps with a railing? : A Little 6 Click Score: 21    End of Session Equipment Utilized During Treatment: Gait belt Activity Tolerance: Patient limited by pain Patient left: in chair;with family/visitor present Nurse Communication: Mobility status;Patient requests pain meds PT Visit Diagnosis: Unsteadiness on feet (R26.81);Difficulty in walking, not elsewhere classified (R26.2);Pain     Time: [id number] PT Time Calculation (min) (ACUTE ONLY): 15 min  Charges:  $Gait Training: 8-22 mins                     Pattricia Bossnne Bergen Melle, SPT Acute Rehab [phone]   Pattricia Bossnne [place] 02/17/2018, 3:23 PM

## 2018-02-17 NOTE — Progress Notes (Signed)
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  Vasculitis-infectious disease was consulted and signed off as it was felt after initial work-up with negative TEE and evidence of abscess that this was not infectious in nature Needed IV pain meds on admission  Had a slight rising creatinine during hospital stay and as a result nephrology was consulted-appears to have been seen by nephrology in the outpatient setting and work-up was initiated because of proteinuria and risk of lupus nephritis  Nephrology consulted to help delineate further planning and renal biopsy is being planned for 11/11  Developed recurrent severe abdominal pain on 11/11 prompting work-up in addition to CT scan abdomen pelvis that showed probable thickening of mid jejunum and abnormal loop of proximal jejunum possible ischemic versus inflammatory GI consulted I attempted to call her rheumatologist  A & Plan Splenic infarct- Vasculitic-ECHO overall negative antiphospholipid panel, lupus anticoagulant cardiolipin antibodies and beta-2 glycoprotein this time are all negative LDH uric acid smear protein C, protein S, other work-up all negative at this time BC x 2 are neg  Abd pain secondary to possible ischemic gut-recurrent overnight 11/10 at 01:00 assoc n/v -no blood in vomitus, GI consulted-at this time would not speak to interventional radiology as angiogram but probably because azotemia once again-see below-GI has adjusted Pepcid/Protonix Pain is moderately controlled only on 3 mg of morphine every 2-watch for sedation-clearly discussed we will not  escalate beyond this at this time  Mixed connective tissue disorder with features of lupus-attempted to call her current rheumatologist Dr. Lendon Colonel at no avail-I have started her on Metha prednisone 40 twice daily IV as we cannot give her Imuran 100 daily, Plaquenil 300 daily because she is not eating much-we will use this for 3 days and then if she is better and pain is better controlled we will resume her meds  Throat pain-see above discussion  Fever-likely secondary to splenic infarct-lactic acid is low and procalcitonin is low not suggestive of infection agree with holding antibiotics  AKI-renal ATN versus contrast nephropathy because had CT with contrast - FeNa 0.4-ACE discontinued, Naprosyn discontinued On hold now renal biopsy on 02/16/2018 fgiven above issues Creatinine slightly up today  Iron deficiency anemia-iron 10, saturation ratios 9 given IV iron-cont Ferrous sulph  Mild obesity BMI 26   DVT prophylaxis: On IV heparin code Status: Full code family Communication: Discussed with daughter, discussed with's other relatives as well disposition Plan: Inpatient expect stability closer to weekend patient is not ready for discharge because of severe pain and poor p.o. intake   Madeline Menghini, MD  [facility] Direct contact: [phone] --Via amion app OR  --[email or web address]; password [email or web address]  7PM-7AM contact night coverage as above 02/17/2018, 4:26 PM  LOS: 8 days   Consultants:  No  Procedures:  No  Antimicrobials:  No  Interval history/Subjective:  Patient in pain At times it does seem to fall asleep Poorly She has not had any dark vomiting or stool  Objective:  Vitals:  Vitals:   02/17/18 0523 02/17/18 0734  BP: (!) 180/92 (!) 182/92  Pulse: (!) 102 (!) 108  Resp: 18 18  Temp: 97.7 F (36.5 C) 98 F (36.7 C)  SpO2: 98% 94%    Exam:  EOMI NCAT Throat clear S1-S2 no murmur Abdomen soft nontender nondistended no rebound no guarding Neurologically  intact   I have personally reviewed the following:   Labs:  Creatinine of 2.1 peak--> currently 20/1.7->17/1.5->27/1.8  Hemoglobin 8.0-> 7.->9.3  Lupus anticoagulant 34.1, DRV VT 31.4 with no lupus anticoagulate detected   Beta-2 glycoprotein less than 9  Anticardiolipin IgG IgM and IgA less than 9  Pathologist smear review showed normocytic anemia  haptoGlobin was 329 slightly elevated  Protein creatinine ratio 2.3  Uric acid 6.6  Imaging studies:  Done on admission 11/3 Echocardiogram/TEE performed 02/26/2018 showedStudy Conclusions  - Left ventricle: Systolic function was normal. The estimated   ejection fraction was in the range of 55% to 60%. Wall motion was   normal; there were no regional wall motion abnormalities. - Mitral valve: There was trivial regurgitation. - Left atrium: No evidence of thrombus in the atrial cavity or   appendage. No evidence of thrombus in the atrial cavity or   appendage. - Right atrium: No evidence of thrombus in the atrial cavity or   appendage. - Atrial septum: A patent foramen ovale cannot be excluded. No flow   noted on color flow Doppler. Saline microcavitation study was   initially negative but there were a few bubble with the   application of abdominal pressure. Cannot rule out small PFO.    Medical tests:  n   Test discussed with performing physician:  n  Decision to obtain old records:  n  Review and summation of old records:  y  Scheduled Meds: . methylPREDNISolone (SOLU-MEDROL) injection  40 mg Intravenous Q12H  . pantoprazole (PROTONIX) IV  40 mg Intravenous Q12H  . scopolamine  1 patch Transdermal Q72H  . sodium chloride flush  3 mL Intravenous Q12H   Continuous Infusions: . dextrose 5 % and 0.9% NaCl 50 mL/hr at 02/17/18 1603  . famotidine (PEPCID) IV 20 mg (02/17/18 1528)  . 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

## 2018-02-17 NOTE — Progress Notes (Signed)
ANTICOAGULATION CONSULT NOTE - Follow Up Consult  Pharmacy Consult for Heparin Indication: Splenic infarct  Patient Measurements: Height: 4\' 11"  (149.9 cm) Weight: 128 lb 4.9 oz (58.2 kg) IBW/kg (Calculated) : 43.2 Heparin Dosing Weight: 55.8 kg  Vital Signs: Temp: 98 F (36.7 C) (11/12 0734) Temp Source: Oral (11/12 0734) BP: 182/92 (11/12 0734) Pulse Rate: 108 (11/12 0734)  Labs: Recent Labs    02/15/18 0616 02/15/18 1437 02/16/18 0431 02/17/18 0603  HGB 8.4*  --  9.5* 9.3*  HCT 27.9*  --  32.4* 30.8*  PLT 201  --  261 230  APTT  --  54* 69*  --   LABPROT  --  13.3  --   --   INR  --  1.02  --   --   HEPARINUNFRC  --  0.47 0.80*  --   CREATININE 1.69*  --  1.57* 1.88*    Estimated Creatinine Clearance: 22.9 mL/min (A) (by C-G formula based on SCr of 1.88 mg/dL (H)).   Medications:  Infusions:  . dextrose 5 % and 0.9% NaCl 50 mL/hr at 02/16/18 1736  . famotidine (PEPCID) IV 20 mg (02/16/18 1604)    Assessment: [age] YOF who presented on 11/3 with L-flank pain and was found to have a splenic infarct - currently evaluating for source. Pharmacy consulted to dose Heparin for anticoagulation.   Heparin turned OFF on 11/11 AM for renal biopsy (now postponed) and evaluation of abdominal pain. Abd CT showed possible ischemic enteritis. To resume Heparin drip this AM per discussion with MD.   Goal of Therapy:  Heparin level 0.3-0.7 units/ml Monitor platelets by anticoagulation protocol: Yes   Plan:  - Resume Heparin at a rate of 1100 units/hr (11 ml/hr) - Will continue to follow work-up and evaluation and plans to transition to po anticoagulation - Will continue to monitor for any signs/symptoms of bleeding and will follow up with heparin level in 8 hours, daily AM heparin levels  Thank you for allowing pharmacy to be a part of this patient's care.  Georgina PillionElizabeth Sonia Bromell, PharmD, BCPS Clinical Pharmacist Pager: [phone] Clinical phone for 02/17/2018 from 7a-3:30p:  [phone] If after 3:30p, please call main pharmacy at: [phone] Please check AMION for all [facility] numbers 02/17/2018 8:29 AM

## 2018-02-17 NOTE — Consult Note (Signed)
Reason for Consult: Abdominal pain and abnormal CAT scan Referring Physician: Hospital team  Madeline Harper is an 67 y.o. female.  HPI: Patient seen and examined and discussed with the hospital team as well as the patient and multiple family members hospital computer chart was reviewed and she is had no GI problems in the past but is due for colonoscopy based on her history of polyps and she was diagnosed about 2 years ago with an autoimmune problem no GI problems or other autoimmune problems run in the family and she was better on the blood thinners for her splenic infarct and they stopped those on Friday for possible kidney biopsy and her spleen pain has worsened since she had acute onset of midepigastric pain which was probably ischemic in her jejunum as per CT scan and I was consulted for further work-up and plans  Past Medical History:  Diagnosis Date  . Anemia   . Autoimmune disease ([place])   . Bone spur of foot   . Cataract   . DDD (degenerative disc disease), cervical 01/26/2017  . History of kidney stones   . Proteinuria 02/09/2018   Pt of [place] kidney.  ?lupus nephritis   . Splenic infarct     Past Surgical History:  Procedure Laterality Date  . CATARACT EXTRACTION, BILATERAL    . FOOT SURGERY    . [place]  . TEE WITHOUT CARDIOVERSION N/A 02/14/2018   Procedure: TRANSESOPHAGEAL ECHOCARDIOGRAM (TEE);  Surgeon: Skeet Latch, MD;  Location: [facility];  Service: Cardiovascular;  Laterality: N/A;  . TUBAL LIGATION  1970    Family History  Problem Relation Age of Onset  . Diabetes Mother   . Heart disease Mother   . Kidney failure Mother   . Cancer Mother        Uterine Cancer  . Diabetes Maternal Grandmother   . Kidney failure Maternal Grandmother   . Stroke Sister   . Gout Maternal Grandfather   . Breast cancer Neg Hx     Social History:  reports that she quit smoking about 37 years ago. Her smoking use included cigarettes. She has a 30.00  pack-year smoking history. She has never used smokeless tobacco. She reports that she drinks alcohol. She reports that she does not use drugs.  Allergies:  Allergies  Allergen Reactions  . Lyrica [Pregabalin] Swelling    Pedal edema     Medications: I have reviewed the patient's current medications.  Results for orders placed or performed during the hospital encounter of 03/04/2018 (from the past 48 hour(s))  Heparin level (unfractionated)     Status: None   Collection Time: 02/15/18  2:37 PM  Result Value Ref Range   Heparin Unfractionated 0.47 0.30 - 0.70 IU/mL    Comment: (NOTE) If heparin results are below expected values, and patient dosage has  been confirmed, suggest follow up testing of antithrombin III levels. Performed at [facility], [place] [address], [place], [place] [address]   Protime-INR     Status: None   Collection Time: 02/15/18  2:37 PM  Result Value Ref Range   Prothrombin Time 13.3 11.4 - 15.2 seconds   INR 1.02     Comment: Performed at [facility] [address] [place],  [address]  APTT     Status: Abnormal   Collection Time: 02/15/18  2:37 PM  Result Value Ref Range   aPTT 54 (H) 24 - 36 seconds    Comment:  IF BASELINE aPTT IS ELEVATED, SUGGEST PATIENT RISK ASSESSMENT BE USED TO DETERMINE APPROPRIATE ANTICOAGULANT THERAPY. Performed at [facility], [place] [address], [place], [place] [address]   Glucose, capillary     Status: None   Collection Time: 02/15/18  4:47 PM  Result Value Ref Range   Glucose-Capillary 88 70 - 99 mg/dL  Renal function panel     Status: Abnormal   Collection Time: 02/16/18  4:31 AM  Result Value Ref Range   Sodium 139 135 - 145 mmol/L   Potassium 3.6 3.5 - 5.1 mmol/L   Chloride 111 98 - 111 mmol/L   CO2 21 (L) 22 - 32 mmol/L   Glucose, Bld 131 (H) 70 - 99 mg/dL   BUN 17 8 - 23 mg/dL   Creatinine, Ser 1.57 (H) 0.44 - 1.00 mg/dL   Calcium 8.4 (L) 8.9 - 10.3 mg/dL   Phosphorus 4.5 2.5 -  4.6 mg/dL   Albumin 1.8 (L) 3.5 - 5.0 g/dL   GFR calc non Af Amer 33 (L) >60 mL/min   GFR calc Af Amer 39 (L) >60 mL/min    Comment: (NOTE) The eGFR has been calculated using the CKD EPI equation. This calculation has not been validated in all clinical situations. eGFR's persistently <60 mL/min signify possible Chronic Kidney Disease.    Anion gap 7 5 - 15    Comment: Performed at [facility] [address] [place], [place] [address]  Heparin level (unfractionated)     Status: Abnormal   Collection Time: 02/16/18  4:31 AM  Result Value Ref Range   Heparin Unfractionated 0.80 (H) 0.30 - 0.70 IU/mL    Comment: (NOTE) If heparin results are below expected values, and patient dosage has  been confirmed, suggest follow up testing of antithrombin III levels. Performed at [facility], [place] [address], [place], [place] [address]   CBC     Status: Abnormal   Collection Time: 02/16/18  4:31 AM  Result Value Ref Range   WBC 4.2 4.0 - 10.5 K/uL   RBC 3.62 (L) 3.87 - 5.11 MIL/uL   Hemoglobin 9.5 (L) 12.0 - 15.0 g/dL   HCT 32.4 (L) 36.0 - 46.0 %   MCV 89.5 80.0 - 100.0 fL   MCH 26.2 26.0 - 34.0 pg   MCHC 29.3 (L) 30.0 - 36.0 g/dL   RDW 14.9 11.5 - 15.5 %   Platelets 261 150 - 400 K/uL   nRBC 0.0 0.0 - 0.2 %    Comment: Performed at [facility], [place] [address], [place], [place] [address]  Hepatic function panel     Status: Abnormal   Collection Time: 02/16/18  4:31 AM  Result Value Ref Range   Total Protein 7.7 6.5 - 8.1 g/dL   Albumin 1.8 (L) 3.5 - 5.0 g/dL   AST 57 (H) 15 - 41 U/L   ALT 48 (H) 0 - 44 U/L   Alkaline Phosphatase 78 38 - 126 U/L   Total Bilirubin 0.4 0.3 - 1.2 mg/dL   Bilirubin, Direct <0.1 0.0 - 0.2 mg/dL   Indirect Bilirubin NOT CALCULATED 0.3 - 0.9 mg/dL    Comment: Performed at [facility] [address] [place], [place] [address]  APTT     Status: Abnormal   Collection Time: 02/16/18  4:31 AM  Result Value Ref Range   aPTT 69 (H)  24 - 36 seconds    Comment:  IF BASELINE aPTT IS ELEVATED, SUGGEST PATIENT RISK ASSESSMENT BE USED TO DETERMINE APPROPRIATE ANTICOAGULANT THERAPY. Performed at [facility], [place] [address] [place], [place] [address]   Lipase, blood     Status: None   Collection Time: 02/16/18  4:31 AM  Result Value Ref Range   Lipase 27 11 - 51 U/L    Comment: Performed at [facility] [address] [place], [place] [address]  Renal function panel     Status: Abnormal   Collection Time: 02/17/18  6:03 AM  Result Value Ref Range   Sodium 139 135 - 145 mmol/L   Potassium 4.1 3.5 - 5.1 mmol/L   Chloride 111 98 - 111 mmol/L   CO2 24 22 - 32 mmol/L   Glucose, Bld 120 (H) 70 - 99 mg/dL   BUN 22 8 - 23 mg/dL   Creatinine, Ser 1.88 (H) 0.44 - 1.00 mg/dL   Calcium 8.2 (L) 8.9 - 10.3 mg/dL   Phosphorus 4.5 2.5 - 4.6 mg/dL   Albumin 1.8 (L) 3.5 - 5.0 g/dL   GFR calc non Af Amer 27 (L) >60 mL/min   GFR calc Af Amer 31 (L) >60 mL/min    Comment: (NOTE) The eGFR has been calculated using the CKD EPI equation. This calculation has not been validated in all clinical situations. eGFR's persistently <60 mL/min signify possible Chronic Kidney Disease.    Anion gap 4 (L) 5 - 15    Comment: Performed at [facility] [address] [place], [place] [address]  CBC with Differential/Platelet     Status: Abnormal   Collection Time: 02/17/18  6:03 AM  Result Value Ref Range   WBC 7.9 4.0 - 10.5 K/uL   RBC 3.40 (L) 3.87 - 5.11 MIL/uL   Hemoglobin 9.3 (L) 12.0 - 15.0 g/dL   HCT 30.8 (L) 36.0 - 46.0 %   MCV 90.6 80.0 - 100.0 fL   MCH 27.4 26.0 - 34.0 pg   MCHC 30.2 30.0 - 36.0 g/dL   RDW 15.5 11.5 - 15.5 %   Platelets 230 150 - 400 K/uL   nRBC 0.3 (H) 0.0 - 0.2 %   Neutrophils Relative % 82 %   Neutro Abs 6.5 1.7 - 7.7 K/uL   Lymphocytes Relative 8 %   Lymphs Abs 0.6 (L) 0.7 - 4.0 K/uL   Monocytes Relative 9 %   Monocytes Absolute 0.7 0.1 - 1.0 K/uL   Eosinophils Relative 0 %    Eosinophils Absolute 0.0 0.0 - 0.5 K/uL   Basophils Relative 0 %   Basophils Absolute 0.0 0.0 - 0.1 K/uL   Immature Granulocytes 1 %   Abs Immature Granulocytes 0.07 0.00 - 0.07 K/uL    Comment: Performed at [facility], [place] [address], [place], [place] [address]    Ct Abdomen Pelvis Wo Contrast  Addendum Date: 02/17/2018   ADDENDUM REPORT: 02/17/2018 03:01 ADDENDUM: Findings discussed with Arby Barrette, NP. Electronically Signed   By: Dorise Bullion III M.D   On: 02/17/2018 03:01   Result Date: 02/17/2018 CLINICAL DATA:  Upper abdominal pain for 24 hours. Nausea and vomiting. EXAM: CT ABDOMEN AND PELVIS WITHOUT CONTRAST TECHNIQUE: Multidetector CT imaging of the abdomen and pelvis was performed following the standard protocol without IV contrast. COMPARISON:  None. FINDINGS: Lower chest: There is a new left effusion. There is opacity associated with the effusion which could represent atelectasis or pneumonia. Fibrotic changes in the bases. No other  abnormalities in the lower chest. Hepatobiliary: The liver is normal. The gallbladder is distended but this is a stable finding. Pancreas: Unremarkable. No pancreatic ductal dilatation or surrounding inflammatory changes. Spleen: The splenic infarcts described on the comparison study are much less conspicuous on this unenhanced study but do persist. Adrenals/Urinary Tract: Adrenal glands are normal. No hydronephrosis or perinephric stranding. A cyst is seen in the left kidney. No ureterectasis or ureteral stones. The bladder is normal. Stomach/Bowel: The stomach is normal. There is a markedly abnormal loop of proximal jejunum as seen on series 3, image 30 which appears edematous and thick walled. This abnormal loop extends into the left lower quadrant before returning to normal caliber. The remainder of the small bowel is normal. The colon is unremarkable. The appendix is normal in appearance. Vascular/Lymphatic: Atherosclerotic changes in  the nonaneurysmal aorta. No change in lymph nodes. Reproductive: Uterus and bilateral adnexa are unremarkable. Other: New ascites in the abdomen.  No free air. Musculoskeletal: No acute or significant osseous findings. IMPRESSION: 1. Markedly abnormal loop of proximal jejunum extending into the mid jejunum with an edematous appearance. The wall is markedly thickened. Findings are consistent with indeterminate enteritis which could be ischemic, infectious, or inflammatory. Given the possibility of embolic disease on the recent comparison, ischemic enteritis should be considered. 2. New left effusion. 3. The opacity underlying the new left effusion could represent atelectasis or pneumonia. 4. Gallbladder is distended but stable. Ultrasound could better evaluate if warranted. 5. Atherosclerosis in the nonaneurysmal aorta. 6. Suspected splenic infarcts again noted. Findings being called to the referring clinical team. Electronically Signed: By: Dorise Bullion III M.D On: 02/17/2018 01:39   Dg Abd Portable 1v  Result Date: 02/16/2018 CLINICAL DATA:  Acute onset of epigastric abdominal pain. EXAM: PORTABLE ABDOMEN - 1 VIEW COMPARISON:  CT of the abdomen and pelvis performed 03/01/2018 FINDINGS: The visualized bowel gas pattern is unremarkable. Scattered air and stool filled loops of colon are seen; no abnormal dilatation of small bowel loops is seen to suggest small bowel obstruction. No free intra-abdominal air is identified, though evaluation for free air is limited on a single supine view. The visualized osseous structures are within normal limits; the sacroiliac joints are unremarkable in appearance. IMPRESSION: Unremarkable bowel gas pattern; no free intra-abdominal air seen. Electronically Signed   By: Garald Balding M.D.   On: 02/16/2018 05:00    ROS negative except above she denies any lower bowel issues at home or any upper tract symptoms either Blood pressure (!) 182/92, pulse (!) 108, temperature 98  F (36.7 C), temperature source Oral, resp. rate 18, height 4' 11"  (1.499 m), weight 58.2 kg, SpO2 94 %. Physical Exam stable afebrile no acute distress exam pertinent for upper abdominal pain without rebound soft otherwise nontender occasional bowel sounds CT reviewed minimal elevated transaminases lipase normal probably anemia of chronic disease previous colonoscopy reviewed  Assessment/Plan: Autoimmune problem now with splenic infarct and probable small bowel ischemia Plan: Agree with resuming blood thinners consider a discussion with IR regarding angiogram and possible local anticoagulation if needed and would ask rheumatology to see regarding changing her medicines since her probable coagulation problem is autoimmune and not currently controlled by current regimen will allow clear liquids to try and probably does not need both H2 blockers and pump inhibitors and will check on tomorrow and call me if any specific question I could help with [facility] 02/17/2018, 10:36 AM

## 2018-02-18 ENCOUNTER — Inpatient Hospital Stay (HOSPITAL_COMMUNITY): Payer: Medicare Other

## 2018-02-18 DIAGNOSIS — J988 Other specified respiratory disorders: Secondary | ICD-10-CM

## 2018-02-18 DIAGNOSIS — R220 Localized swelling, mass and lump, head: Secondary | ICD-10-CM

## 2018-02-18 DIAGNOSIS — J8410 Pulmonary fibrosis, unspecified: Secondary | ICD-10-CM

## 2018-02-18 DIAGNOSIS — T783XXA Angioneurotic edema, initial encounter: Secondary | ICD-10-CM

## 2018-02-18 DIAGNOSIS — J9601 Acute respiratory failure with hypoxia: Secondary | ICD-10-CM

## 2018-02-18 DIAGNOSIS — K559 Vascular disorder of intestine, unspecified: Secondary | ICD-10-CM

## 2018-02-18 LAB — POCT I-STAT 3, ART BLOOD GAS (G3+)
Acid-base deficit: 3 mmol/L — ABNORMAL HIGH (ref 0.0–2.0)
BICARBONATE: 23.1 mmol/L (ref 20.0–28.0)
O2 Saturation: 100 %
PO2 ART: 476 mmHg — AB (ref 83.0–108.0)
Patient temperature: 99.1
TCO2: 25 mmol/L (ref 22–32)
pCO2 arterial: 46.1 mmHg (ref 32.0–48.0)
pH, Arterial: 7.31 — ABNORMAL LOW (ref 7.350–7.450)

## 2018-02-18 LAB — COMPREHENSIVE METABOLIC PANEL
ALT: 23 U/L (ref 0–44)
AST: 25 U/L (ref 15–41)
Albumin: 1.7 g/dL — ABNORMAL LOW (ref 3.5–5.0)
Alkaline Phosphatase: 68 U/L (ref 38–126)
Anion gap: 5 (ref 5–15)
BUN: 26 mg/dL — ABNORMAL HIGH (ref 8–23)
CHLORIDE: 114 mmol/L — AB (ref 98–111)
CO2: 20 mmol/L — AB (ref 22–32)
Calcium: 8.3 mg/dL — ABNORMAL LOW (ref 8.9–10.3)
Creatinine, Ser: 1.89 mg/dL — ABNORMAL HIGH (ref 0.44–1.00)
GFR calc Af Amer: 31 mL/min — ABNORMAL LOW (ref >60)
GFR, EST NON AFRICAN AMERICAN: 27 mL/min — AB (ref >60)
Glucose, Bld: 123 mg/dL — ABNORMAL HIGH (ref 70–99)
POTASSIUM: 3.9 mmol/L (ref 3.5–5.1)
SODIUM: 139 mmol/L (ref 135–145)
Total Bilirubin: 0.4 mg/dL (ref 0.3–1.2)
Total Protein: 6.9 g/dL (ref 6.5–8.1)

## 2018-02-18 LAB — CBC
HEMATOCRIT: 28.8 % — AB (ref 36.0–46.0)
HEMOGLOBIN: 8.6 g/dL — AB (ref 12.0–15.0)
MCH: 26.9 pg (ref 26.0–34.0)
MCHC: 29.9 g/dL — ABNORMAL LOW (ref 30.0–36.0)
MCV: 90 fL (ref 80.0–100.0)
Platelets: 198 10*3/uL (ref 150–400)
RBC: 3.2 MIL/uL — ABNORMAL LOW (ref 3.87–5.11)
RDW: 15.9 % — ABNORMAL HIGH (ref 11.5–15.5)
WBC: 10.6 10*3/uL — AB (ref 4.0–10.5)
nRBC: 0.2 % (ref 0.0–0.2)

## 2018-02-18 LAB — MRSA PCR SCREENING: MRSA by PCR: NEGATIVE

## 2018-02-18 LAB — GLUCOSE, CAPILLARY
Glucose-Capillary: 112 mg/dL — ABNORMAL HIGH (ref 70–99)
Glucose-Capillary: 116 mg/dL — ABNORMAL HIGH (ref 70–99)

## 2018-02-18 LAB — HEPARIN LEVEL (UNFRACTIONATED): HEPARIN UNFRACTIONATED: 0.55 [IU]/mL (ref 0.30–0.70)

## 2018-02-18 LAB — TRIGLYCERIDES: TRIGLYCERIDES: 148 mg/dL (ref <150)

## 2018-02-18 LAB — PROCALCITONIN: PROCALCITONIN: 0.22 ng/mL

## 2018-02-18 MED ORDER — EPINEPHRINE 0.3 MG/0.3ML IJ SOAJ
0.3000 mg | Freq: Once | INTRAMUSCULAR | Status: AC
  Administered 2018-02-18: 0.3 mg via INTRAMUSCULAR
  Filled 2018-02-18: qty 0.3

## 2018-02-18 MED ORDER — EPINEPHRINE (ANAPHYLAXIS) 1 MG/ML IJ SOLN
0.3000 mg | Freq: Once | INTRAMUSCULAR | Status: DC
  Filled 2018-02-18 (×2): qty 0.3

## 2018-02-18 MED ORDER — FENTANYL CITRATE (PF) 100 MCG/2ML IJ SOLN: 100.0000 ug | INTRAMUSCULAR | Status: DC | PRN

## 2018-02-18 MED ORDER — DIPHENHYDRAMINE HCL 50 MG/ML IJ SOLN
25.0000 mg | Freq: Four times a day (QID) | INTRAMUSCULAR | Status: DC
  Administered 2018-02-18: 25 mg via INTRAVENOUS
  Filled 2018-02-18: qty 1

## 2018-02-18 MED ORDER — FENTANYL 2500MCG IN NS 250ML (10MCG/ML) PREMIX INFUSION
25.0000 ug/h | INTRAVENOUS | Status: DC
  Administered 2018-02-18: 50 ug/h via INTRAVENOUS
  Administered 2018-02-19: 75 ug/h via INTRAVENOUS
  Administered 2018-02-20: 150 ug/h via INTRAVENOUS
  Administered 2018-02-21 – 2018-02-22 (×2): 200 ug/h via INTRAVENOUS
  Administered 2018-02-22: 225 ug/h via INTRAVENOUS
  Administered 2018-02-23 (×3): 250 ug/h via INTRAVENOUS
  Administered 2018-02-24: 150 ug/h via INTRAVENOUS
  Filled 2018-02-18 (×10): qty 250

## 2018-02-18 MED ORDER — PROPOFOL 1000 MG/100ML IV EMUL
0.0000 ug/kg/min | INTRAVENOUS | Status: DC
  Administered 2018-02-18: 30 ug/kg/min via INTRAVENOUS
  Administered 2018-02-18: 40 ug/kg/min via INTRAVENOUS
  Administered 2018-02-19: 25 ug/kg/min via INTRAVENOUS
  Administered 2018-02-19: 15 ug/kg/min via INTRAVENOUS
  Administered 2018-02-20: 10 ug/kg/min via INTRAVENOUS
  Administered 2018-02-20 – 2018-02-22 (×7): 50 ug/kg/min via INTRAVENOUS
  Administered 2018-02-22: 40 ug/kg/min via INTRAVENOUS
  Administered 2018-02-22 (×2): 50 ug/kg/min via INTRAVENOUS
  Administered 2018-02-23 (×2): 45 ug/kg/min via INTRAVENOUS
  Administered 2018-02-23 (×2): 50 ug/kg/min via INTRAVENOUS
  Administered 2018-02-24: 45 ug/kg/min via INTRAVENOUS
  Filled 2018-02-18 (×20): qty 100

## 2018-02-18 MED ORDER — ORAL CARE MOUTH RINSE
15.0000 mL | OROMUCOSAL | Status: DC
  Administered 2018-02-18 – 2018-02-24 (×58): 15 mL via OROMUCOSAL

## 2018-02-18 MED ORDER — FAMOTIDINE IN NACL 20-0.9 MG/50ML-% IV SOLN
20.0000 mg | Freq: Two times a day (BID) | INTRAVENOUS | Status: DC
  Administered 2018-02-18 – 2018-02-19 (×2): 20 mg via INTRAVENOUS
  Filled 2018-02-18 (×2): qty 50

## 2018-02-18 MED ORDER — DIPHENHYDRAMINE HCL 50 MG/ML IJ SOLN
25.0000 mg | Freq: Three times a day (TID) | INTRAMUSCULAR | Status: DC
  Administered 2018-02-18 – 2018-02-20 (×6): 25 mg via INTRAVENOUS
  Filled 2018-02-18 (×6): qty 1

## 2018-02-18 MED ORDER — EPINEPHRINE PF 1 MG/10ML IJ SOSY
PREFILLED_SYRINGE | INTRAMUSCULAR | Status: AC
  Filled 2018-02-18: qty 10

## 2018-02-18 MED ORDER — DEXAMETHASONE SODIUM PHOSPHATE 10 MG/ML IJ SOLN
4.0000 mg | Freq: Four times a day (QID) | INTRAMUSCULAR | Status: DC
  Administered 2018-02-18 – 2018-02-19 (×3): 4 mg via INTRAVENOUS
  Filled 2018-02-18 (×4): qty 0.4

## 2018-02-18 MED ORDER — INSULIN ASPART 100 UNIT/ML ~~LOC~~ SOLN
0.0000 [IU] | SUBCUTANEOUS | Status: DC
  Administered 2018-02-21 – 2018-02-24 (×7): 1 [IU] via SUBCUTANEOUS

## 2018-02-18 MED ORDER — DIPHENHYDRAMINE HCL 25 MG PO CAPS
25.0000 mg | ORAL_CAPSULE | Freq: Two times a day (BID) | ORAL | Status: DC
  Filled 2018-02-18: qty 1

## 2018-02-18 MED ORDER — CHLORHEXIDINE GLUCONATE 0.12% ORAL RINSE (MEDLINE KIT)
15.0000 mL | Freq: Two times a day (BID) | OROMUCOSAL | Status: DC
  Administered 2018-02-18 – 2018-02-24 (×12): 15 mL via OROMUCOSAL

## 2018-02-18 MED ORDER — HEPARIN (PORCINE) 25000 UT/250ML-% IV SOLN
700.0000 [IU]/h | INTRAVENOUS | Status: DC
  Administered 2018-02-18: 1000 [IU]/h via INTRAVENOUS
  Administered 2018-02-19 – 2018-02-23 (×5): 900 [IU]/h via INTRAVENOUS
  Administered 2018-02-24: 700 [IU]/h via INTRAVENOUS
  Filled 2018-02-18 (×4): qty 250

## 2018-02-18 MED ORDER — FENTANYL BOLUS VIA INFUSION
50.0000 ug | INTRAVENOUS | Status: DC | PRN
  Administered 2018-02-19 – 2018-02-20 (×4): 50 ug via INTRAVENOUS
  Filled 2018-02-18: qty 50

## 2018-02-18 MED ORDER — SODIUM CHLORIDE 0.9 % IV SOLN
INTRAVENOUS | Status: DC | PRN
  Administered 2018-02-18 – 2018-02-23 (×4): via INTRAVENOUS

## 2018-02-18 MED ORDER — FENTANYL CITRATE (PF) 100 MCG/2ML IJ SOLN: 50.0000 ug | Freq: Once | INTRAMUSCULAR | Status: DC

## 2018-02-18 MED ORDER — DIPHENHYDRAMINE HCL 50 MG/ML IJ SOLN
25.0000 mg | Freq: Two times a day (BID) | INTRAMUSCULAR | Status: DC
  Filled 2018-02-18: qty 1

## 2018-02-18 MED ORDER — METHYLPREDNISOLONE SODIUM SUCC 125 MG IJ SOLR
125.0000 mg | Freq: Once | INTRAMUSCULAR | Status: AC
  Administered 2018-02-18: 125 mg via INTRAVENOUS
  Filled 2018-02-18: qty 2

## 2018-02-18 NOTE — Progress Notes (Signed)
Patient seen twice today discussed with primary team first was during conversation with son and primary team and the second was as she was getting wheeled to the ICU for angioedema she looks very comfortable earlier today and her hospital computer chart was reviewed and I will check on tomorrow

## 2018-02-18 NOTE — Brief Op Note (Signed)
Late entry: 1509 pt intubated at bedside by  Dr. Tonia BroomsIcard for acute angioedema, time-out performed, pt received 2 Versed, 100 Fentanyl, 20 etomidate and 75 of succinylcholine for procedure, Pt tolerated well, positive color change and good lung sounds auscultated, Provider remained at bedside to monitor pt status and place orders, OG placed by provider at bedside  ETT and OG verified by X-Ray

## 2018-02-18 NOTE — Progress Notes (Signed)
ANTICOAGULATION CONSULT NOTE - Follow Up Consult  Pharmacy Consult for Heparin Indication: Splenic infarct  Patient Measurements: Height: 4\' 11"  (149.9 cm) Weight: 128 lb 4.9 oz (58.2 kg) IBW/kg (Calculated) : 43.2 Heparin Dosing Weight: 55.8 kg  Vital Signs: Temp: 99.1 F (37.3 C) (11/13 0915) Temp Source: Oral (11/13 0915) BP: 148/73 (11/13 0915) Pulse Rate: 122 (11/13 0915)  Labs: Recent Labs    02/15/18 1437  02/16/18 0431 02/17/18 0603 02/17/18 2138 02/18/18 0536  HGB  --    < > 9.5* 9.3*  --  8.6*  HCT  --   --  32.4* 30.8*  --  28.8*  PLT  --   --  261 230  --  198  APTT 54*  --  69*  --   --   --   LABPROT 13.3  --   --   --   --   --   INR 1.02  --   --   --   --   --   HEPARINUNFRC 0.47  --  0.80*  --  0.58 0.55  CREATININE  --   --  1.57* 1.88*  --  1.89*   < > = values in this interval not displayed.    Estimated Creatinine Clearance: 22.7 mL/min (A) (by C-G formula based on SCr of 1.89 mg/dL (H)).   Medications:  Infusions:  . dextrose 5 % and 0.9% NaCl 50 mL/hr at 02/17/18 1603  . famotidine (PEPCID) IV 20 mg (02/17/18 1528)  . heparin 1,000 Units/hr (02/18/18 0316)    Assessment: [age] YOF who presented on 11/3 with L-flank pain and was found to have a splenic infarct and now with imaging showing ischemic colitis - currently evaluating for source. Pharmacy consulted to dose Heparin for anticoagulation.   Heparin level this morning is therapeutic (HL 0.55). Hgb/Hct slight drop, plts wnl - no active bleeding noted.   Goal of Therapy:  Heparin level 0.3-0.7 units/ml Monitor platelets by anticoagulation protocol: Yes   Plan:  - Continue Heparin at 1000 units/hr (10 ml/hr) - Will continue to follow work-up and evaluation and plans to transition to po anticoagulation - Will continue to monitor for any signs/symptoms of bleeding and will follow up with a heparin level in the AM.  Thank you for allowing pharmacy to be a part of this patient's  care.  Georgina PillionElizabeth Bodie Abernethy, PharmD, BCPS Clinical Pharmacist Pager: [phone] Clinical phone for 02/18/2018 from 7a-3:30p: [phone] If after 3:30p, please call main pharmacy at: [phone] Please check AMION for all [facility] numbers 02/18/2018 10:31 AM

## 2018-02-18 NOTE — Progress Notes (Signed)
Patient previously complaining of sore throat and inability to swallow. Placed on chloraseptic spray 11/12 and lidocaine viscous with a dose given this morning at 1123; which she was able to swallow < 1/5 of solution. During rounding at 1340, informed this RN that "throat feels swollen" and noted upon assessment. Dr. Caleb PoppNettey paged at 1342 and made aware 0. Orders received and carried out. Critical care team consulted. [facility] Madeline Harper, BSN, RN

## 2018-02-18 NOTE — Progress Notes (Signed)
ANTICOAGULATION CONSULT NOTE - Follow Up Consult  Pharmacy Consult for Heparin Indication: Splenic infarct  Patient Measurements: Height: 5' (152.4 cm) Weight: 128 lb 4.9 oz (58.2 kg) IBW/kg (Calculated) : 45.5 Heparin Dosing Weight: 55.8 kg  Vital Signs: Temp: 99.1 F (37.3 C) (11/13 0915) Temp Source: Oral (11/13 0915) BP: 167/111 (11/13 1510) Pulse Rate: 138 (11/13 1510)  Labs: Recent Labs    02/16/18 0431 02/17/18 0603 02/17/18 2138 02/18/18 0536  HGB 9.5* 9.3*  --  8.6*  HCT 32.4* 30.8*  --  28.8*  PLT 261 230  --  198  APTT 69*  --   --   --   HEPARINUNFRC 0.80*  --  0.58 0.55  CREATININE 1.57* 1.88*  --  1.89*    Estimated Creatinine Clearance: 23.4 mL/min (A) (by C-G formula based on SCr of 1.89 mg/dL (H)).   Medications:  Infusions:  . dextrose 5 % and 0.9% NaCl 50 mL/hr at 02/18/18 1332  . famotidine (PEPCID) IV    . fentaNYL infusion INTRAVENOUS 50 mcg/hr (02/18/18 1545)  . heparin 1,000 Units/hr (02/18/18 0316)  . propofol (DIPRIVAN) infusion 30 mcg/kg/min (02/18/18 1524)    Assessment: [age] YOF who presented on 11/3 with L-flank pain and was found to have a splenic infarct and now with imaging showing ischemic colitis - currently evaluating for source. Pharmacy consulted to dose Heparin for anticoagulation.   Transferred to 2H due to angioedema, now intubated. Heparin infusion was stopped upon arrival to unit. CT neck showing diffuse edema of pharynx and larynx consistent with angioedema and no indications of hematoma. Plan to resume heparin infusion at previously therapeutic rate. Hgb 8.6, plt 198. No s/sx of bleeding.  Goal of Therapy:  Heparin level 0.3-0.7 units/ml Monitor platelets by anticoagulation protocol: Yes   Plan:  -Restart heparin infusion at 1000 units/hr (10 ml/hr) - Will continue to follow work-up and evaluation and plans to transition to po anticoagulation - Will continue to monitor for any signs/symptoms of bleeding and will  follow up with a heparin level in the AM.  Thank you for allowing pharmacy to be a part of this patient's care.  Girard CooterKimberly Perkins, PharmD Clinical Pharmacist  Pager: [phone] Phone: [phone] Please check AMION for all [facility] numbers 02/18/2018 5:19 PM

## 2018-02-18 NOTE — Progress Notes (Deleted)
**  For educational purposes only** **This note is not to be entered in the chart**  CC: left sided pain  PMH SLE/MCTDz Recently started on lisin PTA for proteinuria  Vitals -  Vitals:   02/20/18 1200 02/20/18 1230  BP: (!) 127/57 (!) 115/55  Pulse: 64 63  Resp: 20 20  Temp:    SpO2: 100% 100%    Events: 11/13 emergent intubation s/p angioedema (from lidocaine??)  Renal: AKI/CKD in setting of splenic infarct, IV contrast and volume depletion with concomitant ACE-inhibition. - concern for lupus nephritis - but holding renal biopsy d/t being unable to hold heparin as new thoromboses SCr worsening 1.57> 1.89>2.73>4.34  SCr BL <1 UOP charted as anuric  Lytes  - K, Na, Mg wnl  - CorCa 9.7  - Phos up 4.5>7.0>8.8  Notable Labs  Recent Labs    02/18/18 0536 02/19/18 0259 02/20/18 0240  HGB 8.6* 7.1*  --   WBC 10.6* 10.6*  --   PLT 198 166  --   NA 139 140 141  K 3.9 4.6 5.1  MG  --  2.0  --   PHOS  --  7.0* 8.8*  CALCIUM 8.3* 7.7* 7.8*  ALBUMIN 1.7* 1.3* 1.6*  CREATININE 1.89* 2.73* 4.34*  --- Iron/TIBC/Ferritin/ %Sat    Component Value Date/Time   IRON 10 (L) 02/17/2018 1134   TIBC 113 (L) 02/15/2018 1134   FERRITIN 50 02/11/2018 1134   IRONPCTSAT 9 (L) 02/18/2018 1134   Treatment Feraheme x 1 on 11/6  Med Notes ---Holding  Lisinopril  Plaquenil, azathioprine ---Adjusted  Anticoag: hep gtt for splenic infarct - transitioned to apix 11/7 but developed new bowel ischemia - transitioned back to hep gtt 11/12 - need to hold for renal biopsy to confirm lupus nephritis, but hesitant to hold bc of ischemic bowel and spleen infarct - holding heparin gtt 11/15 @ 0830 for HD cath placement this morning - restarting heparin gtt 11/15 @ 1145   ID: no abx. 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 - monitor HD tolerance (planned 11/15) - watch Hgb, transfuse for Hgb < 7 per CCM  - monitor for s/sx bleeding since on hep gtt - watch CBGs sp steroids for angioedema - F/u plans to transition to po anticoagulation (failed apix- warfarin?) - f/u plans for renal biopsy to confirm lupus nephritis  - start cellcept? - Phos binder?

## 2018-02-18 NOTE — Consult Note (Signed)
NAME:  Madeline Harper, MRN:  [id number], DOB:  November 21, 1950, LOS: 9 ADMISSION DATE:  2018/03/05, CONSULTATION DATE:  02/18/2018 REFERRING MD:  Dr. 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  Additionally found to have ischemic bowel and placed on heparin gtt.  Developed acute angioedema 11/13 requiring emergent intubation.   History of present illness   HPI obtained from medical chart review as patient is intubated and sedated on mechanical ventilation.  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  Patient was found to have splenic infarct, etiology unclear thus far.  ID consulted, but no evidence of infectious etiology with negative blood cultures and TEE negative.  She was started on heparin gtt 11/3.  Given worsening renal function with proteinuria, nephrology consulted on 11/8 with plans for renal biopsy, however patient developed worsening abdominal pain on 11/10.  CT abd noted for possible ischemic enteritis of jejunum. GI is consulting as well. Given proteinuria with AKI, Nephrology following and at this time is holding on renal biopsy to rule out lupus nephritis.  Additionally, patient developed sore throat, reportedly since 11/12 and has been using viscious lidocaine.  On 11/13, patient found to have acute angioedema.  PCCM called to floor as rapid response.  Patient not responsive to epi, solumedrol, benadryl and unable to mange secretions.  Patient required emergent transfer to ICU and emergent intubation for airway protection.    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 Large subcapsular splenic infarct. Findings may be related to an embolic process possibly originating from the heart such as infective endocarditis. Clinical correlation is recommended. 2. Focal area of wall apparent thickening or thrombus in the midportion of the splenic artery with associated luminal narrowing.  3. Mildly enlarged retroperitoneal and right common iliac lymph nodes. 4. Sigmoid diverticulosis. No bowel obstruction or active inflammation. Normal appendix. 5. Possible small left ovarian dermoid.  11/5 TEE >> LVEF 55-60%, no evidence of IE, could not rule out small PFO  11/11 CT abd/pelvis >> 1. Markedly abnormal loop of proximal jejunum extending into the mid jejunum with an edematous appearance. The wall is markedly thickened. Findings c/w indeterminate enteritis which could be ischemic, infectious, or inflammatory.  2. New left effusion. 3. The opacity underlying the new left effusion could represent atelectasis or pneumonia. 4. Gallbladder is distended but stable.  5. Atherosclerosis in the nonaneurysmal aorta. 6. Suspected splenic infarcts again noted.  Micro Data:  11/3 BC x 3 >> negative 11/13 MRSA PCR >>  Antimicrobials:  none  Interim history/subjective:   Objective   Blood pressure (!) 148/73, pulse (!) 122, temperature 99.1 F (37.3 C), temperature source Oral, resp. rate 20, height 4\' 11"  (1.499 m), weight 58.2 kg, SpO2 99 %.        Intake/Output Summary (Last 24  hours) at 02/18/2018 1506 Last data filed at 02/18/2018 0900 Gross per 24 hour  Intake 1437.44 ml  Output -  Net 1437.44 ml   Filed Weights   03/05/2018 0350 02/17/18 0523  Weight: 59.9 kg 58.2 kg     Examination: General: adult female lying in bed with obvious tongue, neck swelling, drooling HEENT: MM pink/moist, drooling, unable to swallow prior to intubation, questionable purple bruising sublingual, increased swelling on left compared to right Neuro: Awake, alert, follows commands, MAE with normal strength  CV: s1s2 rrr, tachycardic, no m/r/g PULM: even/non-labored, lungs bilaterally coarse UJ:WJXB, non-tender, bsx4 active  Extremities: warm/dry, no peripheral edema  Skin: no rashes or lesions  Resolved Hospital Problem list     Assessment & Plan:   Angioedema / Emergent Airway Obstruction  -acute onset tongue/neck swelling 11/13.  Sore throat began 11/12 (was also started on heparin at that time due to splenic infarct) P: Decadron 4mg  IV Q6 Assess CT neck soft tissue to rule out hematoma  Pepcid 20 mg BID  Benadryl 25 Q8  Acute Respiratory Insufficiency in setting of Airway Obstruction  P: PRVC 8cc/kg Wean FiO2 for sats >90% RASS Goal: -4 to -5  Follow up ABG in one hour post intubation  CXR post intubation   Splenic Infarct  -concern for possible SLE / mixed connective tissue process -?small PFO on TTE P: GI following, appreciate input Continue heparin gtt   Abdominal Pain with Nausea / Vomiting  -CT concerning possible ischemic enteritis of the jejunum P: Unable to perform angio due to AKI  Heparin as above  AKI on CKD -recent IV contrast, volume depletion, ACE-I use P: Nephrology following, appreciate input Planned for renal biopsy 11/11 but placed on hold Trend BMP / urinary output Replace electrolytes as indicated Avoid nephrotoxic agents, ensure adequate renal perfusion  Iron Deficiency Anemia P: Trend CBC  Monitor for further bleeding   Fever  P: Follow fever curve / WBC trend  Assess PCT   Mixed Connective Tissue Disease / Possible SLE -followed by Dr. Lendon Colonel -on Plaquenil & Imuran at baseline -negative antiphospholipid,  lupus anticoagulant, beta-2 glycoprotein, LDH, uric acid, protein C/S, blood cultures x2 negative  P: Hold home plaquenil, imuran (stopped 11/11)   HTN P: Continue amlodipine Hold further ACE-I with angioedema  Best practice:  Diet: NPO Pain/Anxiety/Delirium protocol (if indicated): Propofol, fentanyl gtt's VAP protocol (if indicated): in place  DVT prophylaxis: Heparin gtt  GI prophylaxis: Pepcid  Glucose control: n/a, monitor while on steroids  Mobility: bedrest  Code Status: full code  Family Communication: Son updated at bedside  Disposition: ICU    Labs   CBC: Recent Labs  Lab 02/14/18 0525 02/15/18 0616 02/16/18 0431 02/17/18 0603 02/18/18 0536  WBC 3.1* 3.4* 4.2 7.9 10.6*  NEUTROABS 2.3 2.4  --  6.5  --   HGB 7.9* 8.4* 9.5* 9.3* 8.6*  HCT 27.5* 27.9* 32.4* 30.8* 28.8*  MCV 90.8 89.4 89.5 90.6 90.0  PLT 224 201 261 230 198    Basic Metabolic Panel: Recent Labs  Lab 02/12/18 0622 02/13/18 0518 02/14/18 0525 02/15/18 0616 02/16/18 0431 02/17/18 0603 02/18/18 0536  NA 136 139 141 141 139 139 139  K 4.4 4.0 3.8 3.6 3.6 4.1 3.9  CL 110 113* 114* 113* 111 111 114*  CO2 20* 23 20* 23 21* 24 20*  GLUCOSE 77 88 87 91 131* 120* 123*  BUN 27* 23 20 17 17 22  26*  CREATININE 2.18* 2.05* 1.71* 1.69* 1.57* 1.88*  1.89*  CALCIUM 8.0* 8.4* 8.0* 8.3* 8.4* 8.2* 8.3*  PHOS 3.9 4.1  --   --  4.5 4.5  --    GFR: Estimated Creatinine Clearance: 22.7 mL/min (A) (by C-G formula based on SCr of 1.89 mg/dL (H)). Recent Labs  Lab 02/15/18 0616 02/16/18 0431 02/17/18 0603 02/18/18 0536  WBC 3.4* 4.2 7.9 10.6*    Liver Function Tests: Recent Labs  Lab 02/12/18 0622 02/13/18 0518 02/16/18 0431 02/17/18 0603 02/18/18 0536  AST  --   --  57*  --  25  ALT  --   --  48*  --  23  ALKPHOS  --   --  78  --  68  BILITOT  --   --  0.4  --  0.4  PROT  --   --  7.7  --  6.9  ALBUMIN 1.4* 1.4* 1.8*  1.8* 1.8* 1.7*   Recent Labs  Lab 02/16/18 0431  LIPASE 27    No results for input(s): AMMONIA in the last 168 hours.  ABG No results found for: PHART, PCO2ART, PO2ART, HCO3, TCO2, ACIDBASEDEF, O2SAT   Coagulation Profile: Recent Labs  Lab 02/15/18 1437  INR 1.02    Cardiac Enzymes: No results for input(s): CKTOTAL, CKMB, CKMBINDEX, TROPONINI in the last 168 hours.  HbA1C: No results found for: HGBA1C  CBG: Recent Labs  Lab 02/15/18 1647  GLUCAP 88    Review of Systems:   Unable   Past Medical History  She,  has a past medical history of Anemia, Autoimmune disease (HCC), Bone spur of foot, Cataract, DDD (degenerative disc disease), cervical (01/26/2017), History of kidney stones, Proteinuria (02/09/2018), and Splenic infarct.   Surgical History    Past Surgical History:  Procedure Laterality Date  . CATARACT EXTRACTION, BILATERAL    . FOOT SURGERY    . KIDNEY STONE SURGERY  1972  . TEE WITHOUT CARDIOVERSION N/A 02/14/2018   Procedure: TRANSESOPHAGEAL ECHOCARDIOGRAM (TEE);  Surgeon: Chilton Si, MD;  Location: [facility] ENDOSCOPY;  Service: Cardiovascular;  Laterality: N/A;  . TUBAL LIGATION  1970     Social History   reports that she quit smoking about 37 years ago. Her smoking use included cigarettes. She has a 30.00 pack-year smoking history. She has never used smokeless tobacco. She reports that she drinks alcohol. She reports that she does not use drugs.   Family History   Her family history includes Cancer in her mother; Diabetes in her maternal grandmother and mother; Gout in her maternal grandfather; Heart disease in her mother; Kidney failure in her maternal grandmother and mother; Stroke in her sister. There is no history of Breast cancer.   Allergies Allergies  Allergen Reactions  . Lyrica [Pregabalin] Swelling    Pedal edema      Home Medications  Prior to Admission medications   Medication Sig Start Date End Date Taking? Authorizing Provider  acetaminophen (TYLENOL 8 HOUR ARTHRITIS PAIN) 650 MG CR tablet  Take 650 mg by mouth every 8 (eight) hours as needed for pain.   Yes [provider]  azaTHIOprine (IMURAN) 50 MG tablet Take 2 tablets (100 mg total) by mouth daily. 11/05/17  Yes Copland, Gwenlyn Found, MD  calcium carbonate (CALCIUM 600) 600 MG TABS tablet Take 600 mg by mouth daily.   Yes [provider]  Ergocalciferol (VITAMIN D2 PO) Take 2,000 mg by mouth daily.    Yes [provider]  hydroxychloroquine (PLAQUENIL) 200 MG tablet Take 300 mg by mouth daily.  12/11/16  Yes [provider]  lisinopril (PRINIVIL,ZESTRIL) 5 MG tablet Take 5 mg by mouth daily. 01/30/18  Yes [provider]  Multiple Vitamins-Minerals (MULTI ADULT GUMMIES PO) Take 1 tablet by mouth daily.   Yes [provider]  Pyridoxine HCl (VITAMIN B-6) 500 MG tablet Take 500 mg by mouth daily.   Yes [provider]  vitamin B-12 (CYANOCOBALAMIN) 1000 MCG tablet Take 1,000 mcg by mouth daily.   Yes [provider]  ALPRAZolam Prudy Feeler(XANAX) 0.25 MG tablet Take up to bid as needed for flying Patient not taking: Reported on 01/07/2018 11/05/17   Copland, Gwenlyn FoundJessica C, MD  ELIQUIS STARTER PACK (ELIQUIS STARTER PACK) 5 MG TABS Take as directed on package: start with two-5mg  tablets twice daily for 7 days. On day 8, switch to one-5mg  tablet twice daily. 02/13/18   Rhetta MuraSamtani, Jai-Gurmukh, MD  naproxen (NAPROSYN) 500 MG tablet Take 1 tablet (500 mg total) by mouth 2 (two) times daily. Limit us to 3-5 days Patient not taking: Reported on 03/01/2018 05/19/17   Shon BatonHorton, Courtney F, MD     Critical care time: 60 mins    Posey BoyerBrooke Emerick Weatherly, [place]NP-BC [facility] Pgr: [phone] or if no answer [phone] 02/18/2018, 4:09 PM

## 2018-02-18 NOTE — Progress Notes (Signed)
Called at 1437 by PP for patient needing ICU bed - arrived to the floor around 1445 - PCCM MD's and Dr. Mal MistyNetty at bedside - patient with sudden onset tongue and neck swelling - patient alert and following commands - maintaining airway at this point - Epi pen at bedside - administered 0.3mg  IM left thigh per order- has already received Solumedrol and Benadryl - MD's requesting urgent transfer to ICU - ambu bag, oral airway and nasal airway obtained - RT at bedside - Tele monitoring ST - O2 - stat transfer to 2H14 with RT, PCCM MD and myself.  Tol well - son present and updated.  Handoff to ICU staff.

## 2018-02-18 NOTE — Progress Notes (Signed)
S: Feels a little better but complaining of a sore throat O:BP (!) 148/73 (BP Location: Left Arm)   Pulse (!) 122   Temp 99.1 F (37.3 C) (Oral)   Resp 20   Ht 4\' 11"  (1.499 m)   Wt 58.2 kg   SpO2 99%   BMI 25.91 kg/m   Intake/Output Summary (Last 24 hours) at 02/18/2018 1204 Last data filed at 02/18/2018 0900 Gross per 24 hour  Intake 1466.77 ml  Output -  Net 1466.77 ml   Intake/Output: I/O last 3 completed shifts: In: 2260.1 [I.V.:2163.1; IV Piggyback:97.1] Out: 0   Intake/Output this shift:  No intake/output data recorded. Weight change:  Gen: NAD CVS: tachy Resp: cta Abd: +BS, soft, mild diffuse tenderness, no guarding or rebound Ext: no edema  Recent Labs  Lab 02/12/18 0622 02/13/18 0518 02/14/18 0525 02/15/18 0616 02/16/18 0431 02/17/18 0603 02/18/18 0536  NA 136 139 141 141 139 139 139  K 4.4 4.0 3.8 3.6 3.6 4.1 3.9  CL 110 113* 114* 113* 111 111 114*  CO2 20* 23 20* 23 21* 24 20*  GLUCOSE 77 88 87 91 131* 120* 123*  BUN 27* 23 20 17 17 22  26*  CREATININE 2.18* 2.05* 1.71* 1.69* 1.57* 1.88* 1.89*  ALBUMIN 1.4* 1.4*  --   --  1.8*  1.8* 1.8* 1.7*  CALCIUM 8.0* 8.4* 8.0* 8.3* 8.4* 8.2* 8.3*  PHOS 3.9 4.1  --   --  4.5 4.5  --   AST  --   --   --   --  57*  --  25  ALT  --   --   --   --  48*  --  23   Liver Function Tests: Recent Labs  Lab 02/16/18 0431 02/17/18 0603 02/18/18 0536  AST 57*  --  25  ALT 48*  --  23  ALKPHOS 78  --  68  BILITOT 0.4  --  0.4  PROT 7.7  --  6.9  ALBUMIN 1.8*  1.8* 1.8* 1.7*   Recent Labs  Lab 02/16/18 0431  LIPASE 27   No results for input(s): AMMONIA in the last 168 hours. CBC: Recent Labs  Lab 02/14/18 0525 02/15/18 0616 02/16/18 0431 02/17/18 0603 02/18/18 0536  WBC 3.1* 3.4* 4.2 7.9 10.6*  NEUTROABS 2.3 2.4  --  6.5  --   HGB 7.9* 8.4* 9.5* 9.3* 8.6*  HCT 27.5* 27.9* 32.4* 30.8* 28.8*  MCV 90.8 89.4 89.5 90.6 90.0  PLT 224 201 261 230 198   Cardiac Enzymes: No results for input(s):  CKTOTAL, CKMB, CKMBINDEX, TROPONINI in the last 168 hours. CBG: Recent Labs  Lab 02/15/18 1647  GLUCAP 88    Iron Studies: No results for input(s): IRON, TIBC, TRANSFERRIN, FERRITIN in the last 72 hours. Studies/Results: Ct Abdomen Pelvis Wo Contrast  Addendum Date: 02/17/2018   ADDENDUM REPORT: 02/17/2018 03:01 ADDENDUM: Findings discussed with Stevie Kern, NP. Electronically Signed   By: Gerome Sam III M.D   On: 02/17/2018 03:01   Result Date: 02/17/2018 CLINICAL DATA:  Upper abdominal pain for 24 hours. Nausea and vomiting. EXAM: CT ABDOMEN AND PELVIS WITHOUT CONTRAST TECHNIQUE: Multidetector CT imaging of the abdomen and pelvis was performed following the standard protocol without IV contrast. COMPARISON:  None. FINDINGS: Lower chest: There is a new left effusion. There is opacity associated with the effusion which could represent atelectasis or pneumonia. Fibrotic changes in the bases. No other abnormalities in the lower chest. Hepatobiliary: The liver  is normal. The gallbladder is distended but this is a stable finding. Pancreas: Unremarkable. No pancreatic ductal dilatation or surrounding inflammatory changes. Spleen: The splenic infarcts described on the comparison study are much less conspicuous on this unenhanced study but do persist. Adrenals/Urinary Tract: Adrenal glands are normal. No hydronephrosis or perinephric stranding. A cyst is seen in the left kidney. No ureterectasis or ureteral stones. The bladder is normal. Stomach/Bowel: The stomach is normal. There is a markedly abnormal loop of proximal jejunum as seen on series 3, image 30 which appears edematous and thick walled. This abnormal loop extends into the left lower quadrant before returning to normal caliber. The remainder of the small bowel is normal. The colon is unremarkable. The appendix is normal in appearance. Vascular/Lymphatic: Atherosclerotic changes in the nonaneurysmal aorta. No change in lymph nodes.  Reproductive: Uterus and bilateral adnexa are unremarkable. Other: New ascites in the abdomen.  No free air. Musculoskeletal: No acute or significant osseous findings. IMPRESSION: 1. Markedly abnormal loop of proximal jejunum extending into the mid jejunum with an edematous appearance. The wall is markedly thickened. Findings are consistent with indeterminate enteritis which could be ischemic, infectious, or inflammatory. Given the possibility of embolic disease on the recent comparison, ischemic enteritis should be considered. 2. New left effusion. 3. The opacity underlying the new left effusion could represent atelectasis or pneumonia. 4. Gallbladder is distended but stable. Ultrasound could better evaluate if warranted. 5. Atherosclerosis in the nonaneurysmal aorta. 6. Suspected splenic infarcts again noted. Findings being called to the referring clinical team. Electronically Signed: By: Gerome Samavid  Williams III M.D On: 02/17/2018 01:39   . pantoprazole (PROTONIX) IV  40 mg Intravenous Q12H  . scopolamine  1 patch Transdermal Q72H  . sodium chloride flush  3 mL Intravenous Q12H    BMET    Component Value Date/Time   NA 139 02/18/2018 0536   NA 138 12/20/2013   K 3.9 02/18/2018 0536   CL 114 (H) 02/18/2018 0536   CO2 20 (L) 02/18/2018 0536   GLUCOSE 123 (H) 02/18/2018 0536   BUN 26 (H) 02/18/2018 0536   BUN 20 12/20/2013   CREATININE 1.89 (H) 02/18/2018 0536   CREATININE 0.90 11/18/2016 1350   CALCIUM 8.3 (L) 02/18/2018 0536   GFRNONAA 27 (L) 02/18/2018 0536   GFRNONAA 67 11/18/2016 1350   GFRAA 31 (L) 02/18/2018 0536   GFRAA 78 11/18/2016 1350   CBC    Component Value Date/Time   WBC 10.6 (H) 02/18/2018 0536   RBC 3.20 (L) 02/18/2018 0536   HGB 8.6 (L) 02/18/2018 0536   HCT 28.8 (L) 02/18/2018 0536   PLT 198 02/18/2018 0536   MCV 90.0 02/18/2018 0536   MCH 26.9 02/18/2018 0536   MCHC 29.9 (L) 02/18/2018 0536   RDW 15.9 (H) 02/18/2018 0536   LYMPHSABS 0.6 (L) 02/17/2018 0603    MONOABS 0.7 02/17/2018 0603   EOSABS 0.0 02/17/2018 0603   BASOSABS 0.0 02/17/2018 0603    Assessment/Plan:  1. 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 Cr is slowly improving, however given abd pain and N/V will hold off on renal biopsy for now. Continue to hold lisinopril and follow renal function.  She does not want to change her immunosuppression as I discussed possibly changing to cellcept and prednisone.  Primary team to discuss with her Rheumatologist about other suggestions.  Renal biopsy still on hold due to her ischemic bowel off of heparin gtt.  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  Will recheck complement levels.  Cont to follow for now.  2. Abdominal pain with N/V- w/u with CT revealed possible ischemic enteritis of jejunum and GI has been consulted for further evaluation and management.  She is back on heparin and pain is intermittent but somewhat improved. 3. Splenic infarction, acute- is on IV heparin but was put on hold for biopsy, cont to hold for now.  Hypercoagulable workup negative thus far. 4. Proteinuria- as above, hold off on bx for now.  5. HTN- will start bp meds and follow (lisinopril on hold due to AKI/CKD). Will add amlodipine and may need clonidine prn.  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  Irena Cords, MD [employer] [phone]

## 2018-02-18 NOTE — Progress Notes (Signed)
PROGRESS NOTE    Madeline Harper  SHU:[id number] DOB: 09/14/1950 DOA: 02/23/2018 PCP: Darreld Mclean, MD   Brief Narrative: Madeline Harper is a 67 y.o. 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  Patient presented secondary to left flank pain.  Patient was found to have a splenic infarct of unknown etiology.  She then also developed enteritis concerning for ischemic colitis in setting of possible embolic infarcts.   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 Patient is on lisinopril as an outpatient but this has not been ordered while inpatient for the last 10 days. Heparin started yesterday. High concern for respiratory compromise. Patient unable to swallow. Able to breath but feels short of breath mildly. -stat solu-medrol 146m IV, benadryl IV, epi 0.35mIM, continue pepcid -Critical care consult for likely intubation -Stat vitals -Transfer to ICU  Splenic infarct Unknown etiology.  Patient with a history of rheumatologic disease which could be contributing.  Infectious work-up was negative.  So far, rheum work-up has been negative.  Emboli could be secondary to blood clots and patient was started on heparin drip. -Continue heparin drip with plans to transition to Eliquis  Abdominal pain Likely ischemic bowel in setting of above problem. -Heparin as mentioned above  Nausea and vomiting In setting of abdominal pain and likely ischemic gut.  Gastroenterology was consulted -GI recommendations  Fever Thought to be secondary to splenic infarct.  Acute kidney injury Renal ATN  versus contrast nephropathy in setting of recent CT with contrast.  Nephrology consulted. -Nephrology recommendations  Iron deficiency anemia Low ferritin and iron on recent iron panel.  Given IV Feraheme.    Pressure Injury Documentation:     DVT prophylaxis: Heparin drip Code Status:   Code Status: Full Code Family Communication: Son at bedside Disposition Plan: Transfer to ICU   Consultants:   Nephrology  Gastroenterology  Critical care medicine  Procedures:   None  Antimicrobials:  None    Subjective: Abdominal pain is controlled. Improved from previous days. Patient developed tongue swelling this afternoon that has progressively worsened. She I s  Objective: Vitals:   02/18/18 2030 02/18/18 2100 02/18/18 2130 02/18/18 2200  BP: 92/63 91/62 (!) 89/54 (!) 91/57  Pulse:      Resp: _0 Temp:      TempSrc:      SpO2:      Weight:      Height:        Intake/Output Summary (Last 24 hours) at 02/18/2018 2224 Last data filed at 02/18/2018 2100 Gross per 24 hour  Intake 1749.06 ml  Output -  Net 1749.06 ml   Filed Weights   02/17/2018 0350 02/17/18 0523  Weight: 59.9 kg 58.2 kg    Examination:  General exam: Appears calm and comfortable  HEENT: tongue is severely swollen and protruding form mouth. Significant swelling of left neck Respiratory system: Clear to auscultation. Respiratory effort normal. Cardiovascular system: S1 & S2 heard, RRR. No murmurs, rubs, gallops or clicks. Gastrointestinal system: Abdomen is nondistended, soft and mildly tender  in epigastric area. No organomegaly or masses felt. Normal bowel sounds heard. Central nervous system: Alert and oriented. No focal neurological deficits. Extremities: No edema. No calf tenderness Skin: No cyanosis. No rashes Psychiatry: Judgement and insight appear normal. Mood & affect appropriate.     Data Reviewed: I have personally reviewed following labs and imaging  studies  CBC: Recent Labs  Lab 02/14/18 0525 02/15/18 0616 02/16/18 0431 02/17/18 0603 02/18/18 0536  WBC 3.1* 3.4* 4.2 7.9 10.6*  NEUTROABS 2.3 2.4  --  6.5  --   HGB 7.9* 8.4* 9.5* 9.3* 8.6*  HCT 27.5* 27.9* 32.4* 30.8* 28.8*  MCV 90.8 89.4 89.5 90.6 90.0  PLT 224 201 261 230 975   Basic Metabolic Panel: Recent Labs  Lab 02/12/18 0622 02/13/18 0518 02/14/18 0525 02/15/18 0616 02/16/18 0431 02/17/18 0603 02/18/18 0536  NA 136 139 141 141 139 139 139  K 4.4 4.0 3.8 3.6 3.6 4.1 3.9  CL 110 113* 114* 113* 111 111 114*  CO2 20* 23 20* 23 21* 24 20*  GLUCOSE 77 88 87 91 131* 120* 123*  BUN 27* _0 26*  CREATININE 2.18* 2.05* 1.71* 1.69* 1.57* 1.88* 1.89*  CALCIUM 8.0* 8.4* 8.0* 8.3* 8.4* 8.2* 8.3*  PHOS 3.9 4.1  --   --  4.5 4.5  --    GFR: Estimated Creatinine Clearance: 23.4 mL/min (A) (by C-G formula based on SCr of 1.89 mg/dL (H)). Liver Function Tests: Recent Labs  Lab 02/12/18 0622 02/13/18 0518 02/16/18 0431 02/17/18 0603 02/18/18 0536  AST  --   --  57*  --  25  ALT  --   --  48*  --  23  ALKPHOS  --   --  78  --  68  BILITOT  --   --  0.4  --  0.4  PROT  --   --  7.7  --  6.9  ALBUMIN 1.4* 1.4* 1.8*  1.8* 1.8* 1.7*   Recent Labs  Lab 02/16/18 0431  LIPASE 27   No results for input(s): AMMONIA in the last 168 hours. Coagulation Profile: Recent Labs  Lab 02/15/18 1437  INR 1.02   Cardiac Enzymes: No results for input(s): CKTOTAL, CKMB, CKMBINDEX, TROPONINI in the last 168 hours. BNP (last 3 results) No results for input(s): PROBNP in the last 8760 hours. HbA1C: No results for input(s): HGBA1C in the last 72 hours. CBG: Recent Labs  Lab 02/15/18 1647 02/18/18 2051  GLUCAP 88 112*   Lipid Profile: Recent Labs    02/18/18 1607  TRIG 148   Thyroid Function Tests: No results for input(s): TSH, T4TOTAL, FREET4, T3FREE, THYROIDAB in the last 72 hours. Anemia Panel: No results for input(s): VITAMINB12, FOLATE, FERRITIN,  TIBC, IRON, RETICCTPCT in the last 72 hours. Sepsis Labs: Recent Labs  Lab 02/18/18 1608  PROCALCITON 0.22    Recent Results (from the past 240 hour(s))  MRSA PCR Screening     Status: None   Collection Time: 02/18/18  6:20 PM  Result Value Ref Range Status   MRSA by PCR NEGATIVE NEGATIVE Final    Comment:        The GeneXpert MRSA Assay (FDA approved for NASAL specimens only), is one component of a comprehensive MRSA colonization surveillance program. It is not intended to diagnose MRSA infection nor to guide or monitor treatment for MRSA infections. Performed at [facility], [place] [address], [place], [place] [address]  Radiology Studies: Ct Abdomen Pelvis Wo Contrast  Addendum Date: 02/17/2018   ADDENDUM REPORT: 02/17/2018 03:01 ADDENDUM: Findings discussed with Arby Barrette, NP. Electronically Signed   By: Dorise Bullion III M.D   On: 02/17/2018 03:01   Result Date: 02/17/2018 CLINICAL DATA:  Upper abdominal pain for 24 hours. Nausea and vomiting. EXAM: CT ABDOMEN AND PELVIS WITHOUT CONTRAST TECHNIQUE: Multidetector CT imaging of the abdomen and pelvis was performed following the standard protocol without IV contrast. COMPARISON:  None. FINDINGS: Lower chest: There is a new left effusion. There is opacity associated with the effusion which could represent atelectasis or pneumonia. Fibrotic changes in the bases. No other abnormalities in the lower chest. Hepatobiliary: The liver is normal. The gallbladder is distended but this is a stable finding. Pancreas: Unremarkable. No pancreatic ductal dilatation or surrounding inflammatory changes. Spleen: The splenic infarcts described on the comparison study are much less conspicuous on this unenhanced study but do persist. Adrenals/Urinary Tract: Adrenal glands are normal. No hydronephrosis or perinephric stranding. A cyst is seen in the left kidney. No ureterectasis or ureteral stones. The bladder is normal.  Stomach/Bowel: The stomach is normal. There is a markedly abnormal loop of proximal jejunum as seen on series 3, image 30 which appears edematous and thick walled. This abnormal loop extends into the left lower quadrant before returning to normal caliber. The remainder of the small bowel is normal. The colon is unremarkable. The appendix is normal in appearance. Vascular/Lymphatic: Atherosclerotic changes in the nonaneurysmal aorta. No change in lymph nodes. Reproductive: Uterus and bilateral adnexa are unremarkable. Other: New ascites in the abdomen.  No free air. Musculoskeletal: No acute or significant osseous findings. IMPRESSION: 1. Markedly abnormal loop of proximal jejunum extending into the mid jejunum with an edematous appearance. The wall is markedly thickened. Findings are consistent with indeterminate enteritis which could be ischemic, infectious, or inflammatory. Given the possibility of embolic disease on the recent comparison, ischemic enteritis should be considered. 2. New left effusion. 3. The opacity underlying the new left effusion could represent atelectasis or pneumonia. 4. Gallbladder is distended but stable. Ultrasound could better evaluate if warranted. 5. Atherosclerosis in the nonaneurysmal aorta. 6. Suspected splenic infarcts again noted. Findings being called to the referring clinical team. Electronically Signed: By: Dorise Bullion III M.D On: 02/17/2018 01:39   Ct Soft Tissue Neck Wo Contrast  Result Date: 02/18/2018 CLINICAL DATA:  Angioedema. EXAM: CT NECK WITHOUT CONTRAST TECHNIQUE: Multidetector CT imaging of the neck was performed following the standard protocol without intravenous contrast. COMPARISON:  None. FINDINGS: PHARYNX AND LARYNX: Patient is intubated. The tip of the endotracheal tube is at the carina. There is diffuse edema of the pharynx and larynx with effacement of all laryngopharyngeal spaces. SALIVARY GLANDS: Asymmetric enlargement of the left parotid and  submandibular glands with mild surrounding edema and thickening of the left platysma muscle. THYROID: Normal. LYMPH NODES: Numerous bilateral subcentimeter lymph nodes. No enlarged or abnormal density nodes. VASCULAR: Limited assessment without IV contrast. LIMITED INTRACRANIAL: Normal. VISUALIZED ORBITS: Normal. MASTOIDS AND VISUALIZED PARANASAL SINUSES: No fluid levels or advanced mucosal thickening. No mastoid effusion. SKELETON: No bony spinal canal stenosis. No lytic or blastic lesions. UPPER CHEST: Incompletely visualized left lower lobe consolidation. OTHER: None. IMPRESSION: 1. Diffuse edema of the pharynx and larynx, consistent with angioedema. 2. Enlarged left parotid and submandibular glands with surrounding edema and thickening of the platysma muscle. This is of uncertain etiology, but may be part of the same process causing the diffuse airway  edema. 3. Endotracheal tube tip is just above the carina. Retraction 4 cm would place it at the level of the clavicular heads. 4. Incompletely visualized dependent consolidation within the superior portion of left lower lobe. Electronically Signed   By: Ulyses Jarred M.D.   On: 02/18/2018 18:21   Dg Chest Port 1 View  Result Date: 02/18/2018 CLINICAL DATA:  67 year old female status post intubation and gastric tube placement EXAM: PORTABLE CHEST 1 VIEW COMPARISON:  Prior chest x-ray 02/18/2018 FINDINGS: The endotracheal tube tip is 2.8 cm above the carina. A nasogastric tube is present in the tip overlies the gastric fundus. Bibasilar airspace opacities, similar compared to the prior imaging. Subpleural reticulation and honeycombing was present on the prior CT scan. Suspect layering left pleural effusion. Chronic background bronchitic changes. No evidence of pulmonary edema or pneumothorax. No acute osseous abnormality. IMPRESSION: 1. The tip of the endotracheal tube is well positioned nearly 3 cm above the carina. 2. The nasogastric tube projects over the  gastric fundus. 3. Persistent low inspiratory volumes with bibasilar atelectasis worse on the left than the right. 4. Small layering left pleural effusion. 5. Lower lobe fibrotic changes. Electronically Signed   By: Jacqulynn Cadet M.D.   On: 02/18/2018 16:00        Scheduled Meds: . chlorhexidine gluconate (MEDLINE KIT)  15 mL Mouth Rinse BID  . dexamethasone  4 mg Intravenous Q6H  . diphenhydrAMINE  25 mg Intravenous Q8H  . EPINEPHrine      . fentaNYL (SUBLIMAZE) injection  50 mcg Intravenous Once  . mouth rinse  15 mL Mouth Rinse 10 times per day  . sodium chloride flush  3 mL Intravenous Q12H   Continuous Infusions: . sodium chloride    . dextrose 5 % and 0.9% NaCl Stopped (02/18/18 2043)  . famotidine (PEPCID) IV    . fentaNYL infusion INTRAVENOUS 75 mcg/hr (02/18/18 2100)  . heparin 1,000 Units/hr (02/18/18 2100)  . propofol (DIPRIVAN) infusion 40 mcg/kg/min (02/18/18 2100)     LOS: 9 days   CRITICAL CARE Performed by: Cordelia Poche   Total critical care time: 45 minutes  Critical care time was exclusive of separately billable procedures and treating other patients.  Critical care was necessary to treat or prevent imminent or life-threatening deterioration.  Critical care was time spent personally by me on the following activities: development of treatment plan with patient and/or surrogate as well as nursing, discussions with consultants, evaluation of patient's response to treatment, examination of patient, obtaining history from patient or surrogate, ordering and performing treatments and interventions, ordering and review of laboratory studies, ordering and review of radiographic studies, pulse oximetry and re-evaluation of patient's condition.  Cordelia Poche, MD [facility] 02/18/2018, 10:24 PM  If 7PM-7AM, please contact night-coverage [email or web address]

## 2018-02-18 NOTE — Progress Notes (Signed)
PCCM:  Patient seen on the floor. Acute swelling of the tongue and throat concerning for angioedema. Now unable to tolerate saliva. Urgent transport to the ICU for intubation. Discussed with son at bedside.   Josephine IgoBradley L Averleigh Savary, DO [facility] 02/18/2018 4:09 PM  Personal pager: [phone] If unanswered, please page CCM On-call: [phone]

## 2018-02-18 NOTE — Procedures (Signed)
Intubation Procedure Note Madeline Harper [id number] November 21, 1950  Procedure: Intubation Indications: Airway protection and maintenance Acute angioedema, tongue swelling, airway compromise.   Procedure Details Consent: Risks of procedure as well as the alternatives and risks of each were explained to the (patient/caregiver).  Consent for procedure obtained. Time Out: Verified patient identification, verified procedure, site/side was marked, verified correct patient position, special equipment/implants available, medications/allergies/relevent history reviewed, required imaging and test results available.  Performed  Maximum sterile technique was used including gloves, gown, hand hygiene and mask.  MAC 3, glidescope.   Sedated with fentanyl, versed, etomidate and succinylcholine 75mg.  There was significant tongue swelling and swelling of the vocal folds and arytenoids. There was a grade 4 view of the vocal cords, grade 3 view with cricoid pressure A 6.0ETT was passed behide the epiglottis and lifted out of the away. The ETT was successfully placed with good CO2 oximetry color change.   Evaluation Hemodynamic Status: BP stable throughout; O2 sats: stable throughout Patient's Current Condition: stable Complications: No apparent complications Patient did tolerate procedure well. Chest X-ray ordered to verify placement.  CXR: pending.  BGarner Nash DO [facility] 02/18/2018 4:15 PM  Personal pager: #[phone] unanswered, please page CCM On-call: #[phone]

## 2018-02-19 DIAGNOSIS — J84112 Idiopathic pulmonary fibrosis: Secondary | ICD-10-CM

## 2018-02-19 DIAGNOSIS — J849 Interstitial pulmonary disease, unspecified: Secondary | ICD-10-CM

## 2018-02-19 DIAGNOSIS — T783XXD Angioneurotic edema, subsequent encounter: Secondary | ICD-10-CM

## 2018-02-19 LAB — POCT I-STAT 3, ART BLOOD GAS (G3+)
Acid-base deficit: 9 mmol/L — ABNORMAL HIGH (ref 0.0–2.0)
BICARBONATE: 17 mmol/L — AB (ref 20.0–28.0)
O2 Saturation: 98 %
PO2 ART: 104 mmHg (ref 83.0–108.0)
Patient temperature: 97.7
TCO2: 18 mmol/L — AB (ref 22–32)
pCO2 arterial: 34.6 mmHg (ref 32.0–48.0)
pH, Arterial: 7.298 — ABNORMAL LOW (ref 7.350–7.450)

## 2018-02-19 LAB — MPO/PR-3 (ANCA) ANTIBODIES
ANCA Proteinase 3: <3.5 U/mL (ref 0.0–3.5)
Myeloperoxidase Abs: <9 U/mL (ref 0.0–9.0)

## 2018-02-19 LAB — MAGNESIUM: Magnesium: 2 mg/dL (ref 1.7–2.4)

## 2018-02-19 LAB — CBC
HCT: 24.6 % — ABNORMAL LOW (ref 36.0–46.0)
Hemoglobin: 7.1 g/dL — ABNORMAL LOW (ref 12.0–15.0)
MCH: 27 pg (ref 26.0–34.0)
MCHC: 28.9 g/dL — ABNORMAL LOW (ref 30.0–36.0)
MCV: 93.5 fL (ref 80.0–100.0)
Platelets: 166 K/uL (ref 150–400)
RBC: 2.63 MIL/uL — ABNORMAL LOW (ref 3.87–5.11)
RDW: 16.4 % — ABNORMAL HIGH (ref 11.5–15.5)
WBC: 10.6 K/uL — ABNORMAL HIGH (ref 4.0–10.5)
nRBC: 0.2 % (ref 0.0–0.2)

## 2018-02-19 LAB — RENAL FUNCTION PANEL
Albumin: 1.3 g/dL — ABNORMAL LOW (ref 3.5–5.0)
Anion gap: 8 (ref 5–15)
BUN: 42 mg/dL — AB (ref 8–23)
CO2: 16 mmol/L — AB (ref 22–32)
CREATININE: 2.73 mg/dL — AB (ref 0.44–1.00)
Calcium: 7.7 mg/dL — ABNORMAL LOW (ref 8.9–10.3)
Chloride: 116 mmol/L — ABNORMAL HIGH (ref 98–111)
GFR, EST AFRICAN AMERICAN: 20 mL/min — AB (ref >60)
GFR, EST NON AFRICAN AMERICAN: 17 mL/min — AB (ref >60)
Glucose, Bld: 169 mg/dL — ABNORMAL HIGH (ref 70–99)
Phosphorus: 7 mg/dL — ABNORMAL HIGH (ref 2.5–4.6)
Potassium: 4.6 mmol/L (ref 3.5–5.1)
Sodium: 140 mmol/L (ref 135–145)

## 2018-02-19 LAB — C4 COMPLEMENT: COMPLEMENT C4, BODY FLUID: 5 mg/dL — AB (ref 14–44)

## 2018-02-19 LAB — FANA STAINING PATTERNS: Speckled Pattern: 1:1280 {titer}

## 2018-02-19 LAB — GLUCOSE, CAPILLARY
GLUCOSE-CAPILLARY: 100 mg/dL — AB (ref 70–99)
GLUCOSE-CAPILLARY: 95 mg/dL (ref 70–99)
GLUCOSE-CAPILLARY: 103 mg/dL — AB (ref 70–99)
Glucose-Capillary: 112 mg/dL — ABNORMAL HIGH (ref 70–99)
Glucose-Capillary: 78 mg/dL (ref 70–99)
Glucose-Capillary: 99 mg/dL (ref 70–99)

## 2018-02-19 LAB — HEPARIN LEVEL (UNFRACTIONATED)
HEPARIN UNFRACTIONATED: 0.36 [IU]/mL (ref 0.30–0.70)
Heparin Unfractionated: 0.74 [IU]/mL — ABNORMAL HIGH (ref 0.30–0.70)
Heparin Unfractionated: 0.52 IU/mL (ref 0.30–0.70)

## 2018-02-19 LAB — C3 COMPLEMENT: C3 COMPLEMENT: 52 mg/dL — AB (ref 82–167)

## 2018-02-19 LAB — ANTI-DNA ANTIBODY, DOUBLE-STRANDED: ds DNA Ab: 14 IU/mL — ABNORMAL HIGH (ref 0–9)

## 2018-02-19 LAB — ANTINUCLEAR ANTIBODIES, IFA: ANTINUCLEAR ANTIBODIES, IFA: POSITIVE — AB

## 2018-02-19 LAB — PROCALCITONIN: Procalcitonin: 0.35 ng/mL

## 2018-02-19 MED ORDER — SODIUM BICARBONATE 8.4 % IV SOLN
INTRAVENOUS | Status: DC
  Administered 2018-02-19 – 2018-02-20 (×2): via INTRAVENOUS
  Filled 2018-02-19 (×5): qty 150

## 2018-02-19 MED ORDER — FAMOTIDINE IN NACL 20-0.9 MG/50ML-% IV SOLN
20.0000 mg | INTRAVENOUS | Status: DC
  Administered 2018-02-20 – 2018-02-24 (×5): 20 mg via INTRAVENOUS
  Filled 2018-02-19 (×5): qty 50

## 2018-02-19 MED ORDER — DEXAMETHASONE SODIUM PHOSPHATE 4 MG/ML IJ SOLN
4.0000 mg | Freq: Four times a day (QID) | INTRAMUSCULAR | Status: DC
  Administered 2018-02-19 – 2018-02-20 (×5): 4 mg via INTRAVENOUS
  Filled 2018-02-19 (×5): qty 1

## 2018-02-19 MED ORDER — SODIUM CHLORIDE 0.9 % IV SOLN
INTRAVENOUS | Status: DC
  Administered 2018-02-19: via INTRAVENOUS

## 2018-02-19 NOTE — Progress Notes (Signed)
Initial Nutrition Assessment  DOCUMENTATION CODES:   Not applicable  INTERVENTION:   If pt remains on vent support, recommend initiation of enteral nutrition within 24-48 hours. If enteral nutrition not feasible due to possible gut ischemia, recommend initiation of TPN   NUTRITION DIAGNOSIS:   Inadequate oral intake related to acute illness as evidenced by NPO status.  GOAL:   Patient will meet greater than or equal to 90% of their needs   MONITOR:   Vent status, Labs, Weight trends  REASON FOR ASSESSMENT:   Ventilator    ASSESSMENT:    67 yo female admitted on 11/07 with left abdominal pain and found to have splenic infart with unclear etiology, further complicated by AKI. Pt additionally found to have ischemic bowel. Pt developed angioedema on 11/13 requiring intubation. PMH includes mixed connective tissue disorder, UIP, anemia   11/07 Admitted 11/11 CT abdomen concerning for possible ischemic enteritis of jejunum; GI consulted 11/13 Developed angioedema, transferred to ICU and intubated for airway protection  Patient is currently intubated on ventilator support MV: 6.8 L/min Temp (24hrs), Avg:97.6 F (36.4 C), Min:97.5 F (36.4 C), Max:97.7 F (36.5 C)  Propofol: 3.5 ml/hr  Family at bedside and indicates pt has not been eating well this admission. In fact, pt has been vomiting and unable to keep down any food for several days prior to intubation. Family reports pt has lost a few pounds but no significant wt loss  Current wt 58.2 kg (128 pounds) which was taken 11/12, no other weight encounter for this admission except for 59.9 kg (11/03)  Labs: Creatinine 2.73, BUN 42, phosphorus 7.0 (H), corrected calcium 9.9, albumin 1.3 Meds: decadron, fentanyl, diprivan  NUTRITION - FOCUSED PHYSICAL EXAM:    Most Recent Value  Orbital Region  No depletion  Upper Arm Region  No depletion  Thoracic and Lumbar Region  No depletion  Buccal Region  No depletion  Temple  Region  Mild depletion  Clavicle Bone Region  No depletion  Clavicle and Acromion Bone Region  No depletion  Scapular Bone Region  No depletion  Dorsal Hand  Unable to assess  Patellar Region  Mild depletion  Anterior Thigh Region  Mild depletion  Posterior Calf Region  Mild depletion  Edema (RD Assessment)  None       Diet Order:   Diet Order            Diet NPO time specified  Diet effective now              EDUCATION NEEDS:   Not appropriate for education at this time  Skin:  Skin Assessment: Reviewed RN Assessment  Last BM:  11/10  Height:   Ht Readings from Last 1 Encounters:  02/18/18 5' (1.524 m)    Weight:   Wt Readings from Last 1 Encounters:  02/17/18 58.2 kg    Ideal Body Weight:     BMI:  Body mass index is 25.06 kg/m.  Estimated Nutritional Needs:   Kcal:  1100 kcals   Protein:  80-93 g   Fluid:  >/= 1.5 L   Romelle Starcherate Sravya Grissom MS, RD, LDN, CNSC [phone] Pager  [phone] Weekend/On-Call Pager

## 2018-02-19 NOTE — Progress Notes (Signed)
ELink notified that pt had 0 urine output for the shift. Bladder scan showed 180 mls. New orders for 1 liter fluid bolus. RN will continue to monitor pt.

## 2018-02-19 NOTE — Progress Notes (Addendum)
ANTICOAGULATION CONSULT NOTE - Follow Up Consult  Pharmacy Consult for Heparin Indication: Splenic infarct  Patient Measurements: Height: 5' (152.4 cm) Weight: 128 lb 4.9 oz (58.2 kg) IBW/kg (Calculated) : 45.5 Heparin Dosing Weight: 55.8 kg  Vital Signs: Temp: 97.1 F (36.2 C) (11/14 1635) Temp Source: Axillary (11/14 1635) BP: 116/61 (11/14 2000) Pulse Rate: 59 (11/14 1534)  Labs: Recent Labs    02/17/18 0603  02/18/18 0536 02/19/18 0259 02/19/18 1042 02/19/18 1857  HGB 9.3*  --  8.6* 7.1*  --   --   HCT 30.8*  --  28.8* 24.6*  --   --   PLT 230  --  198 166  --   --   HEPARINUNFRC  --    < > 0.55 0.74* 0.52 0.36  CREATININE 1.88*  --  1.89* 2.73*  --   --    < > = values in this interval not displayed.    Estimated Creatinine Clearance: 16.2 mL/min (A) (by C-G formula based on SCr of 2.73 mg/dL (H)).   Medications:  Infusions:  . sodium chloride Stopped (02/18/18 2315)  . [START ON 02/20/2018] famotidine (PEPCID) IV    . fentaNYL infusion INTRAVENOUS 75 mcg/hr (02/19/18 2001)  . heparin 900 Units/hr (02/19/18 1953)  . propofol (DIPRIVAN) infusion 10 mcg/kg/min (02/19/18 1500)  .  sodium bicarbonate  infusion 1000 mL 50 mL/hr at 02/19/18 1715    Assessment: [age] YOF who presented on 11/3 with L-flank pain and was found to have a splenic infarct and now with imaging showing ischemic colitis - currently evaluating for source. Pharmacy consulted to dose Heparin for anticoagulation.  Emergently intubated 11/13 due to angioedema and heparin infusion was stopped upon transfer to 2H. CT neck showing angioedema but no indications of hematoma. Heparin infusion okay to resume per CCM.  Heparin level is therapeutic at 0.36, on 900 units/hr. Hgb low on 7.1, plt 166. No s/sx of bleeding. No infusion issues.   Goal of Therapy:  Heparin level 0.3-0.7 units/ml Monitor platelets by anticoagulation protocol: Yes   Plan:  Continue heparin infusion at 900 units/hr Daily heparin  level and CBC while on heparin Monitor s/sx bleeding, plans to transition to PO anticoagulation  Thank you for allowing pharmacy to be a part of this patient's care.  Sherron MondayKimberly Marcus Groll, PharmD Clinical Pharmacist  Pager: [phone] Phone: [phone]  02/19/2018   8:07 PM

## 2018-02-19 NOTE — Plan of Care (Signed)
  Problem: Clinical Measurements: Goal: Respiratory complications will improve Outcome: Progressing Goal: Cardiovascular complication will be avoided Outcome: Progressing   Problem: Health Behavior/Discharge Planning: Goal: Ability to manage health-related needs will improve Outcome: Not Progressing   Problem: Activity: Goal: Risk for activity intolerance will decrease Outcome: Not Progressing Note:  Pt intubated and sedated, Bedfast     Problem: Nutrition: Goal: Adequate nutrition will be maintained Outcome: Not Progressing

## 2018-02-19 NOTE — Progress Notes (Signed)
S: Events of last 24 hours noted.  Pt currently intubated and sedated with marked angioedema still. O:BP (!) 117/56   Pulse (!) 54   Temp 97.7 F (36.5 C) (Oral)   Resp 18   Ht 5' (1.524 m)   Wt 58.2 kg   SpO2 100%   BMI 25.06 kg/m   Intake/Output Summary (Last 24 hours) at 02/19/2018 1041 Last data filed at 02/19/2018 1000 Gross per 24 hour  Intake 3132.82 ml  Output -  Net 3132.82 ml   Intake/Output: I/O last 3 completed shifts: In: 3609.2 [I.V.:3459.2; IV Piggyback:150] Out: -   Intake/Output this shift:  Total I/O In: 245.1 [I.V.:195.1; IV Piggyback:50] Out: -  Weight change:  Gen: intubated/sedated CVS: bradycardic Resp:cta Abd: +BS, soft, NT Ext: no edema  Recent Labs  Lab 02/13/18 0518 02/14/18 0525 02/15/18 0616 02/16/18 0431 02/17/18 0603 02/18/18 0536 02/19/18 0259  NA 139 141 141 139 139 139 140  K 4.0 3.8 3.6 3.6 4.1 3.9 4.6  CL 113* 114* 113* 111 111 114* 116*  CO2 23 20* 23 21* 24 20* 16*  GLUCOSE 88 87 91 131* 120* 123* 169*  BUN _0 26* 42*  CREATININE 2.05* 1.71* 1.69* 1.57* 1.88* 1.89* 2.73*  ALBUMIN 1.4*  --   --  1.8*  1.8* 1.8* 1.7* 1.3*  CALCIUM 8.4* 8.0* 8.3* 8.4* 8.2* 8.3* 7.7*  PHOS 4.1  --   --  4.5 4.5  --  7.0*  AST  --   --   --  57*  --  25  --   ALT  --   --   --  48*  --  23  --    Liver Function Tests: Recent Labs  Lab 02/16/18 0431 02/17/18 0603 02/18/18 0536 02/19/18 0259  AST 57*  --  25  --   ALT 48*  --  23  --   ALKPHOS 78  --  68  --   BILITOT 0.4  --  0.4  --   PROT 7.7  --  6.9  --   ALBUMIN 1.8*  1.8* 1.8* 1.7* 1.3*   Recent Labs  Lab 02/16/18 0431  LIPASE 27   No results for input(s): AMMONIA in the last 168 hours. CBC: Recent Labs  Lab 02/14/18 0525 02/15/18 0616 02/16/18 0431 02/17/18 0603 02/18/18 0536 02/19/18 0259  WBC 3.1* 3.4* 4.2 7.9 10.6* 10.6*  NEUTROABS 2.3 2.4  --  6.5  --   --   HGB 7.9* 8.4* 9.5* 9.3* 8.6* 7.1*  HCT 27.5* 27.9* 32.4* 30.8* 28.8* 24.6*  MCV  90.8 89.4 89.5 90.6 90.0 93.5  PLT 224 201 261 230 198 166   Cardiac Enzymes: No results for input(s): CKTOTAL, CKMB, CKMBINDEX, TROPONINI in the last 168 hours. CBG: Recent Labs  Lab 02/15/18 1647 02/18/18 2051 02/18/18 2337 02/19/18 0324 02/19/18 0723  GLUCAP 88 112* 116* 112* 100*    Iron Studies: No results for input(s): IRON, TIBC, TRANSFERRIN, FERRITIN in the last 72 hours. Studies/Results: Ct Soft Tissue Neck Wo Contrast  Result Date: 02/18/2018 CLINICAL DATA:  Angioedema. EXAM: CT NECK WITHOUT CONTRAST TECHNIQUE: Multidetector CT imaging of the neck was performed following the standard protocol without intravenous contrast. COMPARISON:  None. FINDINGS: PHARYNX AND LARYNX: Patient is intubated. The tip of the endotracheal tube is at the carina. There is diffuse edema of the pharynx and larynx with effacement of all laryngopharyngeal spaces. SALIVARY GLANDS: Asymmetric enlargement of the left parotid and  submandibular glands with mild surrounding edema and thickening of the left platysma muscle. THYROID: Normal. LYMPH NODES: Numerous bilateral subcentimeter lymph nodes. No enlarged or abnormal density nodes. VASCULAR: Limited assessment without IV contrast. LIMITED INTRACRANIAL: Normal. VISUALIZED ORBITS: Normal. MASTOIDS AND VISUALIZED PARANASAL SINUSES: No fluid levels or advanced mucosal thickening. No mastoid effusion. SKELETON: No bony spinal canal stenosis. No lytic or blastic lesions. UPPER CHEST: Incompletely visualized left lower lobe consolidation. OTHER: None. IMPRESSION: 1. Diffuse edema of the pharynx and larynx, consistent with angioedema. 2. Enlarged left parotid and submandibular glands with surrounding edema and thickening of the platysma muscle. This is of uncertain etiology, but may be part of the same process causing the diffuse airway edema. 3. Endotracheal tube tip is just above the carina. Retraction 4 cm would place it at the level of the clavicular heads. 4.  Incompletely visualized dependent consolidation within the superior portion of left lower lobe. Electronically Signed   By: Ulyses Jarred M.D.   On: 02/18/2018 18:21   Dg Chest Port 1 View  Result Date: 02/18/2018 CLINICAL DATA:  67 year old female status post intubation and gastric tube placement EXAM: PORTABLE CHEST 1 VIEW COMPARISON:  Prior chest x-ray 03/06/2018 FINDINGS: The endotracheal tube tip is 2.8 cm above the carina. A nasogastric tube is present in the tip overlies the gastric fundus. Bibasilar airspace opacities, similar compared to the prior imaging. Subpleural reticulation and honeycombing was present on the prior CT scan. Suspect layering left pleural effusion. Chronic background bronchitic changes. No evidence of pulmonary edema or pneumothorax. No acute osseous abnormality. IMPRESSION: 1. The tip of the endotracheal tube is well positioned nearly 3 cm above the carina. 2. The nasogastric tube projects over the gastric fundus. 3. Persistent low inspiratory volumes with bibasilar atelectasis worse on the left than the right. 4. Small layering left pleural effusion. 5. Lower lobe fibrotic changes. Electronically Signed   By: Jacqulynn Cadet M.D.   On: 02/18/2018 16:00   . chlorhexidine gluconate (MEDLINE KIT)  15 mL Mouth Rinse BID  . dexamethasone  4 mg Intravenous Q6H  . diphenhydrAMINE  25 mg Intravenous Q8H  . fentaNYL (SUBLIMAZE) injection  50 mcg Intravenous Once  . insulin aspart  0-9 Units Subcutaneous Q4H  . mouth rinse  15 mL Mouth Rinse 10 times per day  . sodium chloride flush  3 mL Intravenous Q12H    BMET    Component Value Date/Time   NA 140 02/19/2018 0259   NA 138 12/20/2013   K 4.6 02/19/2018 0259   CL 116 (H) 02/19/2018 0259   CO2 16 (L) 02/19/2018 0259   GLUCOSE 169 (H) 02/19/2018 0259   BUN 42 (H) 02/19/2018 0259   BUN 20 12/20/2013   CREATININE 2.73 (H) 02/19/2018 0259   CREATININE 0.90 11/18/2016 1350   CALCIUM 7.7 (L) 02/19/2018 0259   GFRNONAA  17 (L) 02/19/2018 0259   GFRNONAA 67 11/18/2016 1350   GFRAA 20 (L) 02/19/2018 0259   GFRAA 78 11/18/2016 1350   CBC    Component Value Date/Time   WBC 10.6 (H) 02/19/2018 0259   RBC 2.63 (L) 02/19/2018 0259   HGB 7.1 (L) 02/19/2018 0259   HCT 24.6 (L) 02/19/2018 0259   PLT 166 02/19/2018 0259   MCV 93.5 02/19/2018 0259   MCH 27.0 02/19/2018 0259   MCHC 28.9 (L) 02/19/2018 0259   RDW 16.4 (H) 02/19/2018 0259   LYMPHSABS 0.6 (L) 02/17/2018 0603   MONOABS 0.7 02/17/2018 0603   EOSABS 0.0 02/17/2018  0603   BASOSABS 0.0 02/17/2018 0603     Assessment/Plan:  1. VDRF due to acute onset angioedema- unclear what the offending agent is.  She did report trying viscous lidocaine with "gagging and throat closing" yesterday and unclear if she drank more afterwards.  Appreciate PCCM assistance with her care.  2. AKI/CKD in setting of splenic infarct, IV contrast and volume depletion with concomitant ACE-inhibition and now with VDRF and angioedema. Cr was slowly improving, however given N/V and acute angioedema, Cr has worsened.  Unable to perform renal biopsy due to need for anticoagulation and the development of ischemic bowel off of heparin.  Continue to hold lisinopril and follow renal function.  1. GN workup underway with serologies 2. She did not want to change imuran to cellcept prior to her angioedema but I did discuss this with Dr. Trudie Reed who was in favor of the change (the Imuran was for her pulmonary fibrosis).  3. Renal biopsy still on hold due to her ischemic bowel off of heparin gtt and now with VDRF.   4. 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  Will recheck complement levels.  Cont to follow for now.  3. Abdominal pain with N/V- w/uwith CT revealed possible ischemic enteritis of jejunum and GI has been consulted for further evaluation and management. She is back on heparin and pain is  intermittent but somewhat improved. 4. Splenic infarction, acute- is on IV heparin but was put on hold for biopsy, cont to hold for now.Hypercoagulable workup negative thus far. 5. Proteinuria- as above, hold off on bx for now. 6. HTN- will start bp meds and follow (lisinopril on hold due to AKI/CKD). Will add amlodipine and may need clonidine prn.  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 Idiopathic Pulmonary Fibrosis- on imuran  Donetta Potts, MD [facility] [phone]

## 2018-02-19 NOTE — Progress Notes (Signed)
Madeline Harper 4:11 PM  Subjective: Patient seen and examined and case discussed with her son and the nurse and she is still intubated and sedated and has not had a bowel movement but no obvious new problems  Objective: Vital signs stable afebrile no acute distress abdomen is soft occasional bowel sounds no obvious tenderness White count unchanged some hemoglobin dropworse BUN and creatinine Assessment: Multiple medical problems  Plan: Please call me if I could be of any assistance tomorrow otherwise will ask weekend Dr. To look in on her but call us sooner when necessary  [facility] - Hot SpringsMAGOD,Temia Debroux E  Pager [phone] After 5PM or if no answer call [phone]

## 2018-02-19 NOTE — Progress Notes (Signed)
Pt's wedding band removed from left hand and sent home with family (daughter) along with pt's 1 earring.

## 2018-02-19 NOTE — Progress Notes (Addendum)
ANTICOAGULATION CONSULT NOTE - Follow Up Consult  Pharmacy Consult for Heparin Indication: Splenic infarct  Patient Measurements: Height: 5' (152.4 cm) Weight: 128 lb 4.9 oz (58.2 kg) IBW/kg (Calculated) : 45.5 Heparin Dosing Weight: 55.8 kg  Vital Signs: Temp: 97.7 F (36.5 C) (11/14 1107) Temp Source: Oral (11/14 1107) BP: 120/61 (11/14 1100) Pulse Rate: 59 (11/14 1107)  Labs: Recent Labs    02/17/18 0603  02/18/18 0536 02/19/18 0259 02/19/18 1042  HGB 9.3*  --  8.6* 7.1*  --   HCT 30.8*  --  28.8* 24.6*  --   PLT 230  --  198 166  --   HEPARINUNFRC  --    < > 0.55 0.74* 0.52  CREATININE 1.88*  --  1.89* 2.73*  --    < > = values in this interval not displayed.    Estimated Creatinine Clearance: 16.2 mL/min (A) (by C-G formula based on SCr of 2.73 mg/dL (H)).   Medications:  Infusions:  . sodium chloride Stopped (02/18/18 2315)  . dextrose 5 % and 0.9% NaCl 50 mL/hr at 02/19/18 1000  . famotidine (PEPCID) IV Stopped (02/19/18 [id number])  . fentaNYL infusion INTRAVENOUS 50 mcg/hr (02/19/18 1000)  . heparin 900 Units/hr (02/19/18 1000)  . propofol (DIPRIVAN) infusion 10 mcg/kg/min (02/19/18 1000)    Assessment: [age] YOF who presented on 11/3 with L-flank pain and was found to have a splenic infarct and now with imaging showing ischemic colitis - currently evaluating for source. Pharmacy consulted to dose Heparin for anticoagulation.  Emergently intubated 11/13 due to angioedema and heparin infusion was stopped upon transfer to 2H. CT neck showing angioedema but no indications of hematoma. Heparin infusion okay to resume per CCM.  Heparin level 0.52 units/ml status post dose decrease overnight. Hgb trending down to 7.1, platelets down but within normal limits, and no overt bleeding noted. No issues with heparin infusion per RN.  Goal of Therapy:  Heparin level 0.3-0.7 units/ml Monitor platelets by anticoagulation protocol: Yes   Plan:  Continue heparin infusion at 900  units/hr Check confirmatory heparin level in 8 hours Daily heparin level and CBC while on heparin Monitor s/sx bleeding, plans to transition to PO anticoagulation  Thank you for allowing pharmacy to be a part of this patient's care.  Harlow MaresAmy Amanada Philbrick, PharmD PGY1 Pharmacy Resident Phone [phone]  02/19/2018   11:54 AM

## 2018-02-19 NOTE — Progress Notes (Signed)
ANTICOAGULATION CONSULT NOTE - Follow Up Consult  Pharmacy Consult for Heparin Indication: Splenic infarct  Patient Measurements: Height: 5' (152.4 cm) Weight: 128 lb 4.9 oz (58.2 kg) IBW/kg (Calculated) : 45.5 Heparin Dosing Weight: 55.8 kg  Vital Signs: Temp: 97.7 F (36.5 C) (11/14 0348) Temp Source: Oral (11/14 0348) BP: 100/57 (11/14 0330) Pulse Rate: 53 (11/14 0330)  Labs: Recent Labs    02/16/18 0431 02/17/18 0603 02/17/18 2138 02/18/18 0536 02/19/18 0259  HGB 9.5* 9.3*  --  8.6* 7.1*  HCT 32.4* 30.8*  --  28.8* 24.6*  PLT 261 230  --  198 166  APTT 69*  --   --   --   --   HEPARINUNFRC 0.80*  --  0.58 0.55 0.74*  CREATININE 1.57* 1.88*  --  1.89*  --     Estimated Creatinine Clearance: 23.4 mL/min (A) (by C-G formula based on SCr of 1.89 mg/dL (H)).   Medications:  Infusions:  . sodium chloride Stopped (02/18/18 2315)  . dextrose 5 % and 0.9% NaCl 50 mL/hr at 02/19/18 0345  . famotidine (PEPCID) IV Stopped (02/18/18 2258)  . fentaNYL infusion INTRAVENOUS 100 mcg/hr (02/19/18 0345)  . heparin 1,000 Units/hr (02/19/18 0345)  . propofol (DIPRIVAN) infusion 25 mcg/kg/min (02/19/18 0357)    Assessment: [age] YOF who presented on 11/3 with L-flank pain and was found to have a splenic infarct and now with imaging showing ischemic colitis - currently evaluating for source. Pharmacy consulted to dose Heparin for anticoagulation.   Transferred to 2H due to angioedema, now intubated. Heparin infusion was stopped upon arrival to unit. CT neck showing diffuse edema of pharynx and larynx consistent with angioedema and no indications of hematoma. Plan to resume heparin infusion at previously therapeutic rate.  Heparin level this am 0.74 units/ml.  Hg down to 7.1, no overt bleeding noted   Goal of Therapy:  Heparin level 0.3-0.7 units/ml Monitor platelets by anticoagulation protocol: Yes   Plan:  Decreaset heparin infusion to 900 units/hr Check heparin level later  today  Thank you for allowing pharmacy to be a part of this patient's care.  Talbert CageLora Avalon Coppinger, PharmD 02/19/2018 4:07 AM

## 2018-02-19 NOTE — Progress Notes (Signed)
NAME:  Madeline Harper, MRN:  [id number], DOB:  1950-05-07, LOS: 10 ADMISSION DATE:  2018/02/15, CONSULTATION DATE:  02/18/2018 REFERRING MD:  Dr. 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  Additionally found to have ischemic bowel and placed on heparin gtt.  Developed acute angioedema 11/13 requiring emergent intubation.   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  11/14 tongue swelling improved. UOP dropping, acid base worse. Cr increased.   Consults:  11/5 ID  11/5 cardiology 11/8 Nephrology 11/12 GI 11/13 PCCM   Procedures:  11/5 TEE >> LVEF 55-60%, no evidence of IE, could not rule out small PFO 11/13 ETT >>  Significant Diagnostic Tests:  11/3 CT abd/pelvis >> 1. Large subcapsular splenic infarct. Findings may be related to an embolic process possibly originating from the heart such as infective endocarditis. Clinical correlation is recommended. 2. Focal area of wall apparent thickening or thrombus in the midportion of the splenic artery with associated luminal narrowing.  3. Mildly enlarged retroperitoneal and right common iliac lymph nodes. 4. Sigmoid diverticulosis. No bowel obstruction or active inflammation. Normal appendix. 5. Possible small left ovarian dermoid.  11/5 TEE >> LVEF 55-60%, no evidence of IE, could not rule out small PFO  11/11 CT abd/pelvis >> 1. Markedly abnormal loop of proximal jejunum extending into the mid jejunum with an edematous appearance. The wall is markedly thickened. Findings c/w indeterminate enteritis which could be ischemic, infectious, or inflammatory.  2. New left effusion. 3. The opacity underlying the new left  effusion could represent atelectasis or pneumonia. 4. Gallbladder is distended but stable.  5. Atherosclerosis in the nonaneurysmal aorta. 6. Suspected splenic infarcts again noted. Ct head/neck: 1. Diffuse edema of the pharynx and larynx, consistent with angioedema.2. Enlarged left parotid and submandibular glands with surrounding edema and thickening of the platysma muscle. This is of uncertain etiology, but may be part of the same process causing the diffuse airway edema. Micro Data:  11/3 BC x 3 >> negative 11/13 MRSA PCR >>  Antimicrobials:  none  Interim history/subjective:  Sedated on vent UOP dropping  No issues overnight   Objective   Blood pressure (Abnormal) 100/53, pulse (Abnormal) 54, temperature 97.7 F (36.5 C), temperature source Oral, resp. rate 18, height 5' (1.524 m), weight 58.2 kg, SpO2 100 %.    Vent Mode: PRVC FiO2 (%):  [30 %-100 %] 30 % Set Rate:  [15 bmp-18 bmp] 18 bmp Vt Set:  [360 mL-410 mL] 360 mL PEEP:  [5 cmH20] 5 cmH20 Plateau Pressure:  [18 cmH20-24 cmH20] 20 cmH20   Intake/Output Summary (Last 24 hours) at 02/19/2018 0929 Last data filed at 02/19/2018 0700 Gross per 24 hour  Intake 2887.72 ml  Output no documentation  Net 2887.72 ml   Filed Weights   Feb 15, 2018 0350 02/17/18 0523  Weight: 59.9 kg 58.2 kg    Examination: General: middle age fm, intubated on mechanical vent  HENT: NCAT, large protruding tongue from oropharynx  Neck: Left>Right neck swelling  Lung: no wheeze, vent bs  Card: RRR, no mrg, s1 s2  Abd: soft, nt, nd  Neuro sedated RASS -1  Resolved Hospital Problem list     Assessment & Plan:  Angioedema / Emergent Airway Obstruction  -acute onset tongue/neck swelling 11/13.  Sore throat began 11/12 (was also started on heparin at that time due to splenic infarct) -etiology unclear  P: Cont decadron q6 Cont pepcid bid Cont benadryl  Acute Respiratory Insufficiency in setting of Airway Obstruction  P: Cont  full vent support PAD protocol PAD protocol RASS -2 to -3 VAP bundle  F/u abg and cxr   Splenic Infarct  -concern for possible SLE / mixed connective tissue process -?small PFO on TTE P: Cont heparin gtt Gi following  Abdominal Pain with Nausea / Vomiting  -CT concerning possible ischemic enteritis of the jejunum P: Cont IV heparin   AKI on CKD -recent IV contrast, volume depletion, ACE-I use -renal fxn worse.  P: Nephrology following Plan for renal bx at some point Renal dose meds Strict I&O Am chemistry   Fluid and electrolyte imbalance: NAGMA, hyperchloremia  -acid base worse  -K rising Plan F/u chemistry Checking abg Consider bicarb replacement   Iron Deficiency Anemia P: Trend cbc Transfuse for hgb < 7  Fever  P: Follow fever curve / WBC trend  Assess PCT   Mixed Connective Tissue Disease / Possible SLE -followed by Dr. Lendon Colonel -on Plaquenil & Imuran at baseline -negative antiphospholipid, lupus anticoagulant, beta-2 glycoprotein, LDH, uric acid, protein C/S, blood cultures x2 negative  P: Holding plaquenil and imuran (11/11)  HTN P: Cont norvac Holding ace-I given angio edema   Best practice:  Diet: NPO Pain/Anxiety/Delirium protocol (if indicated): Propofol, fentanyl gtt's VAP protocol (if indicated): in place  DVT prophylaxis: Heparin gtt  GI prophylaxis: Pepcid  Glucose control: n/a, monitor while on steroids  Mobility: bedrest  Code Status: full code  Family Communication: Son updated at bedside  Disposition: ICU   Simonne Martinet ACNP-BC [facility] Pulmonary/Critical Care Pager # [phone] OR # [phone] if no answer  _____________________________________________________________________________  PCCM Attending Attestation:   S: stable overnight. Labs look a little worse. Renal function and UOP decreasing.   O: BP 120/61   Pulse (!) 59   Temp 97.7 F (36.5 C) (Oral)   Resp 18   Ht 5' (1.524 m)   Wt 58.2 kg   SpO2 100%    BMI 25.06 kg/m   General appearance: 67 y.o., female, intubated sedation  HENT: NCAT; large protruding tongue  Neck: trachea midline, swollen neck  Lungs: no wheeze, vented  CV: RRR, S1, S2 Abdomen: Soft, non-tender; non-distended, BS present  Extremities: No peripheral edema, radial and DP pulses present bilaterally  Skin: Normal temperature, turgor and texture; no rash Neuro: sedated, not following commands    A: Acute angioedema, severe tongue and throat swelling, acute airway compromise requiring intubation and mechanical ventilation.  No evidence of anaphylactic shock  Mixed connective tissue disease, chest imaging with mediastinal and hilar adenopathy as well as pulmonary fibrosis.  CT consistent with fibrotic NSIP  Multiple infarcts, possible ongoing hypercoagulable state, bowel ischemia, splenic infarct  Possible vasculitic in nature versus related to underlying mixed connective tissue disease versus SLE, her antiphospholipid panel lupus anticoagulant cardiolipin antibodies and beta-2 glycoprotein are all negative.  She is followed by a rheumatologist and currently managed with Imuran and Plaquenil.  P: Continue sedation, Goal RASS -4 Need low risk for self extubation Swelling still prominent and at risk for airway compromise Continue antihistamines and decadron Can start to wean antiinflammatories   This patient is critically ill with multiple organ system failure; which, requires frequent high complexity decision making, assessment, support,  evaluation, and titration of therapies. This was completed through the application of advanced monitoring technologies and extensive interpretation of multiple databases. During this encounter critical care time was devoted to patient care services described in this note for 38 minutes.  Josephine IgoBradley L Milagros Middendorf, DO [place] 02/19/2018 11:11 AM  Personal pager: [phone] If unanswered, please page CCM On-call:  #[phone]

## 2018-02-19 NOTE — Progress Notes (Addendum)
PT Cancellation Note  Patient Details Name: Lester KinsmanDiane Glock MRN: [id number] DOB: [birth year]   Cancelled Treatment:    Reason Eval/Treat Not Completed: Patient not medically ready (sedated on vent). Will sign off at this time. Please re-consult when patient is medically ready.  Laurina Bustlearoline Kallee Nam, PT, DPT Acute Rehabilitation Services Pager [phone] Office [phone]    Vanetta MuldersCarloine H Janayia Burggraf 02/19/2018, 9:32 AM

## 2018-02-20 ENCOUNTER — Inpatient Hospital Stay (HOSPITAL_COMMUNITY): Payer: Medicare Other

## 2018-02-20 DIAGNOSIS — Z9911 Dependence on respirator [ventilator] status: Secondary | ICD-10-CM

## 2018-02-20 LAB — MAGNESIUM: MAGNESIUM: 2.1 mg/dL (ref 1.7–2.4)

## 2018-02-20 LAB — RENAL FUNCTION PANEL
ANION GAP: 10 (ref 5–15)
Albumin: 1.6 g/dL — ABNORMAL LOW (ref 3.5–5.0)
Albumin: 1.4 g/dL — ABNORMAL LOW (ref 3.5–5.0)
Anion gap: 9 (ref 5–15)
BUN: 69 mg/dL — ABNORMAL HIGH (ref 8–23)
BUN: 78 mg/dL — ABNORMAL HIGH (ref 8–23)
CHLORIDE: 111 mmol/L (ref 98–111)
CO2: 17 mmol/L — AB (ref 22–32)
CO2: 22 mmol/L (ref 22–32)
Calcium: 7.8 mg/dL — ABNORMAL LOW (ref 8.9–10.3)
Calcium: 7.4 mg/dL — ABNORMAL LOW (ref 8.9–10.3)
Chloride: 114 mmol/L — ABNORMAL HIGH (ref 98–111)
Creatinine, Ser: 4.34 mg/dL — ABNORMAL HIGH (ref 0.44–1.00)
Creatinine, Ser: 4.72 mg/dL — ABNORMAL HIGH (ref 0.44–1.00)
GFR calc Af Amer: 11 mL/min — ABNORMAL LOW (ref >60)
GFR calc Af Amer: 10 mL/min — ABNORMAL LOW (ref >60)
GFR calc non Af Amer: 10 mL/min — ABNORMAL LOW (ref >60)
GFR calc non Af Amer: 9 mL/min — ABNORMAL LOW (ref >60)
GLUCOSE: 111 mg/dL — AB (ref 70–99)
GLUCOSE: 108 mg/dL — AB (ref 70–99)
POTASSIUM: 5.1 mmol/L (ref 3.5–5.1)
POTASSIUM: 4.2 mmol/L (ref 3.5–5.1)
Phosphorus: 8.8 mg/dL — ABNORMAL HIGH (ref 2.5–4.6)
Phosphorus: 8.4 mg/dL — ABNORMAL HIGH (ref 2.5–4.6)
Sodium: 141 mmol/L (ref 135–145)
Sodium: 142 mmol/L (ref 135–145)

## 2018-02-20 LAB — POCT ACTIVATED CLOTTING TIME: Activated Clotting Time: 142 seconds

## 2018-02-20 LAB — GLUCOSE, CAPILLARY
GLUCOSE-CAPILLARY: 100 mg/dL — AB (ref 70–99)
GLUCOSE-CAPILLARY: 101 mg/dL — AB (ref 70–99)
GLUCOSE-CAPILLARY: 94 mg/dL (ref 70–99)
Glucose-Capillary: 99 mg/dL (ref 70–99)
Glucose-Capillary: 83 mg/dL (ref 70–99)

## 2018-02-20 LAB — COMPLEMENT, TOTAL: Compl, Total (CH50): 19 U/mL — ABNORMAL LOW (ref 42–999999)

## 2018-02-20 LAB — PROCALCITONIN: Procalcitonin: 0.31 ng/mL

## 2018-02-20 LAB — HEPARIN LEVEL (UNFRACTIONATED): Heparin Unfractionated: 0.39 IU/mL (ref 0.30–0.70)

## 2018-02-20 LAB — PHOSPHORUS: Phosphorus: 8.3 mg/dL — ABNORMAL HIGH (ref 2.5–4.6)

## 2018-02-20 MED ORDER — HEPARIN SODIUM (PORCINE) 1000 UNIT/ML DIALYSIS
1000.0000 [IU] | INTRAMUSCULAR | Status: DC | PRN
  Filled 2018-02-20: qty 6

## 2018-02-20 MED ORDER — VITAL HIGH PROTEIN PO LIQD: 1000.0000 mL | ORAL | Status: DC

## 2018-02-20 MED ORDER — ADULT MULTIVITAMIN LIQUID CH
15.0000 mL | Freq: Every day | ORAL | Status: DC
  Administered 2018-02-20 – 2018-02-23 (×4): 15 mL
  Filled 2018-02-20 (×4): qty 15

## 2018-02-20 MED ORDER — ROCURONIUM BROMIDE 50 MG/5ML IV SOLN
1.0000 mg/kg | Freq: Once | INTRAVENOUS | Status: AC
  Administered 2018-02-20: 50 mg via INTRAVENOUS

## 2018-02-20 MED ORDER — SODIUM CHLORIDE 0.9% FLUSH: 10.0000 mL | INTRAVENOUS | Status: DC | PRN

## 2018-02-20 MED ORDER — VITAL AF 1.2 CAL PO LIQD
1000.0000 mL | ORAL | Status: DC
  Administered 2018-02-20: 1000 mL
  Administered 2018-02-22: 1500 mL
  Administered 2018-02-23: 1000 mL

## 2018-02-20 MED ORDER — HEPARIN (PORCINE) 2000 UNITS/L FOR CRRT
INTRAVENOUS_CENTRAL | Status: DC | PRN
  Administered 2018-02-20 – 2018-02-21 (×2): via INTRAVENOUS_CENTRAL
  Filled 2018-02-20 (×2): qty 1000

## 2018-02-20 MED ORDER — PRO-STAT SUGAR FREE PO LIQD
30.0000 mL | Freq: Two times a day (BID) | ORAL | Status: DC
  Administered 2018-02-20 – 2018-02-23 (×7): 30 mL
  Filled 2018-02-20 (×7): qty 30

## 2018-02-20 MED ORDER — PRISMASOL BGK 4/2.5 32-4-2.5 MEQ/L IV SOLN
INTRAVENOUS | Status: DC
  Administered 2018-02-20: 17:00:00 via INTRAVENOUS_CENTRAL
  Administered 2018-02-21: 1000 mL/h via INTRAVENOUS_CENTRAL
  Administered 2018-02-21: 11:00:00 via INTRAVENOUS_CENTRAL
  Administered 2018-02-21 – 2018-02-22 (×4): 1000 mL/h via INTRAVENOUS_CENTRAL
  Administered 2018-02-22 (×2): via INTRAVENOUS_CENTRAL
  Administered 2018-02-22: 1000 mL/h via INTRAVENOUS_CENTRAL
  Administered 2018-02-23 (×2): via INTRAVENOUS_CENTRAL
  Administered 2018-02-23: 1000 mL/h via INTRAVENOUS_CENTRAL
  Administered 2018-02-23: 23:00:00 via INTRAVENOUS_CENTRAL
  Administered 2018-02-23: 1000 mL/h via INTRAVENOUS_CENTRAL
  Administered 2018-02-24: 04:00:00 via INTRAVENOUS_CENTRAL
  Filled 2018-02-20 (×28): qty 5000

## 2018-02-20 MED ORDER — DEXAMETHASONE SODIUM PHOSPHATE 4 MG/ML IJ SOLN
4.0000 mg | Freq: Two times a day (BID) | INTRAMUSCULAR | Status: DC
  Administered 2018-02-20 – 2018-02-24 (×8): 4 mg via INTRAVENOUS
  Filled 2018-02-20 (×8): qty 1

## 2018-02-20 MED ORDER — CHLORHEXIDINE GLUCONATE CLOTH 2 % EX PADS
6.0000 | MEDICATED_PAD | Freq: Every day | CUTANEOUS | Status: DC
  Administered 2018-02-21 – 2018-02-24 (×4): 6 via TOPICAL

## 2018-02-20 MED ORDER — PRISMASOL BGK 4/2.5 32-4-2.5 MEQ/L REPLACEMENT SOLN
Status: DC
  Administered 2018-02-20: 17:00:00 via INTRAVENOUS_CENTRAL
  Administered 2018-02-21: 500 mL via INTRAVENOUS_CENTRAL
  Administered 2018-02-21 – 2018-02-23 (×4): via INTRAVENOUS_CENTRAL
  Administered 2018-02-23: 500 mL via INTRAVENOUS_CENTRAL
  Administered 2018-02-24: 04:00:00 via INTRAVENOUS_CENTRAL
  Filled 2018-02-20 (×11): qty 5000

## 2018-02-20 MED ORDER — PRISMASOL BGK 4/2.5 32-4-2.5 MEQ/L REPLACEMENT SOLN
Status: DC
  Administered 2018-02-20: 17:00:00 via INTRAVENOUS_CENTRAL
  Administered 2018-02-21 (×2): 300 mL via INTRAVENOUS_CENTRAL
  Administered 2018-02-22 – 2018-02-24 (×3): via INTRAVENOUS_CENTRAL
  Filled 2018-02-20 (×11): qty 5000

## 2018-02-20 MED ORDER — FREE WATER
30.0000 mL | Status: DC
  Administered 2018-02-20 – 2018-02-23 (×17): 30 mL

## 2018-02-20 MED ORDER — HEPARIN SODIUM (PORCINE) 1000 UNIT/ML DIALYSIS
1000.0000 [IU] | INTRAMUSCULAR | Status: DC | PRN
  Filled 2018-02-20 (×2): qty 6

## 2018-02-20 MED ORDER — CHLORHEXIDINE GLUCONATE CLOTH 2 % EX PADS
6.0000 | MEDICATED_PAD | Freq: Every day | CUTANEOUS | Status: DC
  Administered 2018-02-22 – 2018-02-23 (×2): 6 via TOPICAL

## 2018-02-20 MED FILL — Medication: Qty: 1 | Status: AC

## 2018-02-20 NOTE — Procedures (Signed)
Hemodialysis Insertion Procedure Note Lester KinsmanDiane Fahy [id number] 1950/08/16  Procedure: Insertion of Hemodialysis Catheter Type: 3 port  Indications: Hemodialysis   Procedure Details Consent: Risks of procedure as well as the alternatives and risks of each were explained to the (patient/caregiver).  Consent for procedure obtained. Time Out: Verified patient identification, verified procedure, site/side was marked, verified correct patient position, special equipment/implants available, medications/allergies/relevent history reviewed, required imaging and test results available.  Performed  Maximum sterile technique was used including antiseptics, cap, gloves, gown, hand hygiene, mask and sheet. Skin prep: Chlorhexidine; local anesthetic administered A antimicrobial bonded/coated triple lumen catheter was placed in the right internal jugular vein using the Seldinger technique. Ultrasound guidance used.Yes.     Right IJ V  Catheter placed to 16 cm. Blood aspirated via all 3 ports and then flushed x 3. Line sutured x 2 and dressing applied.  Evaluation Blood flow good Complications: No apparent complications Patient did tolerate procedure well. Chest X-ray ordered to verify placement.  CXR: pending.  Brett CanalesSteve  ACNP [facility] Pager [phone] till 1 pm If no answer page [phone] 02/20/2018, 10:22 AM

## 2018-02-20 NOTE — Progress Notes (Addendum)
Nutrition Follow Up Note   DOCUMENTATION CODES:   Not applicable  INTERVENTION:   Initiate Vital 1.2 @ 4830ml/hr + Prostat 30ml BID via tube  Propofol: 5.24 ml/hr- provides 138kcal/day   Free water flushes 30ml q 4 hrs  Regimen provides 1202kcal/day, 84g/day protein, 75764ml/day free water  Liquid MVI daily via tube   Pt at high refeeding risk; recommend monitor K, Mg and P labs   NUTRITION DIAGNOSIS:   Inadequate oral intake related to acute illness as evidenced by NPO status.  GOAL:   Patient will meet greater than or equal to 90% of their needs  MONITOR:   Vent status, Labs, Weight trends, TF tolerance, Skin, I & O's  REASON FOR ASSESSMENT:   Consult Enteral/tube feeding initiation and management  ASSESSMENT:    67 yo female admitted on 11/07 with left abdominal pain and found to have splenic infart with unclear etiology, further complicated by AKI. Pt additionally found to have ischemic bowel. Pt developed angioedema on 11/13 requiring intubation. PMH includes mixed connective tissue disorder, UIP, anemia   11/07 Admitted 11/11 CT abdomen concerning for possible ischemic enteritis of jejunum; GI consulted 11/13 Developed angioedema, transferred to ICU and intubated for airway protection  Pt remains intubated and ventilated. Will plan to start tube feeds today. OGT in place noted in fundus of stomach. Per chart, pt with weight loss since admit; RD will continue to monitor.   Patient is currently intubated on ventilator support MV: 7.2 L/min Temp (24hrs), Avg:97.8 F (36.6 C), Min:97.1 F (36.2 C), Max:98.3 F (36.8 C)  Propofol: 5.24 ml/hr- provides 138kcal/day   MAP- >6960mmHg  Diet Order:   Diet Order            Diet NPO time specified  Diet effective now             EDUCATION NEEDS:   Not appropriate for education at this time  Skin:  Skin Assessment: Reviewed RN Assessment  Last BM:  11/10  Height:   Ht Readings from Last 1 Encounters:   02/18/18 5' (1.524 m)    Weight:   Wt Readings from Last 1 Encounters:  02/20/18 54.9 kg    Ideal Body Weight:     BMI:  Body mass index is 23.64 kg/m.  Estimated Nutritional Needs:   Kcal:  1100 kcals   Protein:  80-93 g   Fluid:  >/= 1.5 L  Betsey Holidayasey Kerman Pfost MS, RD, LDN Pager #- [phone] Office#- [phone] After Hours Pager: [phone]\

## 2018-02-20 NOTE — Progress Notes (Signed)
S:No new events overnight O:BP (!) 127/57   Pulse 64   Temp 98.3 F (36.8 C) (Oral)   Resp 20   Ht 5' (1.524 m)   Wt 54.9 kg   SpO2 100%   BMI 23.64 kg/m   Intake/Output Summary (Last 24 hours) at 02/20/2018 1224 Last data filed at 02/20/2018 1200 Gross per 24 hour  Intake 1608.67 ml  Output 0 ml  Net 1608.67 ml   Intake/Output: I/O last 3 completed shifts: In: 3614.3 [I.V.:3514.3; IV Piggyback:100] Out: 0   Intake/Output this shift:  Total I/O In: 381.2 [I.V.:331.1; IV Piggyback:50] Out: 0  Weight change:  Gen: intubated/sedated, +tongue swelling CVS: no rub Resp: occ rhonchi Abd: benign Ext:no edema  Recent Labs  Lab 02/14/18 0525 02/15/18 0616 02/16/18 0431 02/17/18 0603 02/18/18 0536 02/19/18 0259 02/20/18 0240  NA 141 141 139 139 139 140 141  K 3.8 3.6 3.6 4.1 3.9 4.6 5.1  CL 114* 113* 111 111 114* 116* 114*  CO2 20* 23 21* 24 20* 16* 17*  GLUCOSE 87 91 131* 120* 123* 169* 111*  BUN 20 17 17 22  26* 42* 69*  CREATININE 1.71* 1.69* 1.57* 1.88* 1.89* 2.73* 4.34*  ALBUMIN  --   --  1.8*  1.8* 1.8* 1.7* 1.3* 1.6*  CALCIUM 8.0* 8.3* 8.4* 8.2* 8.3* 7.7* 7.8*  PHOS  --   --  4.5 4.5  --  7.0* 8.8*  AST  --   --  57*  --  25  --   --   ALT  --   --  48*  --  23  --   --    Liver Function Tests: Recent Labs  Lab 02/16/18 0431  02/18/18 0536 02/19/18 0259 02/20/18 0240  AST 57*  --  25  --   --   ALT 48*  --  23  --   --   ALKPHOS 78  --  68  --   --   BILITOT 0.4  --  0.4  --   --   PROT 7.7  --  6.9  --   --   ALBUMIN 1.8*  1.8*   < > 1.7* 1.3* 1.6*   < > = values in this interval not displayed.   Recent Labs  Lab 02/16/18 0431  LIPASE 27   No results for input(s): AMMONIA in the last 168 hours. CBC: Recent Labs  Lab 02/14/18 0525 02/15/18 0616 02/16/18 0431 02/17/18 0603 02/18/18 0536 02/19/18 0259  WBC 3.1* 3.4* 4.2 7.9 10.6* 10.6*  NEUTROABS 2.3 2.4  --  6.5  --   --   HGB 7.9* 8.4* 9.5* 9.3* 8.6* 7.1*  HCT 27.5* 27.9* 32.4*  30.8* 28.8* 24.6*  MCV 90.8 89.4 89.5 90.6 90.0 93.5  PLT 224 201 261 230 198 166   Cardiac Enzymes: No results for input(s): CKTOTAL, CKMB, CKMBINDEX, TROPONINI in the last 168 hours. CBG: Recent Labs  Lab 02/19/18 1631 02/19/18 1925 02/19/18 2310 02/20/18 0316 02/20/18 0749  GLUCAP 99 95 103* 99 100*    Iron Studies: No results for input(s): IRON, TIBC, TRANSFERRIN, FERRITIN in the last 72 hours. Studies/Results: Ct Soft Tissue Neck Wo Contrast  Result Date: 02/18/2018 CLINICAL DATA:  Angioedema. EXAM: CT NECK WITHOUT CONTRAST TECHNIQUE: Multidetector CT imaging of the neck was performed following the standard protocol without intravenous contrast. COMPARISON:  None. FINDINGS: PHARYNX AND LARYNX: Patient is intubated. The tip of the endotracheal tube is at the carina. There is diffuse edema  of the pharynx and larynx with effacement of all laryngopharyngeal spaces. SALIVARY GLANDS: Asymmetric enlargement of the left parotid and submandibular glands with mild surrounding edema and thickening of the left platysma muscle. THYROID: Normal. LYMPH NODES: Numerous bilateral subcentimeter lymph nodes. No enlarged or abnormal density nodes. VASCULAR: Limited assessment without IV contrast. LIMITED INTRACRANIAL: Normal. VISUALIZED ORBITS: Normal. MASTOIDS AND VISUALIZED PARANASAL SINUSES: No fluid levels or advanced mucosal thickening. No mastoid effusion. SKELETON: No bony spinal canal stenosis. No lytic or blastic lesions. UPPER CHEST: Incompletely visualized left lower lobe consolidation. OTHER: None. IMPRESSION: 1. Diffuse edema of the pharynx and larynx, consistent with angioedema. 2. Enlarged left parotid and submandibular glands with surrounding edema and thickening of the platysma muscle. This is of uncertain etiology, but may be part of the same process causing the diffuse airway edema. 3. Endotracheal tube tip is just above the carina. Retraction 4 cm would place it at the level of the  clavicular heads. 4. Incompletely visualized dependent consolidation within the superior portion of left lower lobe. Electronically Signed   By: Ulyses Jarred M.D.   On: 02/18/2018 18:21   Dg Chest Port 1 View  Result Date: 02/20/2018 CLINICAL DATA:  Status post central line placement. EXAM: PORTABLE CHEST 1 VIEW COMPARISON:  Portable chest x-ray of earlier today. FINDINGS: There has been interval placement of right internal jugular venous catheter whose tip projects over the junction of the proximal and middle thirds of the SVC. There is no postprocedure pneumothorax or hemothorax. The endotracheal and esophagogastric tubes appears stable. The lungs are reasonably well inflated. There is bibasilar atelectasis or pneumonia which is stable. The heart and pulmonary vascularity are normal. IMPRESSION: No postprocedure complication following right internal jugular venous catheter placement. Electronically Signed   By: David  Martinique M.D.   On: 02/20/2018 10:41   Dg Chest Port 1 View  Result Date: 02/20/2018 CLINICAL DATA:  Acute respiratory failure EXAM: PORTABLE CHEST 1 VIEW COMPARISON:  02/18/2018 FINDINGS: Endotracheal tube 3.4 cm above the carina. NG tube within the proximal stomach. Monitor leads overlie the chest. Improving left lower lobe partial collapse/consolidation. Chronic appearing bibasilar fibrotic changes. No enlarging effusion or significant pneumothorax. Aorta atherosclerotic. IMPRESSION: Improving left basilar partial collapse/consolidation Chronic appearing bibasilar interstitial fibrotic pattern and persistent low lung volumes Stable support apparatus Electronically Signed   By: Jerilynn Mages.  Shick M.D.   On: 02/20/2018 09:39   Dg Chest Port 1 View  Result Date: 02/18/2018 CLINICAL DATA:  67 year old female status post intubation and gastric tube placement EXAM: PORTABLE CHEST 1 VIEW COMPARISON:  Prior chest x-ray 02/25/2018 FINDINGS: The endotracheal tube tip is 2.8 cm above the carina. A  nasogastric tube is present in the tip overlies the gastric fundus. Bibasilar airspace opacities, similar compared to the prior imaging. Subpleural reticulation and honeycombing was present on the prior CT scan. Suspect layering left pleural effusion. Chronic background bronchitic changes. No evidence of pulmonary edema or pneumothorax. No acute osseous abnormality. IMPRESSION: 1. The tip of the endotracheal tube is well positioned nearly 3 cm above the carina. 2. The nasogastric tube projects over the gastric fundus. 3. Persistent low inspiratory volumes with bibasilar atelectasis worse on the left than the right. 4. Small layering left pleural effusion. 5. Lower lobe fibrotic changes. Electronically Signed   By: Jacqulynn Cadet M.D.   On: 02/18/2018 16:00   . chlorhexidine gluconate (MEDLINE KIT)  15 mL Mouth Rinse BID  . Chlorhexidine Gluconate Cloth  6 each Topical Daily  . [START ON  02/21/2018] Chlorhexidine Gluconate Cloth  6 each Topical Q0600  . dexamethasone  4 mg Intravenous Q6H  . diphenhydrAMINE  25 mg Intravenous Q8H  . fentaNYL (SUBLIMAZE) injection  50 mcg Intravenous Once  . insulin aspart  0-9 Units Subcutaneous Q4H  . mouth rinse  15 mL Mouth Rinse 10 times per day  . sodium chloride flush  3 mL Intravenous Q12H    BMET    Component Value Date/Time   NA 141 02/20/2018 0240   NA 138 12/20/2013   K 5.1 02/20/2018 0240   CL 114 (H) 02/20/2018 0240   CO2 17 (L) 02/20/2018 0240   GLUCOSE 111 (H) 02/20/2018 0240   BUN 69 (H) 02/20/2018 0240   BUN 20 12/20/2013   CREATININE 4.34 (H) 02/20/2018 0240   CREATININE 0.90 11/18/2016 1350   CALCIUM 7.8 (L) 02/20/2018 0240   GFRNONAA 10 (L) 02/20/2018 0240   GFRNONAA 67 11/18/2016 1350   GFRAA 11 (L) 02/20/2018 0240   GFRAA 78 11/18/2016 1350   CBC    Component Value Date/Time   WBC 10.6 (H) 02/19/2018 0259   RBC 2.63 (L) 02/19/2018 0259   HGB 7.1 (L) 02/19/2018 0259   HCT 24.6 (L) 02/19/2018 0259   PLT 166 02/19/2018 0259    MCV 93.5 02/19/2018 0259   MCH 27.0 02/19/2018 0259   MCHC 28.9 (L) 02/19/2018 0259   RDW 16.4 (H) 02/19/2018 0259   LYMPHSABS 0.6 (L) 02/17/2018 0603   MONOABS 0.7 02/17/2018 0603   EOSABS 0.0 02/17/2018 0603   BASOSABS 0.0 02/17/2018 0603    Assessment/Plan:  1. VDRF due to acute onset angioedema- unclear what the offending agent is.  She did report trying viscous lidocaine with "gagging and throat closing" yesterday and unclear if she drank more afterwards.  Appreciate PCCM assistance with her care.  2. AKI/CKD in setting of splenic infarct, IV contrast and volume depletion with concomitant ACE-inhibition and now with VDRF and angioedema. Cr was slowly improving, however given N/V and acute angioedema, Cr has worsened.  Unable to perform renal biopsy due to need for anticoagulation and the development of ischemic bowel off of heparin.  Continue to hold lisinopril and follow renal function.  1. GN workup underway with serologies 2. Now with progressive oliguric AKI with hyperkalemia.  Will plan for HD today and cont to follow for renal recovery. 3. She did not want to change imuran to cellcept prior to her angioedema but I did discuss this with Dr. Trudie Reed who was in favor of the change (the Imuran was for her pulmonary fibrosis).  4. Renal biopsy still on hold due to her ischemic bowel off of heparin gtt and now with VDRF.Hopefully we can perform one early next week if she stabilizes. 5. 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  Would hold off on cytoxan or cellcept for now given her angioedema of unclear etiology.  3. Abdominal pain with N/V- w/uwith CT revealed possible ischemic enteritis of jejunum and GI has been consulted for further evaluation and management. She is back on heparin and pain is intermittent but somewhat improved. 4. Splenic infarction, acute- is  on IV heparin but was put on hold for biopsy, cont to hold for now.Hypercoagulable workup negative thus far. 5. Proteinuria- as above, hold off on bx for now. 6. HTN- will start bp meds and follow (lisinopril on hold due to AKI/CKD).  Will add amlodipine and may need clonidine prn.  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 Idiopathic Pulmonary Fibrosis- on imuran  Donetta Potts, MD [facility] [phone]

## 2018-02-20 NOTE — Telephone Encounter (Signed)
Left message on voicemail for patient to call back and schedule procedure and pv in [facility] or ov in [facility] if she prefers that first.

## 2018-02-20 NOTE — Progress Notes (Addendum)
ANTICOAGULATION CONSULT NOTE - Follow Up Consult  Pharmacy Consult for Heparin Indication: Splenic infarct  Patient Measurements: Height: 5' (152.4 cm) Weight: 121 lb 0.5 oz (54.9 kg) IBW/kg (Calculated) : 45.5 Heparin Dosing Weight: 55.8 kg  Vital Signs: Temp: 97.9 F (36.6 C) (11/15 0730) Temp Source: Axillary (11/15 0730) BP: 124/61 (11/15 0812) Pulse Rate: 61 (11/15 0812)  Labs: Recent Labs    02/18/18 0536 02/19/18 0259 02/19/18 1042 02/19/18 1857 02/20/18 0240  HGB 8.6* 7.1*  --   --   --   HCT 28.8* 24.6*  --   --   --   PLT 198 166  --   --   --   HEPARINUNFRC 0.55 0.74* 0.52 0.36 0.39  CREATININE 1.89* 2.73*  --   --  4.34*    Estimated Creatinine Clearance: 9.9 mL/min (A) (by C-G formula based on SCr of 4.34 mg/dL (H)).   Medications:  Infusions:  . sodium chloride 10 mL/hr at 02/20/18 0835  . famotidine (PEPCID) IV    . fentaNYL infusion INTRAVENOUS 75 mcg/hr (02/20/18 0300)  . heparin 900 Units/hr (02/19/18 1953)  . propofol (DIPRIVAN) infusion 10 mcg/kg/min (02/20/18 0549)  .  sodium bicarbonate  infusion 1000 mL 50 mL/hr at 02/19/18 1715    Assessment: Madeline Harper who presented on 11/3 with L-flank pain and was found to have a splenic infarct and now with imaging showing ischemic colitis - currently evaluating for source. Pharmacy consulted to dose Heparin for anticoagulation.  Emergently intubated 11/13 due to angioedema and heparin infusion was stopped upon transfer to 2H. CT neck showing angioedema but no indications of hematoma. Heparin infusion okay to resume per CCM.  Heparin level remains therapeutic this morning at 0.39. Last Hgb on 11/14 trending down to 7.1, platelets within normal limits, and no overt bleeding noted. No issues with infusion overnight per RN.    Goal of Therapy:  Heparin level 0.3-0.7 units/ml Monitor platelets by anticoagulation protocol: Yes   Plan:  Continue heparin infusion at 900 units/hr Daily heparin level and CBC  while on heparin Monitor s/sx bleeding, plans to transition to PO anticoagulation   Thank you for allowing pharmacy to be a part of this patient's care.  Harlow MaresAmy Aadin Gaut, PharmD PGY1 Pharmacy Resident Phone [phone]  02/20/2018   8:36 AM   Addendum:  0830: Heparin infusion is currently being held for HD catheter placement this morning. Will follow up with CCM about restarting heparin after cath placement.  1200: Heparin infusion restarted status post HD cath placement; will recheck heparin level with morning labs 11/16

## 2018-02-20 NOTE — Care Management Note (Signed)
Case Management Note  Patient Details  Name: Madeline Harper MRN: [id number] Date of Birth: June 17, 1950  Subjective/Objective:  67yo female presented from home with c/o left flank pain, found to have splenic infarct.                  Action/Plan: Patient developed acute angioedema with ICU transfer and remains critically ill on vent support. CM team will continue following for dispositional needs.  Expected Discharge Date:                  Expected Discharge Plan:     In-House Referral:  NA  Discharge planning Services  CM Consult  Post Acute Care Choice:  NA Choice offered to:  NA  DME Arranged:  N/A DME Agency:  NA  HH Arranged:  NA HH Agency:  NA  Status of Service:  In process, will continue to follow  If discussed at Long Length of Stay Meetings, dates discussed:    Additional Comments:  Colleen CanNatalie Yaacov Koziol RN, BSN, NCM-BC, ACM-RN [phone] 02/20/2018, 1:41 PM

## 2018-02-20 NOTE — Progress Notes (Signed)
NAME:  Madeline KinsmanDiane Coughlin, MRN:  [id number], DOB:  05/11/1950, LOS: 11 ADMISSION DATE:  03/06/2018, CONSULTATION DATE:  02/18/2018 REFERRING MD:  Dr. Mal MistyNetty, CHIEF COMPLAINT:  Angioedema  Brief History   [age] yoF w/PMH of MCTD/ UIP on plaquenil and Imuran admitted with left abd pain found to have splenic infarct with unclear etiology complicated by AKI.  Additionally found to have ischemic bowel and placed on heparin gtt.  Developed acute angioedema 11/13 requiring emergent intubation.   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  11/14 tongue swelling improved. UOP dropping, acid base worse. Cr increased.   Consults:  11/5 ID  11/5 cardiology 11/8 Nephrology 11/12 GI 11/13 PCCM   Procedures:  11/5 TEE >> LVEF 55-60%, no evidence of IE, could not rule out small PFO 11/13 ETT >>  Significant Diagnostic Tests:  11/3 CT abd/pelvis >> 1. Large subcapsular splenic infarct. Findings may be related to an embolic process possibly originating from the heart such as infective endocarditis. Clinical correlation is recommended. 2. Focal area of wall apparent thickening or thrombus in the midportion of the splenic artery with associated luminal narrowing.  3. Mildly enlarged retroperitoneal and right common iliac lymph nodes. 4. Sigmoid diverticulosis. No bowel obstruction or active inflammation. Normal appendix. 5. Possible small left ovarian dermoid.  11/5 TEE >> LVEF 55-60%, no evidence of IE, could not rule out small PFO  11/11 CT abd/pelvis >> 1. Markedly abnormal loop of proximal jejunum extending into the mid jejunum with an edematous appearance. The wall is markedly thickened. Findings c/w indeterminate enteritis which could be ischemic, infectious, or inflammatory.  2. New left effusion. 3. The opacity underlying the new left  effusion could represent atelectasis or pneumonia. 4. Gallbladder is distended but stable.  5. Atherosclerosis in the nonaneurysmal aorta. 6. Suspected splenic infarcts again noted. Ct head/neck: 1. Diffuse edema of the pharynx and larynx, consistent with angioedema.2. Enlarged left parotid and submandibular glands with surrounding edema and thickening of the platysma muscle. This is of uncertain etiology, but may be part of the same process causing the diffuse airway edema. Micro Data:  11/3 BC x 3 >> negative 11/13 MRSA PCR >>  Antimicrobials:  none  Interim history/subjective:  Sedated on full ventilatory support Plan to place hemodialysis catheter   Objective   Blood pressure (!) 122/58, pulse 62, temperature 97.9 F (36.6 C), temperature source Axillary, resp. rate 18, height 5' (1.524 m), weight 54.9 kg, SpO2 100 %.    Vent Mode: PRVC FiO2 (%):  [30 %] 30 % Set Rate:  [18 bmp] 18 bmp Vt Set:  [360 mL] 360 mL PEEP:  [5 cmH20] 5 cmH20 Plateau Pressure:  [13 cmH20-19 cmH20] 19 cmH20   Intake/Output Summary (Last 24 hours) at 02/20/2018 0940 Last data filed at 02/20/2018 0800 Gross per 24 hour  Intake 1536.32 ml  Output 0 ml  Net 1536.32 ml   Filed Weights   02/22/2018 0350 02/17/18 0523 02/20/18 0500  Weight: 59.9 kg 58.2 kg 54.9 kg    Examination: General: Thin female sedated on vent HEENT: Angioedema has decreased Neuro: When not sedated follows commands CV: s1s2 rrr, no m/r/g PULM: even/non-labored, lungs bilaterally clear to auscultation WU:JWJXGI:soft, non-tender, bsx4 active  Extremities: warm/dry, negative edema  Skin: no rashes or lesions   Resolved Hospital Problem list     Assessment &  Plan:   Angioedema / Emergent Airway Obstruction  -acute onset tongue/neck swelling 11/13.  Sore throat began 11/12 (was also started on heparin at that time due to splenic infarct) -etiology unclear  P: Continue current treatment  Acute Respiratory Insufficiency  in setting of Airway Obstruction  P: Full vent support She is adequately ventilated but she does not have an airway with the tongue swelling Consideration for extubation until the swelling is resolved  Splenic Infarct  -concern for possible SLE / mixed connective tissue process -?small PFO on TTE P: Cont heparin gtt Gi following  Abdominal Pain with Nausea / Vomiting  -CT concerning possible ischemic enteritis of the jejunum P: Continue IV heparin  hematological consult noted  AKI on CKD -recent IV contrast, volume depletion, ACE-I use -renal fxn worse.  P: Placed hemodialysis catheter Follow recommendations of renal service  Fluid and electrolyte imbalance: NAGMA, hyperchloremia  -acid base worse  -K rising Plan Place hemodialysis catheter Follow recommendations renal consult  Iron Deficiency Anemia P: Transfuse per protocol  Fever  Pro calcitonin 0.31, white count 10.6 P: Continue to follow fever curve No antibiotics at this time  Mixed Connective Tissue Disease / Possible SLE -followed by Dr. Lendon Colonel -on Plaquenil & Imuran at baseline -negative antiphospholipid, lupus anticoagulant, beta-2 glycoprotein, LDH, uric acid, protein C/S, blood cultures x2 negative  P: Holding Plaquenil and Imuran  HTN P: Hold ACE inhibitor Continue Norvasc  app cct 35 min Best practice:  Diet: NPO Pain/Anxiety/Delirium protocol (if indicated): Propofol, fentanyl gtt's VAP protocol (if indicated): in place  DVT prophylaxis: Heparin gtt  GI prophylaxis: Pepcid  Glucose control: n/a, monitor while on steroids  Mobility: bedrest  Code Status: full code  Family Communication:  Husband and her daughter updated at bedside 02/20/2018 Disposition: ICU   Brett Canales Minor ACNP [facility] PCCM Pager [phone] till 1 pm If no answer page [phone] 02/20/2018, 9:43 AM

## 2018-02-21 ENCOUNTER — Inpatient Hospital Stay (HOSPITAL_COMMUNITY): Payer: Medicare Other

## 2018-02-21 DIAGNOSIS — G9340 Encephalopathy, unspecified: Secondary | ICD-10-CM

## 2018-02-21 LAB — POCT I-STAT, CHEM 8
BUN: 35 mg/dL — AB (ref 8–23)
CHLORIDE: 103 mmol/L (ref 98–111)
CREATININE: 2.5 mg/dL — AB (ref 0.44–1.00)
Calcium, Ion: 1.05 mmol/L — ABNORMAL LOW (ref 1.15–1.40)
Glucose, Bld: 148 mg/dL — ABNORMAL HIGH (ref 70–99)
HEMATOCRIT: 26 % — AB (ref 36.0–46.0)
Hemoglobin: 8.8 g/dL — ABNORMAL LOW (ref 12.0–15.0)
POTASSIUM: 4.5 mmol/L (ref 3.5–5.1)
Sodium: 134 mmol/L — ABNORMAL LOW (ref 135–145)
TCO2: 29 mmol/L (ref 22–32)

## 2018-02-21 LAB — POCT I-STAT 3, ART BLOOD GAS (G3+)
Acid-Base Excess: 1 mmol/L (ref 0.0–2.0)
BICARBONATE: 26.4 mmol/L (ref 20.0–28.0)
O2 Saturation: 96 %
PH ART: 7.385 (ref 7.350–7.450)
PO2 ART: 75 mmHg — AB (ref 83.0–108.0)
Patient temperature: 94.7
TCO2: 28 mmol/L (ref 22–32)
pCO2 arterial: 43.2 mmHg (ref 32.0–48.0)

## 2018-02-21 LAB — GLUCOSE, CAPILLARY
GLUCOSE-CAPILLARY: 115 mg/dL — AB (ref 70–99)
GLUCOSE-CAPILLARY: 131 mg/dL — AB (ref 70–99)
Glucose-Capillary: 17 mg/dL — CL (ref 70–99)
Glucose-Capillary: 98 mg/dL (ref 70–99)
Glucose-Capillary: 133 mg/dL — ABNORMAL HIGH (ref 70–99)
Glucose-Capillary: 115 mg/dL — ABNORMAL HIGH (ref 70–99)

## 2018-02-21 LAB — CBC WITH DIFFERENTIAL/PLATELET
Abs Immature Granulocytes: 0.14 10*3/uL — ABNORMAL HIGH (ref 0.00–0.07)
BASOS ABS: 0 10*3/uL (ref 0.0–0.1)
BASOS PCT: 0 %
EOS ABS: 0 10*3/uL (ref 0.0–0.5)
EOS PCT: 0 %
HCT: 25.7 % — ABNORMAL LOW (ref 36.0–46.0)
Hemoglobin: 7.5 g/dL — ABNORMAL LOW (ref 12.0–15.0)
Immature Granulocytes: 1 %
Lymphocytes Relative: 4 %
Lymphs Abs: 0.4 10*3/uL — ABNORMAL LOW (ref 0.7–4.0)
MCH: 26.7 pg (ref 26.0–34.0)
MCHC: 29.2 g/dL — AB (ref 30.0–36.0)
MCV: 91.5 fL (ref 80.0–100.0)
Monocytes Absolute: 0.3 10*3/uL (ref 0.1–1.0)
Monocytes Relative: 3 %
NEUTROS PCT: 92 %
Neutro Abs: 9.8 10*3/uL — ABNORMAL HIGH (ref 1.7–7.7)
PLATELETS: 148 10*3/uL — AB (ref 150–400)
RBC: 2.81 MIL/uL — AB (ref 3.87–5.11)
RDW: 16.4 % — AB (ref 11.5–15.5)
WBC MORPHOLOGY: INCREASED
WBC: 10.7 10*3/uL — AB (ref 4.0–10.5)
nRBC: 0.8 % — ABNORMAL HIGH (ref 0.0–0.2)

## 2018-02-21 LAB — RENAL FUNCTION PANEL
ALBUMIN: 1.9 g/dL — AB (ref 3.5–5.0)
ANION GAP: 8 (ref 5–15)
Albumin: 1.7 g/dL — ABNORMAL LOW (ref 3.5–5.0)
Anion gap: 10 (ref 5–15)
BUN: 60 mg/dL — ABNORMAL HIGH (ref 8–23)
BUN: 31 mg/dL — ABNORMAL HIGH (ref 8–23)
CALCIUM: 7.8 mg/dL — AB (ref 8.9–10.3)
CHLORIDE: 105 mmol/L (ref 98–111)
CO2: 24 mmol/L (ref 22–32)
CO2: 24 mmol/L (ref 22–32)
CREATININE: 3.82 mg/dL — AB (ref 0.44–1.00)
CREATININE: 1.8 mg/dL — AB (ref 0.44–1.00)
Calcium: 7.8 mg/dL — ABNORMAL LOW (ref 8.9–10.3)
Chloride: 101 mmol/L (ref 98–111)
GFR calc non Af Amer: 11 mL/min — ABNORMAL LOW (ref >60)
GFR calc non Af Amer: 28 mL/min — ABNORMAL LOW (ref >60)
GFR, EST AFRICAN AMERICAN: 13 mL/min — AB (ref >60)
GFR, EST AFRICAN AMERICAN: 33 mL/min — AB (ref >60)
GLUCOSE: 102 mg/dL — AB (ref 70–99)
Glucose, Bld: 127 mg/dL — ABNORMAL HIGH (ref 70–99)
PHOSPHORUS: 3.6 mg/dL (ref 2.5–4.6)
Phosphorus: 6.1 mg/dL — ABNORMAL HIGH (ref 2.5–4.6)
Potassium: 4.6 mmol/L (ref 3.5–5.1)
Potassium: 4.3 mmol/L (ref 3.5–5.1)
SODIUM: 133 mmol/L — AB (ref 135–145)
Sodium: 139 mmol/L (ref 135–145)

## 2018-02-21 LAB — PHOSPHORUS: Phosphorus: 3.4 mg/dL (ref 2.5–4.6)

## 2018-02-21 LAB — MAGNESIUM
Magnesium: 2.3 mg/dL (ref 1.7–2.4)
Magnesium: 2.3 mg/dL (ref 1.7–2.4)

## 2018-02-21 LAB — HEPARIN LEVEL (UNFRACTIONATED): HEPARIN UNFRACTIONATED: 0.56 [IU]/mL (ref 0.30–0.70)

## 2018-02-21 LAB — TRIGLYCERIDES: TRIGLYCERIDES: 240 mg/dL — AB (ref <150)

## 2018-02-21 MED ORDER — NOREPINEPHRINE 4 MG/250ML-% IV SOLN
0.0000 ug/min | INTRAVENOUS | Status: DC
  Administered 2018-02-21: 5 ug/min via INTRAVENOUS
  Administered 2018-02-22: 7 ug/min via INTRAVENOUS
  Administered 2018-02-22: 10 ug/min via INTRAVENOUS
  Administered 2018-02-23: 9 ug/min via INTRAVENOUS
  Administered 2018-02-23: 12 ug/min via INTRAVENOUS
  Administered 2018-02-23: 14 ug/min via INTRAVENOUS
  Filled 2018-02-21 (×7): qty 250

## 2018-02-21 NOTE — Progress Notes (Signed)
ANTICOAGULATION CONSULT NOTE - Follow Up Consult  Pharmacy Consult for Heparin Indication: Splenic infarct  Patient Measurements: Height: 5' (152.4 cm) Weight: 121 lb 0.5 oz (54.9 kg) IBW/kg (Calculated) : 45.5 Heparin Dosing Weight: 55.8 kg  Vital Signs: Temp: 98 F (36.7 C) (11/16 1200) Temp Source: Oral (11/16 1200) BP: 94/64 (11/16 1300) Pulse Rate: 109 (11/16 1142)  Labs: Recent Labs    02/19/18 0259  02/19/18 1857 02/20/18 0240 02/20/18 1741 02/21/18 0504  HGB 7.1*  --   --   --   --  7.5*  HCT 24.6*  --   --   --   --  25.7*  PLT 166  --   --   --   --  148*  HEPARINUNFRC 0.74*   < > 0.36 0.39  --  0.56  CREATININE 2.73*  --   --  4.34* 4.72* 3.82*   < > = values in this interval not displayed.    Estimated Creatinine Clearance: 11.3 mL/min (A) (by C-G formula based on SCr of 3.82 mg/dL (H)).   Medications:  Infusions:  .  prismasol BGK 4/2.5 500 mL (02/21/18 0428)  .  prismasol BGK 4/2.5 300 mL (02/21/18 0429)  . sodium chloride Stopped (02/20/18 0908)  . famotidine (PEPCID) IV Stopped (02/21/18 1006)  . feeding supplement (VITAL AF 1.2 CAL) 30 mL/hr at 02/21/18 0700  . fentaNYL infusion INTRAVENOUS 200 mcg/hr (02/21/18 1312)  . heparin Stopped (02/21/18 1136)  . heparin 999 mL/hr at 02/20/18 1624  . norepinephrine (LEVOPHED) Adult infusion 5 mcg/min (02/21/18 1300)  . prismasol BGK 4/2.5 1,000 mL/hr at 02/21/18 1038  . propofol (DIPRIVAN) infusion 50 mcg/kg/min (02/21/18 1300)    Assessment: 89 YOF who presented on 11/3 with L-flank pain and was found to have a splenic infarct and now with imaging showing ischemic colitis - currently evaluating for source. Pharmacy consulted to dose Heparin for anticoagulation.  Emergently intubated 11/13 due to angioedema and heparin infusion was stopped upon transfer to 2H. CT neck showing angioedema but no indications of hematoma. Heparin infusion okay to resume per CCM. Heparin stopped again 11/15 got HD cath  placement and restarted Heparin level 0.56 remains therapeutic this morning on heparin drip 900 uts/hr. H/H low stable will moonitor, platelets within normal limits, and no overt bleeding noted. No issues with infusion overnight per RN.  Heparin again on hold for A-line placement then will restart this afternoon - no need to recheck levels until am  Goal of Therapy:  Heparin level 0.3-0.7 units/ml Monitor platelets by anticoagulation protocol: Yes   Plan:  Continue heparin infusion at 900 units/hr Daily heparin level and CBC while on heparin Monitor s/sx bleeding     Leota SauersLisa Kindle Strohmeier Pharm.D. CPP, BCPS Clinical Pharmacist [phone] 02/21/2018 2:26 PM

## 2018-02-21 NOTE — Progress Notes (Signed)
S:No events overnight.  Tolerating CVVHD O:BP 108/67   Pulse 89   Temp (!) 97.4 F (36.3 C) (Oral)   Resp 20   Ht 5' (1.524 m)   Wt 54.9 kg   SpO2 100%   BMI 23.64 kg/m   Intake/Output Summary (Last 24 hours) at 02/21/2018 1112 Last data filed at 02/21/2018 1100 Gross per 24 hour  Intake 2602.46 ml  Output 2932 ml  Net -329.54 ml   Intake/Output: I/O last 3 completed shifts: In: 3331.9 [I.V.:3010.8; NG/GT:271; IV Piggyback:50] Out: 2197 [Urine:415; Other:1782]  Intake/Output this shift:  Total I/O In: 604.8 [P.O.:60; I.V.:374.9; NG/GT:120; IV Piggyback:49.9] Out: 735 [Other:735] Weight change:  Gen: intubated, sedated, +angioedema of tongue (improved from yesterday) CVS: no rub Resp: scattered rhonchi bilaterally Abd: +BS, soft Ext: no edema  Recent Labs  Lab 02/16/18 0431 02/17/18 0603 02/18/18 0536 02/19/18 0259 02/20/18 0240 02/20/18 1740 02/20/18 1741 02/21/18 0504  NA 139 139 139 140 141  --  142 139  K 3.6 4.1 3.9 4.6 5.1  --  4.2 4.6  CL 111 111 114* 116* 114*  --  111 105  CO2 21* 24 20* 16* 17*  --  22 24  GLUCOSE 131* 120* 123* 169* 111*  --  108* 102*  BUN 17 22 26* 42* 69*  --  78* 60*  CREATININE 1.57* 1.88* 1.89* 2.73* 4.34*  --  4.72* 3.82*  ALBUMIN 1.8*  1.8* 1.8* 1.7* 1.3* 1.6*  --  1.4* 1.7*  CALCIUM 8.4* 8.2* 8.3* 7.7* 7.8*  --  7.4* 7.8*  PHOS 4.5 4.5  --  7.0* 8.8* 8.3* 8.4* 6.1*  AST 57*  --  25  --   --   --   --   --   ALT 48*  --  23  --   --   --   --   --    Liver Function Tests: Recent Labs  Lab 02/16/18 0431  02/18/18 0536  02/20/18 0240 02/20/18 1741 02/21/18 0504  AST 57*  --  25  --   --   --   --   ALT 48*  --  23  --   --   --   --   ALKPHOS 78  --  68  --   --   --   --   BILITOT 0.4  --  0.4  --   --   --   --   PROT 7.7  --  6.9  --   --   --   --   ALBUMIN 1.8*  1.8*   < > 1.7*   < > 1.6* 1.4* 1.7*   < > = values in this interval not displayed.   Recent Labs  Lab 02/16/18 0431  LIPASE 27   No results  for input(s): AMMONIA in the last 168 hours. CBC: Recent Labs  Lab 02/15/18 0616 02/16/18 0431 02/17/18 0603 02/18/18 0536 02/19/18 0259 02/21/18 0504  WBC 3.4* 4.2 7.9 10.6* 10.6* 10.7*  NEUTROABS 2.4  --  6.5  --   --  9.8*  HGB 8.4* 9.5* 9.3* 8.6* 7.1* 7.5*  HCT 27.9* 32.4* 30.8* 28.8* 24.6* 25.7*  MCV 89.4 89.5 90.6 90.0 93.5 91.5  PLT 201 261 230 198 166 148*   Cardiac Enzymes: No results for input(s): CKTOTAL, CKMB, CKMBINDEX, TROPONINI in the last 168 hours. CBG: Recent Labs  Lab 02/20/18 1138 02/20/18 1653 02/20/18 1945 02/21/18 0420 02/21/18 0824  GLUCAP 83 101* 94  115* 17*    Iron Studies: No results for input(s): IRON, TIBC, TRANSFERRIN, FERRITIN in the last 72 hours. Studies/Results: Dg Chest Port 1 View  Result Date: 02/21/2018 CLINICAL DATA:  Respiratory failure. EXAM: PORTABLE CHEST 1 VIEW COMPARISON:  02/20/2018 FINDINGS: Endotracheal tube terminates 2.5 cm above the carina. Enteric tube terminates over the gastric fundus. Right jugular catheter terminates over the mid SVC. The cardiac silhouette is normal in size. Heterogeneous opacities in both lung bases are unchanged. There may be small bilateral pleural effusions. No pneumothorax is identified. IMPRESSION: Unchanged bibasilar pneumonia or atelectasis. Electronically Signed   By: Logan Bores M.D.   On: 02/21/2018 09:15   Dg Chest Port 1 View  Result Date: 02/20/2018 CLINICAL DATA:  Status post central line placement. EXAM: PORTABLE CHEST 1 VIEW COMPARISON:  Portable chest x-ray of earlier today. FINDINGS: There has been interval placement of right internal jugular venous catheter whose tip projects over the junction of the proximal and middle thirds of the SVC. There is no postprocedure pneumothorax or hemothorax. The endotracheal and esophagogastric tubes appears stable. The lungs are reasonably well inflated. There is bibasilar atelectasis or pneumonia which is stable. The heart and pulmonary vascularity  are normal. IMPRESSION: No postprocedure complication following right internal jugular venous catheter placement. Electronically Signed   By: David  Martinique M.D.   On: 02/20/2018 10:41   Dg Chest Port 1 View  Result Date: 02/20/2018 CLINICAL DATA:  Acute respiratory failure EXAM: PORTABLE CHEST 1 VIEW COMPARISON:  02/18/2018 FINDINGS: Endotracheal tube 3.4 cm above the carina. NG tube within the proximal stomach. Monitor leads overlie the chest. Improving left lower lobe partial collapse/consolidation. Chronic appearing bibasilar fibrotic changes. No enlarging effusion or significant pneumothorax. Aorta atherosclerotic. IMPRESSION: Improving left basilar partial collapse/consolidation Chronic appearing bibasilar interstitial fibrotic pattern and persistent low lung volumes Stable support apparatus Electronically Signed   By: Jerilynn Mages.  Shick M.D.   On: 02/20/2018 09:39   . chlorhexidine gluconate (MEDLINE KIT)  15 mL Mouth Rinse BID  . Chlorhexidine Gluconate Cloth  6 each Topical Daily  . Chlorhexidine Gluconate Cloth  6 each Topical Q0600  . dexamethasone  4 mg Intravenous Q12H  . feeding supplement (PRO-STAT SUGAR FREE 64)  30 mL Per Tube BID  . fentaNYL (SUBLIMAZE) injection  50 mcg Intravenous Once  . free water  30 mL Per Tube Q4H  . insulin aspart  0-9 Units Subcutaneous Q4H  . mouth rinse  15 mL Mouth Rinse 10 times per day  . multivitamin  15 mL Per Tube Daily  . sodium chloride flush  3 mL Intravenous Q12H    BMET    Component Value Date/Time   NA 139 02/21/2018 0504   NA 138 12/20/2013   K 4.6 02/21/2018 0504   CL 105 02/21/2018 0504   CO2 24 02/21/2018 0504   GLUCOSE 102 (H) 02/21/2018 0504   BUN 60 (H) 02/21/2018 0504   BUN 20 12/20/2013   CREATININE 3.82 (H) 02/21/2018 0504   CREATININE 0.90 11/18/2016 1350   CALCIUM 7.8 (L) 02/21/2018 0504   GFRNONAA 11 (L) 02/21/2018 0504   GFRNONAA 67 11/18/2016 1350   GFRAA 13 (L) 02/21/2018 0504   GFRAA 78 11/18/2016 1350   CBC     Component Value Date/Time   WBC 10.7 (H) 02/21/2018 0504   RBC 2.81 (L) 02/21/2018 0504   HGB 7.5 (L) 02/21/2018 0504   HCT 25.7 (L) 02/21/2018 0504   PLT 148 (L) 02/21/2018 0504   MCV 91.5  02/21/2018 0504   MCH 26.7 02/21/2018 0504   MCHC 29.2 (L) 02/21/2018 0504   RDW 16.4 (H) 02/21/2018 0504   LYMPHSABS 0.4 (L) 02/21/2018 0504   MONOABS 0.3 02/21/2018 0504   EOSABS 0.0 02/21/2018 0504   BASOSABS 0.0 02/21/2018 0504     Assessment/Plan:  1. VDRF due to acute onset angioedema- unclear etiology. She did report trying viscous lidocaine with "gagging and throat closing" 02/17/18 and unclear if she drank more afterwards. Appreciate PCCM assistance with her care.  2. AKI/CKD in setting of splenic infarct, IV contrast and volume depletion with concomitant ACE-inhibitionand now with VDRF and angioedema. Cr wasslowly improving, however given N/V and acute angioedema, Cr has worsened. Unable to perform renal biopsy due to need for anticoagulation and the development of ischemic bowel off of heparin. Continue to hold lisinopril and follow renal function. 1. 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 2. Now with progressive oliguric AKI with hyperkalemia.  Initiated CVVHD on 02/20/18 and will cont to follow for renal recovery. 3. She did not want to change imuran to cellcept prior to her angioedema but I did discuss this with Dr. Trudie Reed who was in favor of the change (the Imuran was for her pulmonary fibrosis).  4. Renal biopsy still on hold due to her ischemic bowel off of heparin gttand now with VDRF.Hopefully we can perform one early next week if she stabilizes. 5. 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  Would hold off on cytoxan or cellcept for now given her angioedema of unclear etiology.   3. Abdominal pain with N/V- w/uwith CT revealed possible ischemic enteritis of jejunum and GI has been consulted for further evaluation and management. She is back on heparin and pain is intermittent but somewhat improved. 4. Metabolic acidosis- due to #1 improved with CVVHD and bicarb.  Ok to stop bicarb drip now that she is on CVVHD. 5. Splenic infarction, acute- is on IV heparin but was put on hold for biopsy, cont to hold for now.Hypercoagulable workup negative thus far. 6. Proteinuria- as above, hold off on bx for now. 7. HTN- will start bp meds and follow (lisinopril on hold due to AKI/CKD). Will add amlodipine and may need clonidine prn.  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 Idiopathic Pulmonary Fibrosis- on imuran  Donetta Potts, MD [facility] [phone]

## 2018-02-21 NOTE — Progress Notes (Addendum)
NAME:  Madeline Harper, MRN:  [id number], DOB:  1950/12/26, LOS: 12 ADMISSION DATE:  02/26/2018, CONSULTATION DATE:  02/18/2018 REFERRING MD:  Dr. 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  Additionally found to have ischemic bowel and placed on heparin gtt.  Developed acute angioedema 11/13 requiring emergent intubation (? Related to magic mouth wash).   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  11/14 Tongue swelling improved. UOP dropping, acid base worse. Cr increased.   Consults:  11/5 ID  11/5 cardiology 11/8 Nephrology 11/12 GI 11/13 PCCM   Procedures:  11/5 TEE >> LVEF 55-60%, no evidence of IE, could not rule out small PFO 11/13 ETT >>  Significant Diagnostic Tests:  11/3 CT abd/pelvis >> 1. Large subcapsular splenic infarct. Findings may be related to an embolic process possibly originating from the heart such as infective endocarditis. Clinical correlation is recommended. 2. Focal area of wall apparent thickening or thrombus in the midportion of the splenic artery with associated luminal narrowing.  3. Mildly enlarged retroperitoneal and right common iliac lymph nodes. 4. Sigmoid diverticulosis. No bowel obstruction or active inflammation. Normal appendix. 5. Possible small left ovarian dermoid.  11/5 TEE >> LVEF 55-60%, no evidence of IE, could not rule out small PFO  11/11 CT abd/pelvis >>1. Markedly abnormal loop of proximal jejunum extending into the mid jejunum with an edematous appearance. The wall is markedly thickened. Findings c/w indeterminate enteritis which could be ischemic, infectious, or inflammatory.  2. New left effusion. 3.  The opacity underlying the new left effusion could represent atelectasis or pneumonia. 4. Gallbladder is distended but stable.  5. Atherosclerosis in the nonaneurysmal aorta. 6. Suspected splenic infarcts again noted.  CT head/neck 11/13 >> 1. Diffuse edema of the pharynx and larynx, consistent with angioedema.2. Enlarged left parotid and submandibular glands with surrounding edema and thickening of the platysma muscle. This is of uncertain etiology, but may be part of the same process causing the diffuse airway edema.  Micro Data:  11/3 BC x 3 >> negative 11/13 MRSA PCR >>  Antimicrobials:    Interim History / Subjective:  RN reports peripheral hypoglycemia on CBG's but rechecked on HD machine with normoglycemia.  BP soft (SBP high 80's-low 90's).  On CVVHD > goal to pull neg 61ml/hr.    Objective   Blood pressure 92/61, pulse 89, temperature (!) 97.4 F (36.3 C), temperature source Oral, resp. rate 18, height 5' (1.524 m), weight 54.9 kg, SpO2 100 %.    Vent Mode: PRVC FiO2 (%):  [30 %] 30 % Set Rate:  [18 bmp-20 bmp] 18 bmp Vt Set:  [360 mL] 360 mL PEEP:  [5 cmH20] 5 cmH20 Plateau Pressure:  [17 cmH20-25 cmH20] 21 cmH20   Intake/Output Summary (Last 24 hours) at 02/21/2018 1032 Last data filed at 02/21/2018 1000 Gross per 24 hour  Intake 2521.38 ml  Output 2718 ml  Net -196.62 ml   Filed Weights   2018/02/26 0350 02/17/18 0523 02/20/18 0500  Weight: 59.9 kg 58.2 kg 54.9 kg    Examination: General: critically ill appearing female lying in bed in NAD HEENT: MM pink/moist, R IJ HD cath, ETT, tongue protruding from  mouth but improved overall Neuro: sedate, no distress   CV: s1s2 rrr, no m/r/g PULM: even/non-labored, lungs bilaterally coarse  ZO:XWRUGI:soft, non-tender, bsx4 active  Extremities: warm/dry, trace to 1+ edema  Skin: no rashes or lesions  Resolved Hospital Problem list     Assessment & Plan:   Angioedema / Emergent Airway Obstruction  -acute onset  tongue/neck swelling 11/13.  Sore throat began 11/12 (was also started on heparin at that time due to splenic infarct, magic mouth wash) -etiology unclear  P: Decadron 4mg  Q12 Pepcid BID  Supportive care  Acute Respiratory Insufficiency in setting of Airway Obstruction  P: PRVC 8 cc/kg  Wean PEEP / FiO2 for sats > 90% No weaning given ongoing tongue swelling   Splenic Infarct  -concern for possible SLE / mixed connective tissue process -?small PFO on TTE P: Continue heparin gtt  GI following, appreciate input   Soft Normal BP  P: Monitor, may require vasopressors (could use HD cath) Place aline  Abdominal Pain with Nausea / Vomiting  -CT concerning possible ischemic enteritis of the jejunum P: IV heparin  Hematology consulted appreciated   AKI on CKD -recent IV contrast, volume depletion, ACE-I use -renal fxn worse.  P: Trend BMP / urinary output Replace electrolytes as indicated Avoid nephrotoxic agents, ensure adequate renal perfusion  Fluid and electrolyte imbalance: NAGMA, hyperchloremia  P: HD per Nephrology   Iron Deficiency Anemia P: Trend CBC Transfuse per ICU guidelines   Fever  P: Follow Monitor off abx  Mixed Connective Tissue Disease / Possible SLE -followed by Dr. Lendon ColonelHawks -on Plaquenil & Imuran at baseline -negative antiphospholipid, lupus anticoagulant, beta-2 glycoprotein, LDH, uric acid, protein C/S, blood cultures x2 negative  P: Hold plaquenil, imuran  HTN P: Hold ACE-I Hold norvasc  Best practice:  Diet: NPO Pain/Anxiety/Delirium protocol (if indicated): Propofol, fentanyl gtt's VAP protocol (if indicated): in place  DVT prophylaxis: Heparin gtt  GI prophylaxis: Pepcid  Glucose control: n/a, monitor while on steroids  Mobility: bedrest  Code Status: full code  Family Communication:  Husband and her daughter updated at bedside 11/16 am on NP rounds Disposition: ICU    CC Time: 30 minutes  Canary BrimBrandi Ollis, NP-C   Pulmonary & Critical Care Pgr: [phone] or if no answer [phone] 02/21/2018, 10:33 AM  Attending Note:  67 year old female with PMH of UIP who presents to PCCM with angioedema induced respiratory failure that was an emergent intubation on 11/14.  On exam, tongue remains enlarged with clear lungs.  I reviewed CXR myself, ETT is in a good.  Will hold weaning efforts.  Will continue steroids, H2 and H1 blockage.  Diureses as ordered.  PCCM will continue to monitor for now.  The patient is critically ill with multiple organ systems failure and requires high complexity decision making for assessment and support, frequent evaluation and titration of therapies, application of advanced monitoring technologies and extensive interpretation of multiple databases.   Critical Care Time devoted to patient care services described in this note is  31  Minutes. This time reflects time of care of this signee Dr Koren BoundWesam Yacoub. This critical care time does not reflect procedure time, or teaching time or supervisory time of PA/NP/Med student/Med Resident etc but could involve care discussion time.  Alyson ReedyWesam G. Yacoub, M.D. [facility]. Pager: [phone]. After hours pager: [phone].

## 2018-02-21 NOTE — Progress Notes (Signed)
Art line attempt unsuccessful.  4 attempts by 2 RTs using doppler.  Small amount of flash noted in needle but unable to successfully thread catheter into artery.

## 2018-02-21 NOTE — Procedures (Signed)
Arterial Catheter Insertion Procedure Note Madeline Harper [id number] 1950-10-14  Procedure: Insertion of Arterial Catheter  Indications: Blood pressure monitoring and Frequent blood sampling  Procedure Details Consent: Risks of procedure as well as the alternatives and risks of each were explained to the (patient/caregiver).  Consent for procedure obtained. and Unable to obtain consent because of emergent medical necessity. Time Out: Verified patient identification, verified procedure, site/side was marked, verified correct patient position, special equipment/implants available, medications/allergies/relevent history reviewed, required imaging and test results available.  Performed  Maximum sterile technique was used including antiseptics, cap, gloves, gown, hand hygiene and mask. Skin prep: Chlorhexidine; local anesthetic administered 20 gauge catheter was inserted into left radial artery using the Seldinger technique. ULTRASOUND GUIDANCE USED: YES Evaluation Blood flow good; BP tracing good. Complications: No apparent complications.   Madeline Harper, Madeline Harper 02/21/2018

## 2018-02-21 NOTE — Progress Notes (Signed)
CBG's from capillary resulted as 17. Reported to CCM. Drew sample from blue port resulting at 102. Will continue to draw levels from port.

## 2018-02-22 ENCOUNTER — Inpatient Hospital Stay (HOSPITAL_COMMUNITY): Payer: Medicare Other

## 2018-02-22 LAB — GLUCOSE, CAPILLARY
GLUCOSE-CAPILLARY: 102 mg/dL — AB (ref 70–99)
GLUCOSE-CAPILLARY: 115 mg/dL — AB (ref 70–99)
GLUCOSE-CAPILLARY: 97 mg/dL (ref 70–99)
Glucose-Capillary: 112 mg/dL — ABNORMAL HIGH (ref 70–99)

## 2018-02-22 LAB — RENAL FUNCTION PANEL
Albumin: 1.6 g/dL — ABNORMAL LOW (ref 3.5–5.0)
Anion gap: 8 (ref 5–15)
BUN: 36 mg/dL — ABNORMAL HIGH (ref 8–23)
CHLORIDE: 101 mmol/L (ref 98–111)
CO2: 24 mmol/L (ref 22–32)
CREATININE: 2.3 mg/dL — AB (ref 0.44–1.00)
Calcium: 7.9 mg/dL — ABNORMAL LOW (ref 8.9–10.3)
GFR calc non Af Amer: 21 mL/min — ABNORMAL LOW (ref >60)
GFR, EST AFRICAN AMERICAN: 24 mL/min — AB (ref >60)
Glucose, Bld: 140 mg/dL — ABNORMAL HIGH (ref 70–99)
Phosphorus: 3.6 mg/dL (ref 2.5–4.6)
Potassium: 4.7 mmol/L (ref 3.5–5.1)
Sodium: 133 mmol/L — ABNORMAL LOW (ref 135–145)

## 2018-02-22 LAB — POCT I-STAT 3, ART BLOOD GAS (G3+)
Acid-Base Excess: 1 mmol/L (ref 0.0–2.0)
BICARBONATE: 26.4 mmol/L (ref 20.0–28.0)
O2 Saturation: 99 %
PCO2 ART: 44.2 mmHg (ref 32.0–48.0)
PH ART: 7.38 (ref 7.350–7.450)
PO2 ART: 122 mmHg — AB (ref 83.0–108.0)
Patient temperature: 96.7
TCO2: 28 mmol/L (ref 22–32)

## 2018-02-22 LAB — CBC
HCT: 27 % — ABNORMAL LOW (ref 36.0–46.0)
Hemoglobin: 7.9 g/dL — ABNORMAL LOW (ref 12.0–15.0)
MCH: 26.6 pg (ref 26.0–34.0)
MCHC: 29.3 g/dL — AB (ref 30.0–36.0)
MCV: 90.9 fL (ref 80.0–100.0)
PLATELETS: 116 10*3/uL — AB (ref 150–400)
RBC: 2.97 MIL/uL — AB (ref 3.87–5.11)
RDW: 16.5 % — AB (ref 11.5–15.5)
WBC: 10.1 10*3/uL (ref 4.0–10.5)
nRBC: 1.9 % — ABNORMAL HIGH (ref 0.0–0.2)

## 2018-02-22 LAB — MAGNESIUM: Magnesium: 2.3 mg/dL (ref 1.7–2.4)

## 2018-02-22 LAB — HEPARIN LEVEL (UNFRACTIONATED): HEPARIN UNFRACTIONATED: 0.33 [IU]/mL (ref 0.30–0.70)

## 2018-02-22 LAB — PHOSPHORUS: Phosphorus: 3.6 mg/dL (ref 2.5–4.6)

## 2018-02-22 NOTE — Progress Notes (Addendum)
NAME:  Madeline KinsmanDiane Tinkham, MRN:  [id number], DOB:  02/14/1951, LOS: 13 ADMISSION DATE:  02/18/2018, CONSULTATION DATE:  02/18/2018 REFERRING MD:  Dr. 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  Additionally found to have ischemic bowel and placed on heparin gtt.  Developed acute angioedema 11/13 requiring emergent intubation (? Related to magic mouth wash).   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  11/14 Tongue swelling improved. UOP dropping, acid base worse. Cr increased.   Consults:  11/5 ID  11/5 cardiology 11/8 Nephrology 11/12 GI 11/13 PCCM   Procedures:  11/5 TEE >> LVEF 55-60%, no evidence of IE, could not rule out small PFO 11/13 ETT >>  Significant Diagnostic Tests:  11/3 CT abd/pelvis >> 1. Large subcapsular splenic infarct. Findings may be related to an embolic process possibly originating from the heart such as infective endocarditis. Clinical correlation is recommended. 2. Focal area of wall apparent thickening or thrombus in the midportion of the splenic artery with associated luminal narrowing.  3. Mildly enlarged retroperitoneal and right common iliac lymph nodes. 4. Sigmoid diverticulosis. No bowel obstruction or active inflammation. Normal appendix. 5. Possible small left ovarian dermoid.  11/5 TEE >> LVEF 55-60%, no evidence of IE, could not rule out small PFO  11/11 CT abd/pelvis >>1. Markedly abnormal loop of proximal jejunum extending into the mid jejunum with an edematous appearance. The wall is markedly thickened. Findings c/w indeterminate enteritis which could be ischemic, infectious, or inflammatory.  2. New left effusion. 3.  The opacity underlying the new left effusion could represent atelectasis or pneumonia. 4. Gallbladder is distended but stable.  5. Atherosclerosis in the nonaneurysmal aorta. 6. Suspected splenic infarcts again noted.  CT head/neck 11/13 >> 1. Diffuse edema of the pharynx and larynx, consistent with angioedema.2. Enlarged left parotid and submandibular glands with surrounding edema and thickening of the platysma muscle. This is of uncertain etiology, but may be part of the same process causing the diffuse airway edema.  Micro Data:  11/3 BC x 3 >> negative 11/13 MRSA PCR >>  Antimicrobials:    Interim History / Subjective:  Aline placed overnight.  No acute events.  Remains on CVVHD - neg56200ml in last 24 hours, remains positive 5.3L for admit   Objective   Blood pressure (!) 125/59, pulse 86, temperature 97.7 F (36.5 C), temperature source Oral, resp. rate 18, height 5' (1.524 m), weight 54.9 kg, SpO2 99 %.    Vent Mode: PRVC FiO2 (%):  [30 %] 30 % Set Rate:  [18 bmp] 18 bmp Vt Set:  [360 mL] 360 mL PEEP:  [5 cmH20] 5 cmH20 Plateau Pressure:  [18 cmH20-25 cmH20] 22 cmH20   Intake/Output Summary (Last 24 hours) at 02/22/2018 0919 Last data filed at 02/22/2018 0800 Gross per 24 hour  Intake 2808.52 ml  Output 3416 ml  Net -607.48 ml   Filed Weights   02/27/2018 0350 02/17/18 0523 02/20/18 0500  Weight: 59.9 kg 58.2 kg 54.9 kg    Examination: General: adult female lying in bed, critically ill appearing HEENT: MM pink/moist, ETT, tongue swelling improved Neuro: sedated CV: s1s2 rrr, no m/r/g PULM: even/non-labored, lungs bilaterally clear  WU:JWJXGI:soft,  non-tender, bsx4 active  Extremities: warm/dry, no edema  Skin: no rashes or lesions  Resolved Hospital Problem list     Assessment & Plan:   Angioedema / Emergent Airway Obstruction  -acute onset tongue/neck swelling 11/13.  Sore throat began 11/12 (was also started on heparin at that time due to splenic infarct, magic  mouth wash) -etiology unclear  P: Continue decadron 4mg  Q12  Pepcid BID  Supportive care > much improved, may be able to lighten sedation some 11/18  Acute Respiratory Insufficiency in setting of Airway Obstruction  P: PRVC 8cc/kg  No wean until tongue swelling resolved Follow intermittent CXR   Splenic Infarct  -concern for possible SLE / mixed connective tissue process -?small PFO on TTE P: Heparin gtt  Appreciate GI input    Hypotension  -sedation related P: Aline monitoring  Levophed for MAP >65  Abdominal Pain with Nausea / Vomiting  -CT concerning possible ischemic enteritis of the jejunum P: Continue heparin  Appreciate hematology input  PRN zofran   AKI on CKD -recent IV contrast, volume depletion, ACE-I use -renal fxn worse.  P: Nephrology following, appreciate input  CVVHD with goal of 0 to -25/hr Trend BMP / urinary output Replace electrolytes as indicated Avoid nephrotoxic agents, ensure adequate renal perfusion  Fluid and electrolyte imbalance: NAGMA, hyperchloremia  P: HD per Renal   Iron Deficiency Anemia P: Trend CBC  Transfuse per ICU guidelines   Fever  P: Monitor fever curve / WBC trend Follow off abx  Mixed Connective Tissue Disease / Possible SLE -followed by Dr. Lendon Colonel -on Plaquenil & Imuran at baseline -negative antiphospholipid, lupus anticoagulant, beta-2 glycoprotein, LDH, uric acid, protein C/S, blood cultures x2 negative  P: Hold plaquenil, imuran   HTN P: Hold home ACE-I, ? If she should go back on given angioedema  Hold norvasc  Best practice:  Diet: NPO Pain/Anxiety/Delirium protocol (if indicated): Propofol, fentanyl gtt's VAP protocol (if indicated): in place  DVT prophylaxis: Heparin gtt  GI prophylaxis: Pepcid  Glucose control: n/a, monitor while on steroids  Mobility: bedrest  Code Status: full code  Family Communication:  Daughter updated 11/17 am on NP rounds Disposition: ICU    CC Time:30  minutes  Canary Brim, NP-C [facility] Pgr: [phone] or if no answer [phone] 02/22/2018, 9:19 AM  Attending Note:  67 year old female with angioedema unsure of cause who presents to PCCM with respiratory failure.  Patient is improving from an angioedema standpoint and tolerating CRRT well.  On exam, lungs are clear with decrease size of the tongue.  I reviewed CXR myself, ETT is in a good position.  Continue volume negative.  Steroids/H1/H2 as ordered.  Hold off extubation today but continue weaning efforts.  PCCM will continue to follow.  The patient is critically ill with multiple organ systems failure and requires high complexity decision making for assessment and support, frequent evaluation and titration of therapies, application of advanced monitoring technologies and extensive interpretation of multiple databases.   Critical Care Time devoted to patient care services described in this note is  34  Minutes. This time reflects time of care of this signee Dr Koren Bound. This critical care time does not reflect procedure time, or teaching time or supervisory time of PA/NP/Med student/Med Resident etc but could involve care discussion time.  Alyson Reedy, M.D. [facility] Pulmonary/Critical Care Medicine. Pager: [phone]. After hours pager: [phone].

## 2018-02-22 NOTE — Progress Notes (Signed)
ANTICOAGULATION CONSULT NOTE - Follow Up Consult  Pharmacy Consult for Heparin Indication: Splenic infarct  Patient Measurements: Height: 5' (152.4 cm) Weight: 121 lb 0.5 oz (54.9 kg) IBW/kg (Calculated) : 45.5 Heparin Dosing Weight: 55.8 kg  Vital Signs: Temp: 97.7 F (36.5 C) (11/17 0805) Temp Source: Oral (11/17 0805) BP: 125/59 (11/17 0805) Pulse Rate: 86 (11/17 0805)  Labs: Recent Labs    02/20/18 0240  02/21/18 0504 02/21/18 1809 02/21/18 2330 02/22/18 0322  HGB  --    < > 7.5*  --  8.8* 7.9*  HCT  --   --  25.7*  --  26.0* 27.0*  PLT  --   --  148*  --   --  116*  HEPARINUNFRC 0.39  --  0.56  --   --  0.33  CREATININE 4.34*   < > 3.82* 1.80* 2.50* 2.30*   < > = values in this interval not displayed.    Estimated Creatinine Clearance: 18.7 mL/min (A) (by C-G formula based on SCr of 2.3 mg/dL (H)).   Medications:  Infusions:  .  prismasol BGK 4/2.5 500 mL/hr at 02/21/18 1434  .  prismasol BGK 4/2.5 300 mL (02/21/18 2336)  . sodium chloride Stopped (02/20/18 0908)  . famotidine (PEPCID) IV Stopped (02/21/18 1006)  . feeding supplement (VITAL AF 1.2 CAL) 30 mL/hr at 02/22/18 0800  . fentaNYL infusion INTRAVENOUS 225 mcg/hr (02/22/18 0800)  . heparin 900 Units/hr (02/22/18 0800)  . heparin 999 mL/hr at 02/21/18 1435  . norepinephrine (LEVOPHED) Adult infusion 9 mcg/min (02/22/18 0800)  . prismasol BGK 4/2.5 1,000 mL/hr (02/22/18 [id number])  . propofol (DIPRIVAN) infusion 50 mcg/kg/min (02/22/18 0800)    Assessment: [age] YOF who presented on 11/3 with L-flank pain and was found to have a splenic infarct and now with imaging showing ischemic colitis - currently evaluating for source. Pharmacy consulted to dose Heparin for anticoagulation.  Emergently intubated 11/13 due to angioedema and heparin infusion was stopped upon transfer to 2H. CT neck showing angioedema but no indications of hematoma. Heparin infusion okay to resume per CCM. Heparin level 0.3 remains  therapeutic this morning on heparin drip 900 uts/hr. H/H drift down will monitor, platelets within normal limits, and no overt bleeding noted. No issues with infusion overnight per RN.   Goal of Therapy:  Heparin level 0.3-0.7 units/ml Monitor platelets by anticoagulation protocol: Yes   Plan:  Continue heparin infusion at 900 units/hr Daily heparin level and CBC while on heparin Monitor s/sx bleeding     Leota SauersLisa Brylan Seubert Pharm.D. CPP, BCPS Clinical Pharmacist [phone] 02/22/2018 9:26 AM

## 2018-02-22 NOTE — Progress Notes (Signed)
S:No events overnight, angioedema continues to improve O:BP (!) 125/59   Pulse 86   Temp 97.7 F (36.5 C) (Oral)   Resp 18   Ht 5' (1.524 m)   Wt 54.9 kg   SpO2 99%   BMI 23.64 kg/m   Intake/Output Summary (Last 24 hours) at 02/22/2018 1009 Last data filed at 02/22/2018 1000 Gross per 24 hour  Intake 2709.31 ml  Output 3421 ml  Net -711.69 ml   Intake/Output: I/O last 3 completed shifts: In: 4294.9 [P.O.:60; I.V.:2864.9; NG/GT:1320; IV Piggyback:49.9] Out: 8828 [Other:5160]  Intake/Output this shift:  Total I/O In: 150 [NG/GT:150] Out: 398 [Other:398] Weight change:  Gen: intubated/sedated CVS: no rub Resp: occ rhonchi Abd: +BS, soft, NT/ND Ext: no edema  Recent Labs  Lab 02/16/18 0431  02/18/18 0536 02/19/18 0259 02/20/18 0240 02/20/18 1740 02/20/18 1741 02/21/18 0504 02/21/18 1809 02/21/18 2330 02/22/18 0322  NA 139   < > 139 140 141  --  142 139 133* 134* 133*  K 3.6   < > 3.9 4.6 5.1  --  4.2 4.6 4.3 4.5 4.7  CL 111   < > 114* 116* 114*  --  111 105 101 103 101  CO2 21*   < > 20* 16* 17*  --  22 24 24   --  24  GLUCOSE 131*   < > 123* 169* 111*  --  108* 102* 127* 148* 140*  BUN 17   < > 26* 42* 69*  --  78* 60* 31* 35* 36*  CREATININE 1.57*   < > 1.89* 2.73* 4.34*  --  4.72* 3.82* 1.80* 2.50* 2.30*  ALBUMIN 1.8*  1.8*   < > 1.7* 1.3* 1.6*  --  1.4* 1.7* 1.9*  --  1.6*  CALCIUM 8.4*   < > 8.3* 7.7* 7.8*  --  7.4* 7.8* 7.8*  --  7.9*  PHOS 4.5   < >  --  7.0* 8.8* 8.3* 8.4* 6.1* 3.6  3.4  --  3.6  3.6  AST 57*  --  25  --   --   --   --   --   --   --   --   ALT 48*  --  23  --   --   --   --   --   --   --   --    < > = values in this interval not displayed.   Liver Function Tests: Recent Labs  Lab 02/16/18 0431  02/18/18 0536  02/21/18 0504 02/21/18 1809 02/22/18 0322  AST 57*  --  25  --   --   --   --   ALT 48*  --  23  --   --   --   --   ALKPHOS 78  --  68  --   --   --   --   BILITOT 0.4  --  0.4  --   --   --   --   PROT 7.7  --   6.9  --   --   --   --   ALBUMIN 1.8*  1.8*   < > 1.7*   < > 1.7* 1.9* 1.6*   < > = values in this interval not displayed.   Recent Labs  Lab 02/16/18 0431  LIPASE 27   No results for input(s): AMMONIA in the last 168 hours. CBC: Recent Labs  Lab 02/17/18 0603 02/18/18 0536 02/19/18 0259 02/21/18  4765 02/21/18 2330 02/22/18 0322  WBC 7.9 10.6* 10.6* 10.7*  --  10.1  NEUTROABS 6.5  --   --  9.8*  --   --   HGB 9.3* 8.6* 7.1* 7.5* 8.8* 7.9*  HCT 30.8* 28.8* 24.6* 25.7* 26.0* 27.0*  MCV 90.6 90.0 93.5 91.5  --  90.9  PLT 230 198 166 148*  --  116*   Cardiac Enzymes: No results for input(s): CKTOTAL, CKMB, CKMBINDEX, TROPONINI in the last 168 hours. CBG: Recent Labs  Lab 02/21/18 1200 02/21/18 1609 02/21/18 1952 02/21/18 2327 02/22/18 0727  GLUCAP 98 133* 115* 131* 102*    Iron Studies: No results for input(s): IRON, TIBC, TRANSFERRIN, FERRITIN in the last 72 hours. Studies/Results: Dg Chest Port 1 View  Result Date: 02/22/2018 CLINICAL DATA:  Acute respiratory failure with hypoxia EXAM: PORTABLE CHEST 1 VIEW COMPARISON:  02/21/2018 FINDINGS: Endotracheal tube terminates 1.2 cm above the carina. Mild patchy opacity in the lateral right upper lobe, new, pneumonia not excluded. Mild patchy bibasilar opacities, likely atelectasis. No pleural effusion or pneumothorax. The heart is normal in size. Right IJ venous catheter terminates in the lower SVC. Enteric tube terminates in the gastric cardia. IMPRESSION: Endotracheal tube terminates 1.2 cm above the carina. Mild patchy opacity in the lateral right upper lobe, new, pneumonia not excluded. Mild bibasilar opacities, likely atelectasis. Additional support apparatus as above. Electronically Signed   By: Julian Hy M.D.   On: 02/22/2018 08:42   Dg Chest Port 1 View  Result Date: 02/21/2018 CLINICAL DATA:  Respiratory failure. EXAM: PORTABLE CHEST 1 VIEW COMPARISON:  02/20/2018 FINDINGS: Endotracheal tube terminates 2.5  cm above the carina. Enteric tube terminates over the gastric fundus. Right jugular catheter terminates over the mid SVC. The cardiac silhouette is normal in size. Heterogeneous opacities in both lung bases are unchanged. There may be small bilateral pleural effusions. No pneumothorax is identified. IMPRESSION: Unchanged bibasilar pneumonia or atelectasis. Electronically Signed   By: Logan Bores M.D.   On: 02/21/2018 09:15   Dg Chest Port 1 View  Result Date: 02/20/2018 CLINICAL DATA:  Status post central line placement. EXAM: PORTABLE CHEST 1 VIEW COMPARISON:  Portable chest x-ray of earlier today. FINDINGS: There has been interval placement of right internal jugular venous catheter whose tip projects over the junction of the proximal and middle thirds of the SVC. There is no postprocedure pneumothorax or hemothorax. The endotracheal and esophagogastric tubes appears stable. The lungs are reasonably well inflated. There is bibasilar atelectasis or pneumonia which is stable. The heart and pulmonary vascularity are normal. IMPRESSION: No postprocedure complication following right internal jugular venous catheter placement. Electronically Signed   By: David  Martinique M.D.   On: 02/20/2018 10:41   . chlorhexidine gluconate (MEDLINE KIT)  15 mL Mouth Rinse BID  . Chlorhexidine Gluconate Cloth  6 each Topical Daily  . Chlorhexidine Gluconate Cloth  6 each Topical Q0600  . dexamethasone  4 mg Intravenous Q12H  . feeding supplement (PRO-STAT SUGAR FREE 64)  30 mL Per Tube BID  . fentaNYL (SUBLIMAZE) injection  50 mcg Intravenous Once  . free water  30 mL Per Tube Q4H  . insulin aspart  0-9 Units Subcutaneous Q4H  . mouth rinse  15 mL Mouth Rinse 10 times per day  . multivitamin  15 mL Per Tube Daily  . sodium chloride flush  3 mL Intravenous Q12H    BMET    Component Value Date/Time   NA 133 (L) 02/22/2018 4650  NA 138 12/20/2013   K 4.7 02/22/2018 0322   CL 101 02/22/2018 0322   CO2 24  02/22/2018 0322   GLUCOSE 140 (H) 02/22/2018 0322   BUN 36 (H) 02/22/2018 0322   BUN 20 12/20/2013   CREATININE 2.30 (H) 02/22/2018 0322   CREATININE 0.90 11/18/2016 1350   CALCIUM 7.9 (L) 02/22/2018 0322   GFRNONAA 21 (L) 02/22/2018 0322   GFRNONAA 67 11/18/2016 1350   GFRAA 24 (L) 02/22/2018 0322   GFRAA 78 11/18/2016 1350   CBC    Component Value Date/Time   WBC 10.1 02/22/2018 0322   RBC 2.97 (L) 02/22/2018 0322   HGB 7.9 (L) 02/22/2018 0322   HCT 27.0 (L) 02/22/2018 0322   PLT 116 (L) 02/22/2018 0322   MCV 90.9 02/22/2018 0322   MCH 26.6 02/22/2018 0322   MCHC 29.3 (L) 02/22/2018 0322   RDW 16.5 (H) 02/22/2018 0322   LYMPHSABS 0.4 (L) 02/21/2018 0504   MONOABS 0.3 02/21/2018 0504   EOSABS 0.0 02/21/2018 0504   BASOSABS 0.0 02/21/2018 0504    Assessment/Plan:  1. VDRF due to acute onset angioedema- unclear etiology. She did report trying viscous lidocaine with "gagging and throat closing" 02/17/18 and unclear if she drank more afterwards. Appreciate PCCM assistance with her care.  Slowly improving and hopefully can be extubated in a few days. 2. AKI/CKD in setting of splenic infarct, IV contrast and volume depletion with concomitant ACE-inhibitionand now with VDRF and angioedema. Cr wasslowly improving, however given N/V and acute angioedema, Cr has worsened. Unable to perform renal biopsy due to need for anticoagulation and the development of ischemic bowel off of heparin. Continue to hold lisinopril and follow renal function. 1. 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   2. Now with progressive oliguric AKI with hyperkalemia. Initiated CVVHD on 02/20/18 and will cont to follow for renal recovery. 3. She did not want to change imuran to cellcept prior to her angioedema but I did discuss this with Dr. Trudie Reed who was in favor of the change (the Imuran was for her pulmonary fibrosis).   4. Renal biopsy still on hold due to her ischemic bowel off of heparin gttand now with VDRF.Hopefully we can perform one early next week if she stabilizes. 5. 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 Would hold off on cytoxan or cellcept for now given her angioedema of unclear etiology.  3. Abdominal pain with N/V- w/uwith CT revealed possible ischemic enteritis of jejunum and GI has been consulted for further evaluation and management. She is back on heparin. 4. Metabolic acidosis- due to #1 improved with CVVHD and bicarb.  Ok to stop bicarb drip now that she is on CVVHD. 5. Splenic infarction, acute- is on IV heparin but was put on hold for biopsy, cont to hold for now.Hypercoagulable workup negative thus far. 6. Proteinuria- as above, hold off on bx for now. 7. HTN- will start bp meds and follow (lisinopril on hold due to AKI/CKD). Will add amlodipine and may need clonidine prn.  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 Idiopathic Pulmonary Fibrosis- on imuran Donetta Potts, MD [facility] [phone]

## 2018-02-23 ENCOUNTER — Inpatient Hospital Stay (HOSPITAL_COMMUNITY): Payer: Medicare Other

## 2018-02-23 LAB — RENAL FUNCTION PANEL
ALBUMIN: 1.4 g/dL — AB (ref 3.5–5.0)
ANION GAP: 7 (ref 5–15)
Albumin: 1.6 g/dL — ABNORMAL LOW (ref 3.5–5.0)
Anion gap: 9 (ref 5–15)
BUN: 28 mg/dL — ABNORMAL HIGH (ref 8–23)
BUN: 26 mg/dL — AB (ref 8–23)
CALCIUM: 8.1 mg/dL — AB (ref 8.9–10.3)
CALCIUM: 7.7 mg/dL — AB (ref 8.9–10.3)
CO2: 23 mmol/L (ref 22–32)
CO2: 20 mmol/L — AB (ref 22–32)
Chloride: 100 mmol/L (ref 98–111)
Chloride: 102 mmol/L (ref 98–111)
Creatinine, Ser: 1.9 mg/dL — ABNORMAL HIGH (ref 0.44–1.00)
Creatinine, Ser: 1.95 mg/dL — ABNORMAL HIGH (ref 0.44–1.00)
GFR calc Af Amer: 31 mL/min — ABNORMAL LOW (ref >60)
GFR calc Af Amer: 30 mL/min — ABNORMAL LOW (ref >60)
GFR calc non Af Amer: 26 mL/min — ABNORMAL LOW (ref >60)
GFR calc non Af Amer: 26 mL/min — ABNORMAL LOW (ref >60)
GLUCOSE: 155 mg/dL — AB (ref 70–99)
GLUCOSE: 137 mg/dL — AB (ref 70–99)
PHOSPHORUS: 4 mg/dL (ref 2.5–4.6)
PHOSPHORUS: 3.8 mg/dL (ref 2.5–4.6)
POTASSIUM: 4.6 mmol/L (ref 3.5–5.1)
Potassium: 5 mmol/L (ref 3.5–5.1)
SODIUM: 130 mmol/L — AB (ref 135–145)
SODIUM: 131 mmol/L — AB (ref 135–145)

## 2018-02-23 LAB — GLUCOSE, CAPILLARY
GLUCOSE-CAPILLARY: 115 mg/dL — AB (ref 70–99)
GLUCOSE-CAPILLARY: 125 mg/dL — AB (ref 70–99)
GLUCOSE-CAPILLARY: 138 mg/dL — AB (ref 70–99)
Glucose-Capillary: 102 mg/dL — ABNORMAL HIGH (ref 70–99)
Glucose-Capillary: 113 mg/dL — ABNORMAL HIGH (ref 70–99)
Glucose-Capillary: 117 mg/dL — ABNORMAL HIGH (ref 70–99)
Glucose-Capillary: 128 mg/dL — ABNORMAL HIGH (ref 70–99)
Glucose-Capillary: 128 mg/dL — ABNORMAL HIGH (ref 70–99)
Glucose-Capillary: 142 mg/dL — ABNORMAL HIGH (ref 70–99)

## 2018-02-23 LAB — CBC
HCT: 27.8 % — ABNORMAL LOW (ref 36.0–46.0)
HEMOGLOBIN: 8 g/dL — AB (ref 12.0–15.0)
MCH: 26.5 pg (ref 26.0–34.0)
MCHC: 28.8 g/dL — AB (ref 30.0–36.0)
MCV: 92.1 fL (ref 80.0–100.0)
Platelets: 98 10*3/uL — ABNORMAL LOW (ref 150–400)
RBC: 3.02 MIL/uL — ABNORMAL LOW (ref 3.87–5.11)
RDW: 16.9 % — AB (ref 11.5–15.5)
WBC: 10.2 10*3/uL (ref 4.0–10.5)
nRBC: 5.1 % — ABNORMAL HIGH (ref 0.0–0.2)

## 2018-02-23 LAB — POCT I-STAT 3, ART BLOOD GAS (G3+)
Acid-base deficit: 1 mmol/L (ref 0.0–2.0)
Acid-base deficit: 13 mmol/L — ABNORMAL HIGH (ref 0.0–2.0)
Bicarbonate: 25.2 mmol/L (ref 20.0–28.0)
Bicarbonate: 15.2 mmol/L — ABNORMAL LOW (ref 20.0–28.0)
O2 Saturation: 99 %
O2 Saturation: 94 %
PH ART: 7.167 — AB (ref 7.350–7.450)
PO2 ART: 121 mmHg — AB (ref 83.0–108.0)
TCO2: 27 mmol/L (ref 22–32)
TCO2: 16 mmol/L — ABNORMAL LOW (ref 22–32)
pCO2 arterial: 47 mmHg (ref 32.0–48.0)
pCO2 arterial: 41.4 mmHg (ref 32.0–48.0)
pH, Arterial: 7.33 — ABNORMAL LOW (ref 7.350–7.450)
pO2, Arterial: 86 mmHg (ref 83.0–108.0)

## 2018-02-23 LAB — HEPARIN LEVEL (UNFRACTIONATED): HEPARIN UNFRACTIONATED: 0.41 [IU]/mL (ref 0.30–0.70)

## 2018-02-23 LAB — HEMOGLOBIN AND HEMATOCRIT, BLOOD
HCT: 28.3 % — ABNORMAL LOW (ref 36.0–46.0)
Hemoglobin: 8.1 g/dL — ABNORMAL LOW (ref 12.0–15.0)

## 2018-02-23 LAB — C1 ESTERASE INHIBITOR: C1 ESTERASE INH: 39 mg/dL (ref 21–39)

## 2018-02-23 LAB — MAGNESIUM: Magnesium: 2.5 mg/dL — ABNORMAL HIGH (ref 1.7–2.4)

## 2018-02-23 MED ORDER — SODIUM CHLORIDE 0.9 % IV BOLUS
500.0000 mL | Freq: Once | INTRAVENOUS | Status: AC
  Administered 2018-02-23: 500 mL via INTRAVENOUS

## 2018-02-23 MED ORDER — PHENYLEPHRINE HCL-NACL 10-0.9 MG/250ML-% IV SOLN
0.0000 ug/min | INTRAVENOUS | Status: DC
  Administered 2018-02-23: 150 ug/min via INTRAVENOUS
  Administered 2018-02-23: 13.333 ug/min via INTRAVENOUS
  Administered 2018-02-23: 20 ug/min via INTRAVENOUS
  Filled 2018-02-23 (×3): qty 250

## 2018-02-23 MED ORDER — VASOPRESSIN 20 UNIT/ML IV SOLN
0.0300 [IU]/min | INTRAVENOUS | Status: DC
  Administered 2018-02-23 – 2018-02-24 (×2): 0.03 [IU]/min via INTRAVENOUS
  Filled 2018-02-23 (×2): qty 2

## 2018-02-23 MED ORDER — STERILE WATER FOR INJECTION IV SOLN
INTRAVENOUS | Status: DC
  Administered 2018-02-23 – 2018-02-24 (×2): via INTRAVENOUS
  Filled 2018-02-23 (×5): qty 850

## 2018-02-23 MED ORDER — SODIUM CHLORIDE 0.9 % IV SOLN
0.0000 ug/min | INTRAVENOUS | Status: DC
  Administered 2018-02-23: 300 ug/min via INTRAVENOUS
  Administered 2018-02-23: 140 ug/min via INTRAVENOUS
  Filled 2018-02-23: qty 4
  Filled 2018-02-23 (×2): qty 40

## 2018-02-23 MED ORDER — NOREPINEPHRINE 16 MG/250ML-% IV SOLN
0.0000 ug/min | INTRAVENOUS | Status: DC
  Administered 2018-02-23: 16 ug/min via INTRAVENOUS
  Administered 2018-02-24 (×2): 60 ug/min via INTRAVENOUS
  Administered 2018-02-24: 45 ug/min via INTRAVENOUS
  Filled 2018-02-23 (×4): qty 250

## 2018-02-23 MED ORDER — SODIUM BICARBONATE 8.4 % IV SOLN
100.0000 meq | Freq: Once | INTRAVENOUS | Status: AC
  Administered 2018-02-23: 100 meq via INTRAVENOUS
  Filled 2018-02-23: qty 50

## 2018-02-23 MED ORDER — SODIUM CHLORIDE 0.9 % IV BOLUS
1000.0000 mL | Freq: Once | INTRAVENOUS | Status: AC
  Administered 2018-02-23: 1000 mL via INTRAVENOUS

## 2018-02-23 NOTE — Progress Notes (Signed)
Patient ID: Madeline Harper, female   DOB: [birth year], 67 y.o.   MRN: [id number] [facility] Progress Note   Assessment/ Plan:   1. Acute Kidney injury on chronic kidney disease stage III: Suspected to be multifactorial injury with IV contrast/volume depletion with ongoing RAS blockade and intrahospital development of volume depletion/acute angioedema  possibly resulting in ATN.  Concern raised with positive antinuclear antibody, hypocomplementemia and positive anti-double-stranded DNA.  Unfortunately, renal biopsy cannot be undertaken due to continued need for anticoagulation after she developed mesenteric ischemia off heparin.  Transitioned from azathioprine to mycophenolate (currently on hold) for empiric management of possible lupus nephritis as we await clinical stability to a point where we can get definitive tissue diagnosis with kidney biopsy.  She is currently on CRRT for clearance/volume management. 2.  Abdominal pain with nausea/vomiting: CT scan recently done shows evidence of ischemic enteritis of the jejunum for which she is back on anticoagulation with heparin.  She previously had presentation with splenic infarct. 3.  Hypertension: Blood pressures appear to be currently well controlled with ongoing CRRT and amlodipine. 4.  Angioedema/ventilator dependent respiratory failure: Volume status/labs improving with ongoing CRRT-we will continue current prescription  Subjective:   No acute events noted overnight-CRRT filter appears to be accumulating some thrombus.   Objective:   BP (!) 119/57   Pulse 86   Temp (!) 97.4 F (36.3 C) (Axillary)   Resp 18   Ht 5' (1.524 m)   Wt 54.9 kg   SpO2 100%   BMI 23.64 kg/m   Intake/Output Summary (Last 24 hours) at 02/23/2018 0817 Last data filed at 02/23/2018 0800 Gross per 24 hour  Intake 3262.67 ml  Output 3199 ml  Net 63.67 ml   Weight change:   Physical Exam: Gen: Intubated, resting comfortably CVS: Pulse regular  rhythm, normal rate, S1 and S2 normal Resp: Anteriorly clear to auscultation, no rales/rhonchi Abd: Soft, flat, nontender Ext: No lower extremity edema appreciated  Imaging: Dg Chest Port 1 View  Result Date: 02/22/2018 CLINICAL DATA:  Acute respiratory failure with hypoxia EXAM: PORTABLE CHEST 1 VIEW COMPARISON:  02/21/2018 FINDINGS: Endotracheal tube terminates 1.2 cm above the carina. Mild patchy opacity in the lateral right upper lobe, new, pneumonia not excluded. Mild patchy bibasilar opacities, likely atelectasis. No pleural effusion or pneumothorax. The heart is normal in size. Right IJ venous catheter terminates in the lower SVC. Enteric tube terminates in the gastric cardia. IMPRESSION: Endotracheal tube terminates 1.2 cm above the carina. Mild patchy opacity in the lateral right upper lobe, new, pneumonia not excluded. Mild bibasilar opacities, likely atelectasis. Additional support apparatus as above. Electronically Signed   By: Julian Hy M.D.   On: 02/22/2018 08:42    Labs: BMET Recent Labs  Lab 02/19/18 0259 02/20/18 0240 02/20/18 1740 02/20/18 1741 02/21/18 0504 02/21/18 1809 02/21/18 2330 02/22/18 0322 02/23/18 0405  NA 140 141  --  142 139 133* 134* 133* 130*  K 4.6 5.1  --  4.2 4.6 4.3 4.5 4.7 5.0  CL 116* 114*  --  111 105 101 103 101 100  CO2 16* 17*  --  22 24 24   --  24 23  GLUCOSE 169* 111*  --  108* 102* 127* 148* 140* 155*  BUN 42* 69*  --  78* 60* 31* 35* 36* 28*  CREATININE 2.73* 4.34*  --  4.72* 3.82* 1.80* 2.50* 2.30* 1.90*  CALCIUM 7.7* 7.8*  --  7.4* 7.8* 7.8*  --  7.9* 8.1*  PHOS 7.0* 8.8* 8.3* 8.4* 6.1* 3.6  3.4  --  3.6  3.6 4.0   CBC Recent Labs  Lab 02/17/18 0603  02/19/18 0259 02/21/18 0504 02/21/18 2330 02/22/18 0322 02/23/18 0405  WBC 7.9   < > 10.6* 10.7*  --  10.1 10.2  NEUTROABS 6.5  --   --  9.8*  --   --   --   HGB 9.3*   < > 7.1* 7.5* 8.8* 7.9* 8.0*  HCT 30.8*   < > 24.6* 25.7* 26.0* 27.0* 27.8*  MCV 90.6   < > 93.5  91.5  --  90.9 92.1  PLT 230   < > 166 148*  --  116* 98*   < > = values in this interval not displayed.    Medications:    . chlorhexidine gluconate (MEDLINE KIT)  15 mL Mouth Rinse BID  . Chlorhexidine Gluconate Cloth  6 each Topical Daily  . Chlorhexidine Gluconate Cloth  6 each Topical Q0600  . dexamethasone  4 mg Intravenous Q12H  . feeding supplement (PRO-STAT SUGAR FREE 64)  30 mL Per Tube BID  . fentaNYL (SUBLIMAZE) injection  50 mcg Intravenous Once  . free water  30 mL Per Tube Q4H  . insulin aspart  0-9 Units Subcutaneous Q4H  . mouth rinse  15 mL Mouth Rinse 10 times per day  . multivitamin  15 mL Per Tube Daily  . sodium chloride flush  3 mL Intravenous Q12H   Elmarie Shiley, MD 02/23/2018, 8:17 AM

## 2018-02-23 NOTE — Progress Notes (Addendum)
ANTICOAGULATION CONSULT NOTE - Follow Up Consult  Pharmacy Consult for Heparin Indication: Splenic infarct  Patient Measurements: Height: 5' (152.4 cm) Weight: 121 lb 0.5 oz (54.9 kg) IBW/kg (Calculated) : 45.5 Heparin Dosing Weight: 55.8 kg  Vital Signs: Temp: 97.4 F (36.3 C) (11/18 0700) Temp Source: Axillary (11/18 0700) Pulse Rate: 86 (11/18 0445)  Labs: Recent Labs    02/21/18 0504  02/21/18 2330 02/22/18 0322 02/23/18 0405  HGB 7.5*  --  8.8* 7.9* 8.0*  HCT 25.7*  --  26.0* 27.0* 27.8*  PLT 148*  --   --  116* 98*  HEPARINUNFRC 0.56  --   --  0.33 0.41  CREATININE 3.82*   < > 2.50* 2.30* 1.90*   < > = values in this interval not displayed.    Estimated Creatinine Clearance: 22.7 mL/min (A) (by C-G formula based on SCr of 1.9 mg/dL (H)).   Medications:  Infusions:  .  prismasol BGK 4/2.5 500 mL (02/23/18 0654)  .  prismasol BGK 4/2.5 300 mL/hr at 02/22/18 1615  . sodium chloride Stopped (02/20/18 0908)  . famotidine (PEPCID) IV Stopped (02/22/18 1000)  . feeding supplement (VITAL AF 1.2 CAL) 30 mL/hr at 02/23/18 0700  . fentaNYL infusion INTRAVENOUS 250 mcg/hr (02/23/18 0700)  . heparin 900 Units/hr (02/23/18 0700)  . heparin 999 mL/hr at 02/21/18 1435  . norepinephrine (LEVOPHED) Adult infusion 9 mcg/min (02/23/18 0700)  . prismasol BGK 4/2.5 1,000 mL/hr (02/23/18 0544)  . propofol (DIPRIVAN) infusion 50 mcg/kg/min (02/23/18 0700)    Assessment: Madeline Harper who presented on 11/3 with L-flank pain and was found to have a splenic infarct and now with imaging showing ischemic colitis - currently evaluating for source. Pharmacy consulted to dose Heparin for anticoagulation.   Of note, emergently intubated 11/13 due to angioedema and heparin infusion was stopped upon transfer to 2H. CT neck showing angioedema but no indications of hematoma. Heparin infusion okay to resume per CCM.  Heparin level remains therapeutic at 0.41, on 900 units/hr. Hgb 8, plt 98 - will  monitor trend closely. No s/sx of bleeding. No infusion issues.  Goal of Therapy:  Heparin level 0.3-0.7 units/ml Monitor platelets by anticoagulation protocol: Yes   Plan:  Continue heparin infusion at 900 units/hr Daily heparin level and CBC while on heparin Monitor s/sx bleeding  Sherron MondayKimberly Champion Corales, PharmD Clinical Pharmacist  Pager: [phone] Phone: [phone] 02/23/2018 7:20 AM

## 2018-02-23 NOTE — Progress Notes (Signed)
eLink Physician-Brief Progress Note Patient Name: Madeline KinsmanDiane Wint DOB: 30-Apr-1950 MRN: [id number]   Date of Service  02/23/2018  HPI/Events of Note  Hypotension - BP = 79/45 with MAP = 57. HR = 111. No CVP or CVL. LVEF = 55% to 60%. Currenlty on Norepinephrine and Phenylephrine IV infusions.  eICU Interventions  Will order: 1. Titrate Phenylephrine IV infusion up as already ordered.  2. Bolus with 0.9 NaCl 1 liter IV over 1 hour now.  3. ABG STAT.     Intervention Category Major Interventions: Hypotension - evaluation and management  Adore Kithcart Eugene 02/23/2018, 8:54 PM

## 2018-02-23 NOTE — Progress Notes (Signed)
eLink Physician-Brief Progress Note Patient Name: Madeline KinsmanDiane Mcfate DOB: 10-18-1950 MRN: [id number]   Date of Service  02/23/2018  HPI/Events of Note  ABG on 30%/PRVC 20/TV 360/P 5 = 7.167/41.4/86.0.  eICU Interventions  Will order: 1. NaHCO3 100 meq IV now.  2. NaHCO3 IV infusion to run IV at 75 mL/hour. 3. Repeat ABG at 1 AM.     Intervention Category Major Interventions: Acid-Base disturbance - evaluation and management;Respiratory failure - evaluation and management  Lenell AntuSommer,Steven Eugene 02/23/2018, 9:50 PM

## 2018-02-23 NOTE — Progress Notes (Signed)
NAME:  Madeline Harper, MRN:  [id number], DOB:  [birth year], LOS: 14 ADMISSION DATE:  03/05/2018, CONSULTATION DATE:  02/18/2018 REFERRING MD:  Dr. 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  Additionally found to have ischemic bowel and placed on heparin gtt.  Developed acute angioedema 11/13 requiring emergent intubation (? Related to magic mouth wash).   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  11/14 Tongue swelling improved. UOP dropping, acid base worse. Cr increased.   Consults:  11/5 ID  11/5 cardiology 11/8 Nephrology 11/12 GI 11/13 PCCM   Procedures:  11/5 TEE >> LVEF 55-60%, no evidence of IE, could not rule out small PFO 11/13 ETT >> 11/15 CRRT >   Significant Diagnostic Tests:  11/3 CT abd/pelvis >> 1. Large subcapsular splenic infarct. Findings may be related to an embolic process possibly originating from the heart such as infective endocarditis. Clinical correlation is recommended. 2. Focal area of wall apparent thickening or thrombus in the midportion of the splenic artery with associated luminal narrowing.  3. Mildly enlarged retroperitoneal and right common iliac lymph nodes. 4. Sigmoid diverticulosis. No bowel obstruction or active inflammation. Normal appendix. 5. Possible small left ovarian dermoid.  11/5 TEE >> LVEF 55-60%, no evidence of IE, could not rule out small PFO  11/11 CT abd/pelvis >>1. Markedly abnormal loop of proximal jejunum extending into the mid jejunum with an edematous appearance. The wall is markedly thickened. Findings c/w indeterminate enteritis which could be ischemic, infectious, or inflammatory.  2. New left  effusion. 3. The opacity underlying the new left effusion could represent atelectasis or pneumonia. 4. Gallbladder is distended but stable.  5. Atherosclerosis in the nonaneurysmal aorta. 6. Suspected splenic infarcts again noted.  CT head/neck 11/13 >> 1. Diffuse edema of the pharynx and larynx, consistent with angioedema.2. Enlarged left parotid and submandibular glands with surrounding edema and thickening of the platysma muscle. This is of uncertain etiology, but may be part of the same process causing the diffuse airway edema.  Micro Data:  11/3 BC x 3 >> negative 11/13 MRSA PCR >>  Antimicrobials:    Interim History / Subjective:  No acute events overnight. Angioedema greatly improved per family.   Objective   Blood pressure 130/66, pulse (!) 108, temperature (!) 97.4 F (36.3 C), temperature source Axillary, resp. rate (!) 22, height 5' (1.524 m), weight 54.9 kg, SpO2 100 %.    Vent Mode: PRVC FiO2 (%):  [30 %] 30 % Set Rate:  [18 bmp] 18 bmp Vt Set:  [360 mL] 360 mL PEEP:  [5 cmH20] 5 cmH20 Plateau Pressure:  [15 cmH20-21 cmH20] 16 cmH20   Intake/Output Summary (Last 24 hours) at 02/23/2018 0923 Last data filed at 02/23/2018 0900 Gross per 24 hour  Intake 3383.06 ml  Output 3147 ml  Net 236.06 ml   Filed Weights   10-Feb-2018 0350 02/17/18 0523 02/20/18 0500  Weight: 59.9 kg 58.2 kg 54.9 kg    Examination:  General: Adult female of normal body habitus in NAD HEENT: MM pink/moist, ETT, tongue swelling improved. RN unable to due significant mouth care due to edema.  Neuro: sedated. RASS -3.  CV: s1s2 rrr, no  m/r/g PULM: even/non-labored, lungs bilaterally clear  ZO:XWRUGI:soft, non-tender, bsx4 active  Extremities: warm/dry, no edema  Skin: no rashes or lesions  Resolved Hospital Problem list     Assessment & Plan:   Angioedema / Emergent Airway Obstruction -etiology unclear. Family believes it is due to viscous lidocaine.  -acute onset tongue/neck swelling 11/13.   Sore throat began 11/12 (was also started on heparin at that time due to splenic infarct, magic mouth wash)  P: Continue decadron 4mg  Q12  Pepcid BID  Swelling improved, but still significant, cannot pass oral swabs. Will keep sedated to protect airway. Hopefully can start to reduce this 11/19 if her improvement continues.   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 CRRT started 11/15. P: Nephrology following, appreciate input  Unable to renal biopsy due to need for heparin CRRT per nephrology Trend BMP  Fluid and electrolyte imbalance: NAGMA, hyperchloremia, Hyponatremia  P: CRRT as above Stop free water  Iron Deficiency Anemia P: Trend CBC Transfuse per ICU guidelines  Fever  P: Monitor fever curve / WBC trend Follow off abx  Thrombocytopenia: Baseline 150. - Continue to monitor - May need to evaluate for HIT if worsens.   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 Lendon ColonelHawks -on Plaquenil & Imuran at baseline -negative antiphospholipid, lupus anticoagulant, beta-2 glycoprotein, LDH, uric acid, protein C/S, blood cultures x2 negative  P: Hold plaquenil, imuran. Nephrology recommending a transition to Cellcept once clinically stable.   HTN P: Hold home ACE-I, ? If she should go back on given angioedema  Hold norvasc  Best practice:  Diet: NPO - TF Pain/Anxiety/Delirium protocol (if indicated):  Propofol, fentanyl gtt's VAP protocol (if indicated): yes DVT prophylaxis: Heparin gtt  GI prophylaxis: Pepcid  Glucose control: n/a, monitor while on steroids  Mobility: bedrest  Code Status: full code  Family Communication:  Daughter updated 11/18 am Disposition: ICU   My critical care time excluding procedures: 35 minutes   Joneen RoachPaul Emmanuel Gruenhagen, AGACNP-BC [facility]monary/Critical Care Pager [phone] or [phone]  02/23/2018 9:28 AM

## 2018-02-24 ENCOUNTER — Inpatient Hospital Stay (HOSPITAL_COMMUNITY): Payer: Medicare Other

## 2018-02-24 DIAGNOSIS — E872 Acidosis: Secondary | ICD-10-CM

## 2018-02-24 DIAGNOSIS — I9589 Other hypotension: Secondary | ICD-10-CM

## 2018-02-24 DIAGNOSIS — N179 Acute kidney failure, unspecified: Secondary | ICD-10-CM

## 2018-02-24 LAB — BLOOD CULTURE ID PANEL (REFLEXED)
ACINETOBACTER BAUMANNII: NOT DETECTED
CANDIDA ALBICANS: NOT DETECTED
CANDIDA GLABRATA: NOT DETECTED
CANDIDA KRUSEI: NOT DETECTED
Candida parapsilosis: NOT DETECTED
Candida tropicalis: NOT DETECTED
Carbapenem resistance: NOT DETECTED
ENTEROBACTER CLOACAE COMPLEX: NOT DETECTED
ENTEROCOCCUS SPECIES: NOT DETECTED
ESCHERICHIA COLI: NOT DETECTED
Enterobacteriaceae species: DETECTED — AB
Haemophilus influenzae: NOT DETECTED
KLEBSIELLA OXYTOCA: NOT DETECTED
Klebsiella pneumoniae: DETECTED — AB
LISTERIA MONOCYTOGENES: NOT DETECTED
NEISSERIA MENINGITIDIS: NOT DETECTED
PROTEUS SPECIES: NOT DETECTED
Pseudomonas aeruginosa: NOT DETECTED
STREPTOCOCCUS AGALACTIAE: NOT DETECTED
STREPTOCOCCUS PNEUMONIAE: NOT DETECTED
STREPTOCOCCUS PYOGENES: NOT DETECTED
Serratia marcescens: NOT DETECTED
Staphylococcus aureus (BCID): NOT DETECTED
Staphylococcus species: NOT DETECTED
Streptococcus species: NOT DETECTED

## 2018-02-24 LAB — CBC
HCT: 24.9 % — ABNORMAL LOW (ref 36.0–46.0)
HEMOGLOBIN: 7 g/dL — AB (ref 12.0–15.0)
MCH: 27.5 pg (ref 26.0–34.0)
MCHC: 28.1 g/dL — ABNORMAL LOW (ref 30.0–36.0)
MCV: 97.6 fL (ref 80.0–100.0)
NRBC: 7.1 % — AB (ref 0.0–0.2)
PLATELETS: 59 10*3/uL — AB (ref 150–400)
RBC: 2.55 MIL/uL — AB (ref 3.87–5.11)
RDW: 17.7 % — ABNORMAL HIGH (ref 11.5–15.5)
WBC: 35.4 10*3/uL — ABNORMAL HIGH (ref 4.0–10.5)

## 2018-02-24 LAB — GLUCOSE, CAPILLARY
GLUCOSE-CAPILLARY: 137 mg/dL — AB (ref 70–99)
GLUCOSE-CAPILLARY: 75 mg/dL (ref 70–99)
Glucose-Capillary: 66 mg/dL — ABNORMAL LOW (ref 70–99)
Glucose-Capillary: 155 mg/dL — ABNORMAL HIGH (ref 70–99)

## 2018-02-24 LAB — RENAL FUNCTION PANEL
Albumin: 1.2 g/dL — ABNORMAL LOW (ref 3.5–5.0)
Anion gap: 16 — ABNORMAL HIGH (ref 5–15)
BUN: 14 mg/dL (ref 8–23)
BUN: 12 mg/dL (ref 8–23)
CALCIUM: 6.6 mg/dL — AB (ref 8.9–10.3)
CO2: 17 mmol/L — ABNORMAL LOW (ref 22–32)
CO2: 20 mmol/L — AB (ref 22–32)
Calcium: 7.1 mg/dL — ABNORMAL LOW (ref 8.9–10.3)
Chloride: 102 mmol/L (ref 98–111)
Chloride: 90 mmol/L — ABNORMAL LOW (ref 98–111)
Creatinine, Ser: 1.66 mg/dL — ABNORMAL HIGH (ref 0.44–1.00)
Creatinine, Ser: 1.45 mg/dL — ABNORMAL HIGH (ref 0.44–1.00)
GFR calc Af Amer: 36 mL/min — ABNORMAL LOW
GFR calc non Af Amer: 31 mL/min — ABNORMAL LOW
Glucose, Bld: 74 mg/dL (ref 70–99)
PHOSPHORUS: 10.3 mg/dL — AB (ref 2.5–4.6)
Phosphorus: 6.5 mg/dL — ABNORMAL HIGH (ref 2.5–4.6)
Potassium: 5.8 mmol/L — ABNORMAL HIGH (ref 3.5–5.1)
Potassium: >7.5 mmol/L (ref 3.5–5.1)
Sodium: 135 mmol/L (ref 135–145)
Sodium: 133 mmol/L — ABNORMAL LOW (ref 135–145)

## 2018-02-24 LAB — POCT I-STAT 3, ART BLOOD GAS (G3+)
Acid-base deficit: 11 mmol/L — ABNORMAL HIGH (ref 0.0–2.0)
Acid-base deficit: 8 mmol/L — ABNORMAL HIGH (ref 0.0–2.0)
Acid-base deficit: 11 mmol/L — ABNORMAL HIGH (ref 0.0–2.0)
BICARBONATE: 15.9 mmol/L — AB (ref 20.0–28.0)
BICARBONATE: 19.1 mmol/L — AB (ref 20.0–28.0)
Bicarbonate: 16.6 mmol/L — ABNORMAL LOW (ref 20.0–28.0)
O2 SAT: 97 %
O2 Saturation: 91 %
O2 Saturation: 98 %
PCO2 ART: 38.5 mmHg (ref 32.0–48.0)
PCO2 ART: 46.1 mmHg (ref 32.0–48.0)
PH ART: 7.226 — AB (ref 7.350–7.450)
PH ART: 7.171 — AB (ref 7.350–7.450)
PO2 ART: 70 mmHg — AB (ref 83.0–108.0)
Patient temperature: 97.5
TCO2: 17 mmol/L — ABNORMAL LOW (ref 22–32)
TCO2: 21 mmol/L — ABNORMAL LOW (ref 22–32)
TCO2: 18 mmol/L — AB (ref 22–32)
pCO2 arterial: 45 mmHg (ref 32.0–48.0)
pH, Arterial: 7.22 — ABNORMAL LOW (ref 7.350–7.450)
pO2, Arterial: 112 mmHg — ABNORMAL HIGH (ref 83.0–108.0)
pO2, Arterial: 142 mmHg — ABNORMAL HIGH (ref 83.0–108.0)

## 2018-02-24 LAB — HEPATIC FUNCTION PANEL
ALBUMIN: 1 g/dL — AB (ref 3.5–5.0)
ALK PHOS: 217 U/L — AB (ref 38–126)
ALT: 1959 U/L — ABNORMAL HIGH (ref 0–44)
AST: 2670 U/L — AB (ref 15–41)
Bilirubin, Direct: 0.5 mg/dL — ABNORMAL HIGH (ref 0.0–0.2)
Indirect Bilirubin: 0.7 mg/dL (ref 0.3–0.9)
TOTAL PROTEIN: 4.6 g/dL — AB (ref 6.5–8.1)
Total Bilirubin: 1.2 mg/dL (ref 0.3–1.2)

## 2018-02-24 LAB — DIC (DISSEMINATED INTRAVASCULAR COAGULATION) PANEL
D DIMER QUANT: 18.7 ug{FEU}/mL — AB (ref 0.00–0.50)
INR: 1.93
PROTHROMBIN TIME: 21.8 s — AB (ref 11.4–15.2)

## 2018-02-24 LAB — TRIGLYCERIDES: Triglycerides: 151 mg/dL — ABNORMAL HIGH (ref <150)

## 2018-02-24 LAB — PROCALCITONIN: PROCALCITONIN: 16.33 ng/mL

## 2018-02-24 LAB — DIC (DISSEMINATED INTRAVASCULAR COAGULATION)PANEL
Fibrinogen: 667 mg/dL — ABNORMAL HIGH (ref 210–475)
Platelets: 51 10*3/uL — ABNORMAL LOW (ref 150–400)
Smear Review: NONE SEEN
aPTT: >200 seconds (ref 24–36)

## 2018-02-24 LAB — HEPARIN LEVEL (UNFRACTIONATED)
HEPARIN UNFRACTIONATED: 1.1 [IU]/mL — AB (ref 0.30–0.70)
HEPARIN UNFRACTIONATED: 0.78 [IU]/mL — AB (ref 0.30–0.70)

## 2018-02-24 LAB — COMPLEMENT COMPONENT C1Q: C1Q COMPLEMENT PROTEIN CC1Q: 4.3 mg/dL — AB (ref 11.8–24.4)

## 2018-02-24 LAB — MAGNESIUM: Magnesium: 2.7 mg/dL — ABNORMAL HIGH (ref 1.7–2.4)

## 2018-02-24 LAB — LACTIC ACID, PLASMA
LACTIC ACID, VENOUS: 17.1 mmol/L — AB (ref 0.5–1.9)
Lactic Acid, Venous: 24.6 mmol/L (ref 0.5–1.9)

## 2018-02-24 MED ORDER — SODIUM BICARBONATE 8.4 % IV SOLN
INTRAVENOUS | Status: AC
  Administered 2018-02-24: 50 meq
  Filled 2018-02-24: qty 50

## 2018-02-24 MED ORDER — PIPERACILLIN-TAZOBACTAM IN DEX 2-0.25 GM/50ML IV SOLN
2.2500 g | Freq: Four times a day (QID) | INTRAVENOUS | Status: DC
  Administered 2018-02-24: 2.25 g via INTRAVENOUS
  Filled 2018-02-24 (×2): qty 50

## 2018-02-24 MED ORDER — SODIUM CHLORIDE 0.9 % IV SOLN
0.0000 ug/min | INTRAVENOUS | Status: DC
  Administered 2018-02-24 (×5): 400 ug/min via INTRAVENOUS
  Filled 2018-02-24 (×2): qty 5
  Filled 2018-02-24 (×4): qty 8

## 2018-02-24 MED ORDER — DEXTROSE 50 % IV SOLN
INTRAVENOUS | Status: AC
  Administered 2018-02-24: 25 mL
  Filled 2018-02-24: qty 50

## 2018-02-24 MED ORDER — VANCOMYCIN HCL 500 MG IV SOLR
500.0000 mg | INTRAVENOUS | Status: DC
  Filled 2018-02-24: qty 500

## 2018-02-24 MED ORDER — STERILE WATER FOR INJECTION IV SOLN
INTRAVENOUS | Status: DC
  Administered 2018-02-24: 10:00:00 via INTRAVENOUS_CENTRAL
  Filled 2018-02-24 (×8): qty 150

## 2018-02-24 MED ORDER — VANCOMYCIN HCL IN DEXTROSE 1-5 GM/200ML-% IV SOLN
1000.0000 mg | Freq: Once | INTRAVENOUS | Status: AC
  Administered 2018-02-24: 1000 mg via INTRAVENOUS
  Filled 2018-02-24: qty 200

## 2018-02-24 MED ORDER — STERILE WATER FOR INJECTION IV SOLN
INTRAVENOUS | Status: DC
  Administered 2018-02-24 (×4): via INTRAVENOUS_CENTRAL
  Filled 2018-02-24 (×14): qty 150

## 2018-02-24 MED ORDER — SODIUM BICARBONATE 8.4 % IV SOLN
INTRAVENOUS | Status: AC
  Filled 2018-02-24: qty 50

## 2018-02-24 MED ORDER — HEPARIN (PORCINE) 25000 UT/250ML-% IV SOLN: 600.0000 [IU]/h | INTRAVENOUS | Status: DC

## 2018-02-24 MED ORDER — SODIUM CHLORIDE 0.9 % IV SOLN
2.0000 mg/h | INTRAVENOUS | Status: DC
  Filled 2018-02-24: qty 10

## 2018-02-24 MED ORDER — PRISMASOL BGK 0/2.5 32-2.5 MEQ/L IV SOLN
INTRAVENOUS | Status: DC
  Administered 2018-02-24 (×2): via INTRAVENOUS_CENTRAL
  Filled 2018-02-24 (×8): qty 5000

## 2018-02-24 MED ORDER — SODIUM BICARBONATE 8.4 % IV SOLN
100.0000 meq | Freq: Once | INTRAVENOUS | Status: AC
  Administered 2018-02-24: 100 meq via INTRAVENOUS
  Filled 2018-02-24: qty 100

## 2018-02-24 MED ORDER — HYDROCORTISONE NA SUCCINATE PF 100 MG IJ SOLR
100.0000 mg | Freq: Once | INTRAMUSCULAR | Status: AC
  Administered 2018-02-24: 100 mg via INTRAVENOUS
  Filled 2018-02-24: qty 2

## 2018-02-24 MED ORDER — PRISMASOL BGK 0/2.5 32-2.5 MEQ/L IV SOLN
INTRAVENOUS | Status: DC
  Filled 2018-02-24 (×6): qty 5000

## 2018-02-24 MED ORDER — DEXTROSE 10 % IV SOLN
INTRAVENOUS | Status: DC
  Administered 2018-02-24: 02:00:00 via INTRAVENOUS

## 2018-02-24 MED ORDER — SODIUM CHLORIDE 0.9 % IV SOLN
1.0000 g | Freq: Two times a day (BID) | INTRAVENOUS | Status: DC
  Administered 2018-02-24: 1 g via INTRAVENOUS
  Filled 2018-02-24 (×3): qty 1

## 2018-02-25 LAB — GLUCOSE, CAPILLARY: Glucose-Capillary: <10 mg/dL — CL (ref 70–99)

## 2018-02-25 LAB — HEPARIN INDUCED PLATELET AB (HIT ANTIBODY): Heparin Induced Plt Ab: 0.222 OD (ref 0.000–0.400)

## 2018-02-25 LAB — HAPTOGLOBIN: HAPTOGLOBIN: 149 mg/dL (ref 34–200)

## 2018-02-26 LAB — CULTURE, BLOOD (ROUTINE X 2)
SPECIAL REQUESTS: ADEQUATE
SPECIAL REQUESTS: ADEQUATE

## 2018-02-27 ENCOUNTER — Telehealth: Payer: Self-pay

## 2018-02-27 NOTE — Telephone Encounter (Signed)
On 02/27/18 [facility] ([facility] DC is for cremation.  Patient is a patient of Doctor Agarwala.  DC will be taken to [address] for signature.

## 2018-03-01 ENCOUNTER — Encounter (HOSPITAL_COMMUNITY): Payer: Self-pay | Admitting: Pulmonary Disease

## 2018-03-01 DIAGNOSIS — N179 Acute kidney failure, unspecified: Secondary | ICD-10-CM

## 2018-03-01 DIAGNOSIS — E872 Acidosis: Secondary | ICD-10-CM

## 2018-03-01 DIAGNOSIS — R40243 Glasgow coma scale score 3-8, unspecified time: Secondary | ICD-10-CM

## 2018-03-01 DIAGNOSIS — D696 Thrombocytopenia, unspecified: Secondary | ICD-10-CM

## 2018-03-01 DIAGNOSIS — K559 Vascular disorder of intestine, unspecified: Secondary | ICD-10-CM

## 2018-03-01 DIAGNOSIS — A419 Sepsis, unspecified organism: Secondary | ICD-10-CM

## 2018-03-01 DIAGNOSIS — R6521 Severe sepsis with septic shock: Secondary | ICD-10-CM

## 2018-03-01 DIAGNOSIS — E8729 Other acidosis: Secondary | ICD-10-CM

## 2018-03-01 DIAGNOSIS — Z515 Encounter for palliative care: Secondary | ICD-10-CM

## 2018-03-04 NOTE — Telephone Encounter (Signed)
03/04/18 Went and picked up D/C from Dr.Agarwala at 2100 faxed to [facility] [phone] and Clyde CanterburyLeigh Davis will pick up original on Friday 03/06/18. Pwr .

## 2018-03-08 NOTE — Progress Notes (Signed)
Received call from Versie StarksMelvin A, RN for room [phone].  A chest xray was performed for NG tube placement.  The doctor that read the xray suggested pulling ETT back 3 cm due to fact it was against carina.  After checking xray finding and note from doctor, RT pulled ETT tube back 3 cm to 21 at lip.

## 2018-03-08 NOTE — Progress Notes (Addendum)
Patient ID: Madeline Harper, female   DOB: 03-16-1951, 67 y.o.   MRN: [id number] [facility] Progress Note   Assessment/ Plan:   1. Acute Kidney injury on chronic kidney disease stage III: Suspected to be multifactorial injury with IV contrast/volume depletion with ongoing RAS blockade and intrahospital development of volume depletion/acute angioedema  possibly resulting in ATN.  Serologies raised suspicion of lupus nephritis-possibly diffuse proliferative but renal biopsy precluded at this time by inability to hold anticoagulation.  She is currently on CRRT for clearance/volume management-will make adjustments in prescription as she remains acidemic/hyperkalemic possibly from gut ischemia.  Remains critically ill. 2.  Abdominal pain with nausea/vomiting: CT scan recently done shows evidence of ischemic enteritis of the jejunum for which she is back on anticoagulation with heparin.  She previously had presentation with splenic infarct. 3.  Shock- distributive vs septic: She remains on 3 pressors to support blood pressure-will make adjustments with CRRT prescription-no ultrafiltration and aggressive acid load buffering. 4.  Angioedema/ventilator dependent respiratory failure: Her tongue/pharyngeal swelling noted to have improved with ultrafiltration however this is limited by her hypotension at this time.  I am not sure she would tolerate weaning/SBT with current acidemia.  Subjective:   Events from overnight noted-increased acidosis.   Objective:   BP (!) 132/114   Pulse 87   Temp (!) 97.5 F (36.4 C) (Oral)   Resp 18   Ht 5' (1.524 m)   Wt 54.9 kg   SpO2 100%   BMI 23.64 kg/m   Intake/Output Summary (Last 24 hours) at 03/13/18 0076 Last data filed at March 13, 2018 0700 Gross per 24 hour  Intake 7349.33 ml  Output 2083 ml  Net 5266.33 ml   Weight change:   Physical Exam: Gen: Intubated, appears comfortable  CVS: Pulse regular rhythm, normal rate, S1 and S2 normal Resp:  Anteriorly clear to auscultation, no rales/rhonchi Abd: Soft, flat, nontender Ext: No lower extremity edema appreciated  Imaging: Dg Chest Port 1 View  Result Date: 03/13/2018 CLINICAL DATA:  Respiratory failure. EXAM: PORTABLE CHEST 1 VIEW COMPARISON:  February 23, 2018 FINDINGS: The ETT has been advanced in terminates at the carina. Recommend withdrawing 3 cm. A dialysis catheter remains in the central SVC. No pneumothorax. Mild edema. Mild bibasilar atelectasis. No other acute abnormalities. An OG tube terminates in the stomach. IMPRESSION: 1. The ETT terminates at the carina.  Recommend withdrawing 3 cm. 2. Dialysis catheter, stable. 3. Mild edema and bibasilar atelectasis. These results will be called to the ordering clinician or representative by the Radiologist Assistant, and communication documented in the PACS or zVision Dashboard. Electronically Signed   By: Dorise Bullion III M.D   On: Mar 13, 2018 00:33   Dg Chest Port 1 View  Result Date: 02/23/2018 CLINICAL DATA:  Acute respiratory failure with hypoxia EXAM: PORTABLE CHEST 1 VIEW COMPARISON:  02/22/2018 FINDINGS: Endotracheal tube remains low, approximately 12 mm above the carina, unchanged. Right jugular central venous catheter tip mid SVC unchanged. NG tube in the stomach. Mild bibasilar airspace disease with slight improvement. Right upper lobe infiltrate also slightly improved. Small effusions. IMPRESSION: Support lines remain unchanged in position. Endotracheal tube remains low, 12 mm above the carina Bilateral airspace disease with mild interval improvement. Electronically Signed   By: Franchot Gallo M.D.   On: 02/23/2018 08:19    Labs: BMET Recent Labs  Lab 02/20/18 1741 02/21/18 0504 02/21/18 1809 02/21/18 2330 02/22/18 0322 02/23/18 0405 02/23/18 1546 2018-03-13 0422  NA 142 139 133* 134* 133* 130*  131* 135  K 4.2 4.6 4.3 4.5 4.7 5.0 4.6 5.8*  CL 111 105 101 103 101 100 102 102  CO2 22 24 24   --  24 23 20* 17*   GLUCOSE 108* 102* 127* 148* 140* 155* 137* 74  BUN 78* 60* 31* 35* 36* 28* 26* 14  CREATININE 4.72* 3.82* 1.80* 2.50* 2.30* 1.90* 1.95* 1.66*  CALCIUM 7.4* 7.8* 7.8*  --  7.9* 8.1* 7.7* 7.1*  PHOS 8.4* 6.1* 3.6  3.4  --  3.6  3.6 4.0 3.8 6.5*   CBC Recent Labs  Lab 02/21/18 0504  02/22/18 0322 02/23/18 0405 02/23/18 1255 2018/03/13 0422  WBC 10.7*  --  10.1 10.2  --  35.4*  NEUTROABS 9.8*  --   --   --   --   --   HGB 7.5*   < > 7.9* 8.0* 8.1* 7.0*  HCT 25.7*   < > 27.0* 27.8* 28.3* 24.9*  MCV 91.5  --  90.9 92.1  --  97.6  PLT 148*  --  116* 98*  --  59*   < > = values in this interval not displayed.    Medications:    . chlorhexidine gluconate (MEDLINE KIT)  15 mL Mouth Rinse BID  . Chlorhexidine Gluconate Cloth  6 each Topical Daily  . Chlorhexidine Gluconate Cloth  6 each Topical Q0600  . dexamethasone  4 mg Intravenous Q12H  . feeding supplement (PRO-STAT SUGAR FREE 64)  30 mL Per Tube BID  . fentaNYL (SUBLIMAZE) injection  50 mcg Intravenous Once  . insulin aspart  0-9 Units Subcutaneous Q4H  . mouth rinse  15 mL Mouth Rinse 10 times per day  . multivitamin  15 mL Per Tube Daily  . sodium chloride flush  3 mL Intravenous Q12H   Elmarie Shiley, MD Mar 13, 2018, 8:12 AM

## 2018-03-08 NOTE — Progress Notes (Signed)
  CRITICAL VALUE ALERT  Critical Value: hgb 7, ph 7.2, bicarb 19, k 5.8  Date & Time Notied:  0600  Provider Notified: yes  Orders Received/Actions taken: yes

## 2018-03-08 NOTE — Progress Notes (Signed)
240 ml fentanyl wasted with Rolla FlattenMelvin RN.

## 2018-03-08 NOTE — Progress Notes (Signed)
eLink Physician-Brief Progress Note Patient Name: Madeline KinsmanDiane Calender DOB: 1951-01-19 MRN: [id number]   Date of Service  2017/11/09  HPI/Events of Note  WBC = 35.4 this AM.   eICU Interventions  Will order: 1. Blood cultures X 2.  2. Tracheal aspirate cultures X 2.  3. Vancomycin and Zosyn per pharmacy consultation.      Intervention Category Major Interventions: Infection - evaluation and management  , Eugene 2017/11/09, 6:17 AM

## 2018-03-08 NOTE — Progress Notes (Signed)
ANTICOAGULATION CONSULT NOTE - Follow Up Consult  Pharmacy Consult for Heparin Indication: Splenic infarct  Patient Measurements: Height: 5' (152.4 cm) Weight: 121 lb 0.5 oz (54.9 kg) IBW/kg (Calculated) : 45.5 Heparin Dosing Weight: 55.8 kg  Vital Signs: Temp: 97.5 F (36.4 C) (11/19 0000) Temp Source: Oral (11/19 0000) BP: 119/100 (11/19 0400) Pulse Rate: 89 (11/19 0400)  Labs: Recent Labs    02/22/18 0322 02/23/18 0405 02/23/18 1255 02/23/18 1546 2017-11-05 0422  HGB 7.9* 8.0* 8.1*  --   --   HCT 27.0* 27.8* 28.3*  --   --   PLT 116* 98*  --   --   --   HEPARINUNFRC 0.33 0.41  --   --  1.10*  CREATININE 2.30* 1.90*  --  1.95*  --     Estimated Creatinine Clearance: 22.1 mL/min (A) (by C-G formula based on SCr of 1.95 mg/dL (H)).   Medications:  Infusions:  .  prismasol BGK 4/2.5 500 mL/hr at 2017-11-05 0411  .  prismasol BGK 4/2.5 300 mL/hr at 2017-11-05 0323  . sodium chloride 999 mL/hr at 02/23/18 2056  . dextrose 20 mL/hr at 2017-11-05 0134  . famotidine (PEPCID) IV Stopped (02/23/18 [id number])  . feeding supplement (VITAL AF 1.2 CAL) Stopped (02/23/18 2359)  . fentaNYL infusion INTRAVENOUS 175 mcg/hr (02/23/18 2200)  . heparin 900 Units/hr (02/23/18 2200)  . heparin 999 mL/hr at 02/21/18 1435  . norepinephrine (LEVOPHED) Adult infusion 40 mcg/min (2017-11-05 0106)  . phenylephrine (NEO-SYNEPHRINE) Adult infusion 400 mcg/min (2017-11-05 0341)  . prismasol BGK 4/2.5 1,000 mL/hr at 2017-11-05 0413  . propofol (DIPRIVAN) infusion 45 mcg/kg/min (2017-11-05 0525)  .  sodium bicarbonate (isotonic) infusion in sterile water 100 mL/hr at 2017-11-05 0107  . vasopressin (PITRESSIN) infusion - *FOR SHOCK* 0.03 Units/min (02/23/18 2200)    Assessment: [age] YOF who presented on 11/3 with L-flank pain and was found to have a splenic infarct and now with imaging showing ischemic colitis - currently evaluating for source. Pharmacy consulted to dose Heparin for anticoagulation.   Heparin level  this am 1.1 units/ml  Goal of Therapy:  Heparin level 0.3-0.7 units/ml Monitor platelets by anticoagulation protocol: Yes   Plan:  Decrease heparin infusion to 700 units/hr Check heparin level in 6 hours Daily heparin level and CBC while on heparin Monitor s/sx bleeding  Thanks for allowing pharmacy to be a part of this patient's care.  Talbert CageLora Samanda Buske, PharmD Clinical Pharmacist

## 2018-03-08 NOTE — Progress Notes (Addendum)
CRITICAL VALUE ALERT  Critical Value:  PTT >200  Date & Time Notied:  02/17/2018 1214  Provider Notified: Charlotte SanesNicole Gonzales

## 2018-03-08 NOTE — Progress Notes (Signed)
eLink Physician-Brief Progress Note Patient Name: Madeline KinsmanDiane Harper DOB: 30-Oct-1950 MRN: [id number]   Date of Service  05-24-17  HPI/Events of Note  Multiple issues: 1. Hypotension - BP = 83/34 on Phenylephrine, Norepinephrine and Vasopressin IV infusions. No CVL or CVP, 2. ABG on 30%/PRVC 18/TV 360/P 5 = 7.20/39/73/15  eICU Interventions  Will order: 1. Increase ceiling on Norepinephrine IV infusion to 60 mcg/min. 2. NaHCO3 100 meq IV now. 3. Increase NaHCO3 IV infusion to 100 mL/hour.  4. Repeat ABG at 5 AM. 5. Increase PEEP to 8.     Intervention Category Major Interventions: Acid-Base disturbance - evaluation and management;Respiratory failure - evaluation and management;Hypotension - evaluation and management  Lenell AntuSommer,Steven Eugene 05-24-17, 12:57 AM

## 2018-03-08 NOTE — Progress Notes (Signed)
CRITICAL VALUE ALERT  Critical Value:  Lactic Acid 17.1  Date & Time Notied:  02/14/2018 1238  Provider Notified: Charlotte SanesNicole Gonzales

## 2018-03-08 NOTE — Progress Notes (Signed)
CRITICAL VALUE ALERT  Critical Value:  K 7.5, Glucose <20, Lactic Acid 24.6.   Date & Time Notied:  03/01/2018 1715  Provider Notified: Francee NodalNicole Gonsales  Orders Received/Actions taken: No new orders. Continue current plan of care.

## 2018-03-08 NOTE — Progress Notes (Signed)
ANTICOAGULATION CONSULT NOTE - Follow Up Consult  Pharmacy Consult for Heparin Indication: Splenic infarct  Patient Measurements: Height: 5' (152.4 cm) Weight: 121 lb 0.5 oz (54.9 kg) IBW/kg (Calculated) : 45.5 Heparin Dosing Weight: 55.8 kg  Vital Signs: Temp: 91.4 F (33 C) (11/19 1200) Temp Source: Rectal (11/19 1200) BP: 74/34 (11/19 1200) Pulse Rate: 112 (11/19 1101)  Labs: Recent Labs    02/23/18 0405 02/23/18 1255 02/23/18 1546 06-02-17 0422 06-02-17 1019 06-02-17 1051  HGB 8.0* 8.1*  --  7.0*  --   --   HCT 27.8* 28.3*  --  24.9*  --   --   PLT 98*  --   --  59* 51*  --   APTT  --   --   --   --  >200*  --   LABPROT  --   --   --   --  21.8*  --   INR  --   --   --   --  1.93  --   HEPARINUNFRC 0.41  --   --  1.10*  --  0.78*  CREATININE 1.90*  --  1.95* 1.66*  --   --     Estimated Creatinine Clearance: 25.9 mL/min (A) (by C-G formula based on SCr of 1.66 mg/dL (H)).   Medications:  Infusions:  . sodium chloride 999 mL/hr at 02/23/18 2056  . dextrose 20 mL/hr at 06-02-17 1200  . famotidine (PEPCID) IV Stopped (06-02-17 [id number])  . feeding supplement (VITAL AF 1.2 CAL) Stopped (02/23/18 2359)  . fentaNYL infusion INTRAVENOUS Stopped (06-02-17 1012)  . heparin 999 mL/hr at 02/21/18 1435  . meropenem (MERREM) IV Stopped (06-02-17 1159)  . midazolam (VERSED) infusion    . norepinephrine (LEVOPHED) Adult infusion 70 mcg/min (06-02-17 1200)  . phenylephrine (NEO-SYNEPHRINE) Adult infusion 400 mcg/min (06-02-17 1200)  . prismasol BGK 2/2.5 dialysis solution 1,500 mL/hr at 06-02-17 0914  .  sodium bicarbonate (isotonic) infusion in sterile water 100 mL/hr at 06-02-17 0937  . sodium bicarbonate (isotonic) 1000 mL infusion 400 mL/hr at 06-02-17 0915  . sodium bicarbonate (isotonic) 1000 mL infusion 200 mL/hr at 06-02-17 1023  . [START ON 02/25/2018] vancomycin    . vasopressin (PITRESSIN) infusion - *FOR SHOCK* 0.03 Units/min (06-02-17 1200)    Assessment: [age]  YOF who presented on 11/3 with L-flank pain and was found to have a splenic infarct and now with imaging showing ischemic colitis - currently evaluating for source. Pharmacy consulted to dose Heparin for anticoagulation.   Heparin level remains supratherapeutic at 0.78, on 700 units/hr. Hgb down to 7, plt at 58. INR 1.93 likely related to elevated LFTs (ALT 1959). D-dimer 18.7, fibrinogen 667. Aptt came back elevated at >200 on DIC panel. No s/sx of bleeding. No infusion issues.  Goal of Therapy:  Heparin level 0.3-0.5 units/ml Monitor platelets by anticoagulation protocol: Yes   Plan:  Will reduce goal range given thrombocytopenia  Hold heparin infusion for 30 minutes Will restart heparin infusion at 600 units/hr Obtain heparin level in 8 hours Monitor s/sx bleeding  Madeline MondayKimberly Reem Harper, PharmD Clinical Pharmacist  Pager: [phone] Phone: [phone] 2017-10-25 12:37 PM

## 2018-03-08 NOTE — Progress Notes (Signed)
NAME:  Madeline Harper, MRN:  [id number], DOB:  1950-10-24, LOS: 80 ADMISSION DATE:  02/09/2018, CONSULTATION DATE:  02/18/2018 REFERRING MD:  Dr. Teryl Lucy, CHIEF COMPLAINT:  Angioedema  This note reflects 11/19 midday.   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  Additionally found to have evidence of  ischemic bowel on CT abd and placed on heparin gtt.  Developed acute angioedema 11/13 requiring emergent intubation (? Related to magic mouth wash).   11/18 developed metabolic acidosis.     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  11/14 Tongue swelling improved. UOP dropping, acid base worse. Cr increased.  11/19: severe hypotension refractory to 3 pressors, AG metabolic acidosis.  Severe hepatitis likely 2/2 shock liver.  11/18: overnight developed worsening hypotension and severe metabolic acidosis, requiring high doses of pressors.    Consults:  11/5 ID  11/5 cardiology 11/8 Nephrology 11/12 GI 11/13 PCCM   Procedures:  11/5 TEE >> LVEF 55-60%, no evidence of IE, could not rule out small PFO 11/13 ETT >> 11/15 CRRT >  11/15 started levophed 11/18 started phenylephrine, vasopressin   Significant Diagnostic Tests:  11/3 CT abd/pelvis >> 1. Large subcapsular splenic infarct. Findings may be related to an embolic process possibly originating from the heart such as infective endocarditis. Clinical correlation is recommended. 2. Focal area of wall apparent thickening or thrombus in the midportion of the splenic artery with associated luminal narrowing.  3. Mildly enlarged retroperitoneal and right common iliac lymph nodes. 4. Sigmoid diverticulosis. No bowel obstruction or  active inflammation. Normal appendix. 5. Possible small left ovarian dermoid.  11/5 TEE >> LVEF 55-60%, no evidence of IE, could not rule out small PFO  11/11 CT abd/pelvis >>1. Markedly abnormal loop of proximal jejunum extending into the mid jejunum with an edematous appearance. The wall is markedly thickened. Findings c/w indeterminate enteritis which could be ischemic, infectious, or inflammatory.  2. New left effusion. 3. The opacity underlying the new left effusion could represent atelectasis or pneumonia. 4. Gallbladder is distended but stable.  5. Atherosclerosis in the nonaneurysmal aorta. 6. Suspected splenic infarcts again noted.  CT head/neck 11/13 >> 1. Diffuse edema of the pharynx and larynx, consistent with angioedema.2. Enlarged left parotid and submandibular glands with surrounding edema and thickening of the platysma muscle. This is of uncertain etiology, but may be part of the same process causing the diffuse airway edema.  Micro Data:  11/3 BC x 3 >> negative 11/13 MRSA PCR >>  Antimicrobials:    Interim History / Subjective:  Overnight became profoundly hypotensive requiring high doses of pressors,  Profoundly acidemic. Started on bicarb.  Zosyn and vanc started given her elevated WBC.   ET tube pulled back (was at [facility]).  62m tube feeds from her NG this am, then brown liquid suctioned consistently.    Objective   Blood pressure (!) 39/14, pulse (!) 25, temperature (!) 89.4 F (31.9 C), resp. rate (!) 24, height 5' (1.524 m), weight 54.9 kg, SpO2 (!) 70 %.    Vent Mode: PRVC FiO2 (%):  [30 %-100 %] (P) 100 % Set Rate:  [18 bmp-24 bmp] 24 bmp Vt Set:  [360 mL] 360 mL  PEEP:  [5 cmH20-8 cmH20] 8 cmH20 Plateau Pressure:  [18 cmH20-28 cmH20] 28 cmH20   Intake/Output Summary (Last 24 hours) at March 08, 2018 1911 Last data filed at Mar 08, 2018 1700 Gross per 24 hour  Intake 7563.76 ml  Output 1346 ml  Net 6217.76 ml   Filed Weights   03/07/2018 0350 02/17/18 0523  02/20/18 0500  Weight: 59.9 kg 58.2 kg 54.9 kg    Examination:  General: Adult female, obese, non responsive, on ventilator.  HEENT: MM pink/moist, ETT, tongue swelling.  Neuro: completely non responsive.  Pupils 43m dilated B completely non responsive bilaterally.  No corneal reflexes.  + breathing above ventilator set rate.   CV: s1s2 rrr, no m/r/g PULM: even/non-labored, lungs bilaterally clear  GGY:IRSWNIOEV no evidence of tenderness, Absolutely no bowel sounds.  NG suction with 600 Extremities:cool, no spontaneous movements. No withdrawal to pain  Skin: no rashes or lesions  Resolved Hospital Problem list     Assessment & Plan:   Severe Refractory Shock:  -Sepsis vs acidosis related vs cardiogenic.  Likley multifactorial.  -Bedside echo shows diffusely reduced EF. No effusion.   -Absent bowel sounds, very distended stomach on CXR, 600cc tube feeds from NG followed by brown foul smelling contents - suggests worsening mesenteric ischemia, either from progressing arterial thrombus in setting of active lupus vs poor perfusion in setting of high pressor needs.  Sepsis source likely intraabdominal, possible PNA (CXR not very remarkable).  WBC now 35.4 from 10.2 yesterday.  Unable to image abdomen with CT due to hemodynamic instability, remains on CRRT.  Anion gap acidosis /lactic acidosis - bowel ischemia and or sepsis.  Considered Propofol related infusion syndrome (held all sedation in AM).  Combined met alkalosis/ AG met acidosis (corrected AG actually 24 today, HCO3 actually higher than expected with that AG).   3 pressors at very high doses.   Added Increased dose steroids to full dose for sepsis (hydrocortison 1085mIV q6, received first dose at 1125) Broadened antibiotics from zosyn, changed to meropenem.  Cont vanc.   Cultures pending.  Cont bicarbonate infusion and in CRRT.   Increased RR to 26 to buffer acidosis.  Stopping CRRT would likely lead to imminent death given  profound acidosis, hyperkalemia.    Comatose, fixed dilated pupils:  Noted new change this am of fixed dilated pupils, patient completely non responsive.  Breathing at rate over vent.   Fentanyl and propofol held this am.  Will follow neuro exam.  Concern for large bleed (coagulopathic - thrombocytopenic and elevated INR) vs stroke (known hypercoagulability) vs possible edema/herniation.   Not able to image head due to instability.  Transaminitis: severely elevated, c/w shock liver, poor perfusion related.    Acute on chronic Anemia:  No evidence of hemolysis on labs.   No schistocytes on smear, suggesting not DIC related or TTP.    Thrombocytopenia (59):  Consider HIT, labs sent.  DIC panel sent.  TTP considered, though no schistocytes on smear today.  Likely related to profound sepsis.    Malnutrition;  Hypoalbuminemia, severe.    Splenic infarct: was started on Heparin  Angioedema.    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 HaLenna Gilfordon Plaquenil & Imuran at baseline -negative antiphospholipid, lupus anticoagulant, beta-2 glycoprotein, LDH, uric acid, protein C/S, blood cultures x2 negative  Held plaquenil, imuran recently.    SLE: Recent history - APL labs negative, ANA +, complement low, SM ab high, DS dna high.  Discussed poor prognosis with family.  Plan midday to continue current aggressive care, except DNR if cardiac arrest occurs, no escalation of pressors, no placement of new arterial line (radial line not functioning well now). Discussed the benefits of new art line would be ability to titrate pressors more accurately, but in light of abdominal and neurologic findings, would not likely change her course. Will cont current course and monitor her closely.  Discussed option for comfort care, they would rather continue current plan as outlined above.    Best practice:  Diet: NPO - TF stopped  Pain/Anxiety/Delirium protocol (if indicated):  held VAP protocol (if indicated): yes DVT prophylaxis: Heparin gtt  GI prophylaxis: Glucose control:  Mobility: bedrest  Code Status: DNR Family Communication:  Husband, son, daughter  Disposition: ICU   My critical care time excluding procedures: 1108mn

## 2018-03-08 NOTE — Progress Notes (Signed)
Bag of versed given to Jennette BankerLisa Pharm D

## 2018-03-08 NOTE — Progress Notes (Signed)
RT note-No changes with Ventilator at this time, family at the bedside.

## 2018-03-08 NOTE — Progress Notes (Signed)
PHARMACY - PHYSICIAN COMMUNICATION CRITICAL VALUE ALERT - BLOOD CULTURE IDENTIFICATION (BCID)  Madeline Harper is an 67 y.o. female on day # 1 antibiotics for empiric coverage. Vancomycin and Zosyn begun early morning then Zosyn changed to Meropenem mid-morning, also covering for ischemic bowel.  AKI, on CRRT.   Assessment:  Klebsiella pneumoniae in 1 of 2 bottles, KPC negative.   Ceftriaxone 2 gm IV q24hrs usually suggested for Klebsiella bacteremia, but currently needs broader coverage.  Name of physician (or Provider) Contacted:  Dr. Warrick Parisiangan  Current antibiotics:    Vancomycin 500 mg IV q24hrs and Meropenem 1 gm IV q12hrs.  Changes to prescribed antibiotics recommended:   Continue present antibiotics.  Results for orders placed or performed during the hospital encounter of 02/25/2018  Blood Culture ID Panel (Reflexed) (Collected: 03/06/2018  7:17 AM)  Result Value Ref Range   Enterococcus species NOT DETECTED NOT DETECTED   Listeria monocytogenes NOT DETECTED NOT DETECTED   Staphylococcus species NOT DETECTED NOT DETECTED   Staphylococcus aureus (BCID) NOT DETECTED NOT DETECTED   Streptococcus species NOT DETECTED NOT DETECTED   Streptococcus agalactiae NOT DETECTED NOT DETECTED   Streptococcus pneumoniae NOT DETECTED NOT DETECTED   Streptococcus pyogenes NOT DETECTED NOT DETECTED   Acinetobacter baumannii NOT DETECTED NOT DETECTED   Enterobacteriaceae species DETECTED (A) NOT DETECTED   Enterobacter cloacae complex NOT DETECTED NOT DETECTED   Escherichia coli NOT DETECTED NOT DETECTED   Klebsiella oxytoca NOT DETECTED NOT DETECTED   Klebsiella pneumoniae DETECTED (A) NOT DETECTED   Proteus species NOT DETECTED NOT DETECTED   Serratia marcescens NOT DETECTED NOT DETECTED   Carbapenem resistance NOT DETECTED NOT DETECTED   Haemophilus influenzae NOT DETECTED NOT DETECTED   Neisseria meningitidis NOT DETECTED NOT DETECTED   Pseudomonas aeruginosa NOT DETECTED NOT DETECTED   Candida  albicans NOT DETECTED NOT DETECTED   Candida glabrata NOT DETECTED NOT DETECTED   Candida krusei NOT DETECTED NOT DETECTED   Candida parapsilosis NOT DETECTED NOT DETECTED   Candida tropicalis NOT DETECTED NOT DETECTED    Dennie Fettersgan, Shaunee Mulkern Donovan, RPh Pager: [phone] 02/15/2018  7:53 PM

## 2018-03-08 NOTE — Progress Notes (Signed)
eLink Physician-Brief Progress Note Patient Name: Lester KinsmanDiane Litz DOB: Nov 03, 1950 MRN: [id number]   Date of Service  2017/10/16  HPI/Events of Note  ABG on 50%/PVC18/TV 360/P8 = 7.226/46.1/112.0  eICU Interventions  Continue present ventilator management. Repeat ABG at 9 AM.     Intervention Category Major Interventions: Acid-Base disturbance - evaluation and management;Respiratory failure - evaluation and management  Deundra Furber Eugene 2017/10/16, 6:00 AM

## 2018-03-08 NOTE — Progress Notes (Signed)
Oral and  subglottic secretions were noted to be tan color but ET secretion is clear. Tube feeding was stopped and the external tubing of orogastric tube is unchanged at 69cm . Xray ordered after discussing with Md, showed that OG tube ends in the stomach.Patient moved her bowels and vomited when cleaning her up. Orogastric tube was hooked to intermittent suction and 600ml of gastric content(tube feeding) came out. White count of 35 called in to MD and orders received for antibiotics and  blood and tracheal cultures. Blood pressure have been very labile. Unbale to pull any volume off via crrt. .Marland Kitchen

## 2018-03-08 NOTE — Progress Notes (Signed)
CRITICAL VALUE ALERT  Critical Value:  Ph 7.16, bicarb, 15, pCO2 41 P02 86  Date & Time Notied:  02/23/2018 2110  Provider Notified: yes  Orders Received/Actions taken: Yes

## 2018-03-08 NOTE — Progress Notes (Signed)
Pharmacy Antibiotic Note  Madeline Harper is a 67 y.o. female admitted on 02/19/2018 with splenic infarct, hospital course complicated by acute angioedema and AKI, on CRRT, now w/ WBC up to 35 (from 10 yesterday).  Pharmacy has been consulted for Vancocin and Zosyn dosing.  Plan: Vancomycin 1000mg  x1 then 500mg  IV every 24 hours.  Goal trough 15-20 mcg/mL. Zosyn 2.25g IV every 6 hours.  Height: 5' (152.4 cm) Weight: 121 lb 0.5 oz (54.9 kg) IBW/kg (Calculated) : 45.5  Temp (24hrs), Avg:97.3 F (36.3 C), Min:97.2 F (36.2 C), Max:97.5 F (36.4 C)  Recent Labs  Lab 02/19/18 0259  02/21/18 0504  02/21/18 2330 02/22/18 0322 02/23/18 0405 02/23/18 1546 02/28/2018 0422  WBC 10.6*  --  10.7*  --   --  10.1 10.2  --  35.4*  CREATININE 2.73*   < > 3.82*   < > 2.50* 2.30* 1.90* 1.95* 1.66*   < > = values in this interval not displayed.    Estimated Creatinine Clearance: 25.9 mL/min (A) (by C-G formula based on SCr of 1.66 mg/dL (H)).    Allergies  Allergen Reactions  . Lidocaine Other (See Comments)    Patient experienced angioedema requiring intubation after oral viscous lidocaine administration   . Lyrica [Pregabalin] Swelling    Pedal edema      Thank you for allowing pharmacy to be a part of this patient's care.  Vernard GamblesVeronda Darek Eifler, PharmD, BCPS  02/20/2018 6:39 AM

## 2018-03-08 NOTE — Progress Notes (Signed)
Pt's BP began declining despite pressor support this am.  ABG obtained and results given to CCM MD. MD ordered to increase pressor gtt rates and not to pull fluid on CRRT.  MD and pt's spouse and son had many discussions regarding plan of care.  Family wants to " let her rest." MD and family decided to keep CRRT and gtt rates as they remained.  Pt made a DNR this afternoon.  HR and BP continued to decline. Time of Death declared by Argrwala MD at 1900. Family present at bedside.

## 2018-03-08 DEATH — deceased

## 2018-03-13 NOTE — Telephone Encounter (Signed)
02/27/18 NOTE IS COMPLETE. PWR 03/13/18

## 2018-03-24 ENCOUNTER — Other Ambulatory Visit: Payer: Medicare Other

## 2018-04-08 NOTE — Death Summary Note (Signed)
Madeline Harper was a 68 y.o. female admitted on 03/06/2018 left abdomina pain from splenic infarct.  She developed acute renal failure and required renal replacement therapy.  She was found to have ischemic bowel and started on heparin infusion.  She developed acute angioedema on 02/18/18 and required intubation.  She developed refractory shock with multiorgan failure.  Her clinical status continued to get worse.  Family opted for DNR status.  She subsequently expired on pm 02/07/2018 at 1900.  Final diagnosis: Septic shock Bacteremia with Klebsiella pneumoniae Metabolic acidosis with lactic acidosis Shock liver Acute respiratory failure Acute renal failure with ATN Splenic infarct Ischemic colitis Thrombocytopenia Severe protein calorie malnutrition Angioedema History of mixed connective tissue disease on plaquenil and imuran as an outpatient Systemic lupus erythematosus Pulmonary fibrosis with UIP pattern Hypoglycemia Hyperkalemia Anemia of critical illness  Madeline HellingVineet Saidy Ormand, MD [facility] 03/16/2018, 11:28 PM

## 2019-04-08 IMAGING — DX DG ABD PORTABLE 1V
1 series · 1 of 1 positions shown · non-contrast
Comparison: CT of the abdomen and pelvis performed 02/08/2018

CLINICAL DATA: Acute onset of epigastric abdominal pain.

EXAM:
PORTABLE ABDOMEN - 1 VIEW

[abdomen]
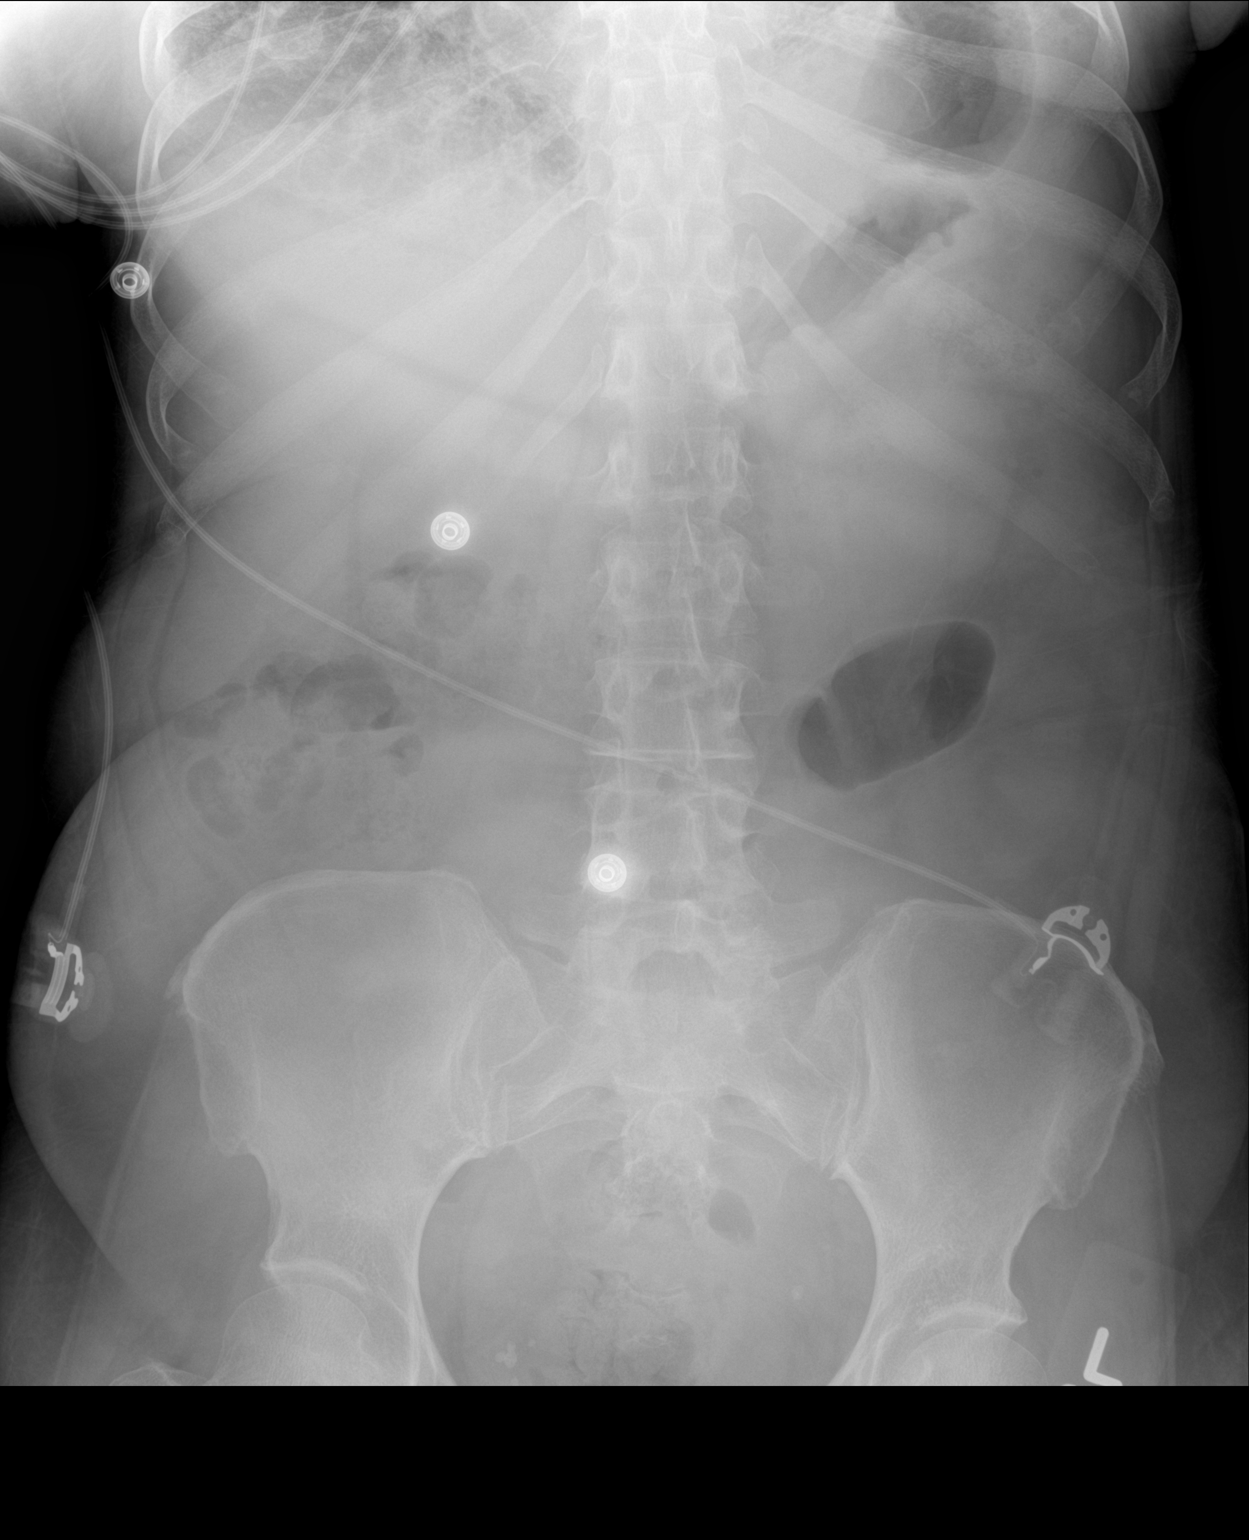

[1 of 1 positions shown; findings below may reference images not displayed]

FINDINGS: The visualized bowel gas pattern is unremarkable. Scattered air and
stool filled loops of colon are seen; no abnormal dilatation of
small bowel loops is seen to suggest small bowel obstruction. No
free intra-abdominal air is identified, though evaluation for free
air is limited on a single supine view.

The visualized osseous structures are within normal limits; the
sacroiliac joints are unremarkable in appearance.
IMPRESSION: Unremarkable bowel gas pattern; no free intra-abdominal air seen.

## 2019-04-08 IMAGING — CT CT ABD-PELV W/O CM
2 of 4 series · 15 of 46 positions shown, 17 images · non-contrast
Comparison: None.

Addendum:
CLINICAL DATA: Upper abdominal pain for 24 hours. Nausea and
vomiting.

EXAM:
CT ABDOMEN AND PELVIS WITHOUT CONTRAST
TECHNIQUE: Multidetector CT imaging of the abdomen and pelvis was performed
following the standard protocol without IV contrast.

[Series 3: ap without · axial · non-contrast · 0.68mm/px · z∈[+801,+1186]mm · 12 of 87 slices shown, 14 images]
[im 5/87  soft-tissue]
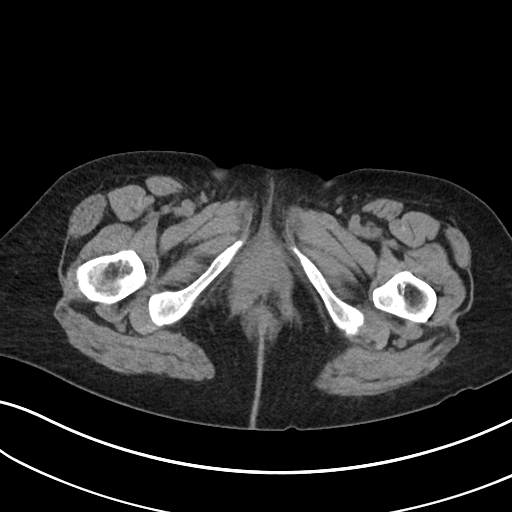
[im 5/87  bone]
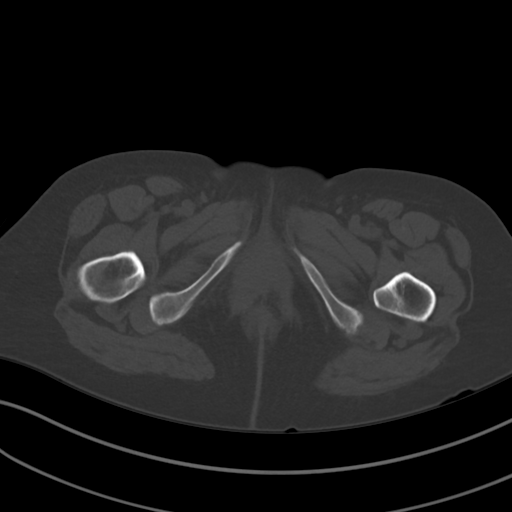
[im 13/87  soft-tissue]
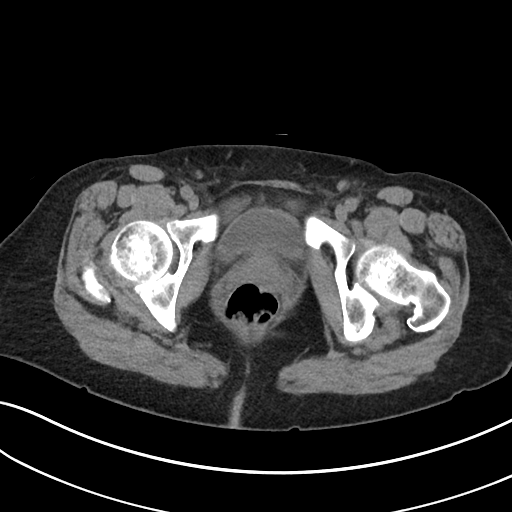
[im 21/87  soft-tissue]
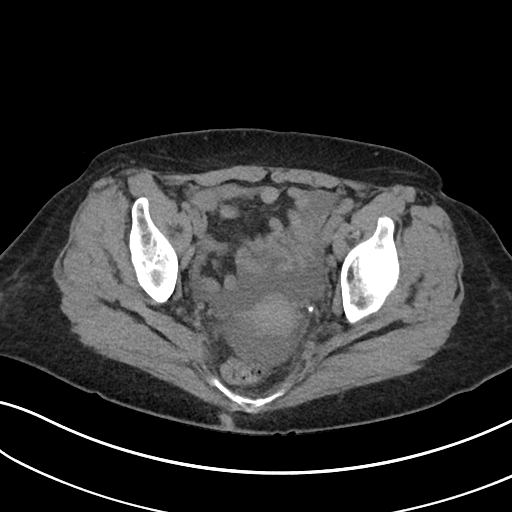
[im 25/87  soft-tissue]
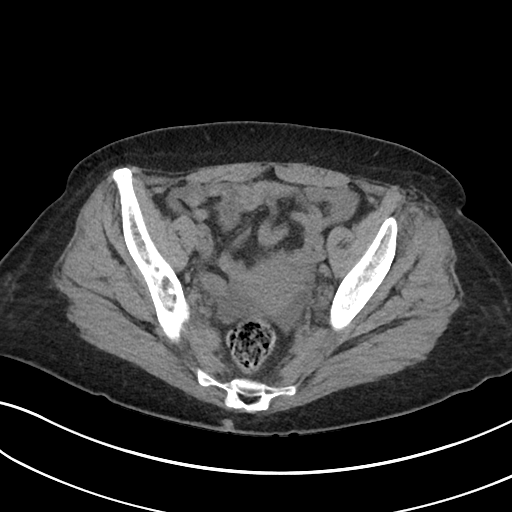
[im 33/87  soft-tissue]
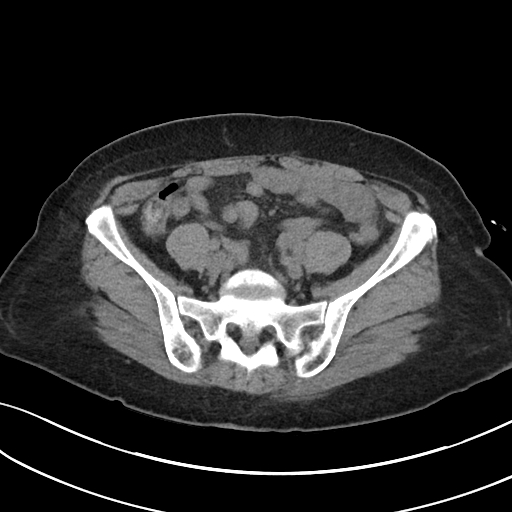
[im 41/87  soft-tissue]
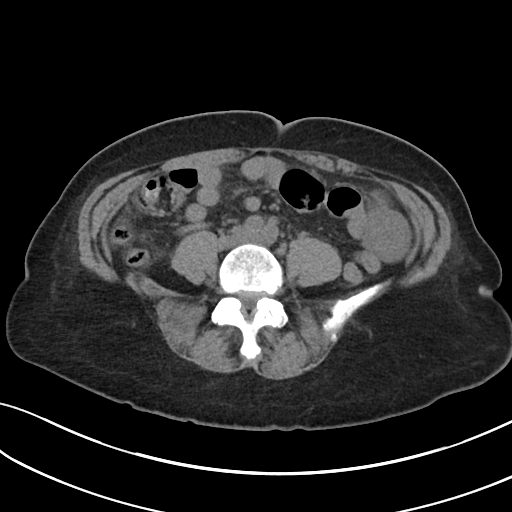
[im 46/87  soft-tissue]
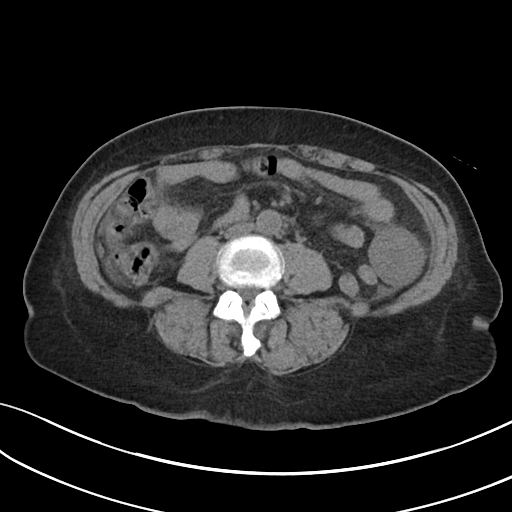
[im 54/87  soft-tissue]
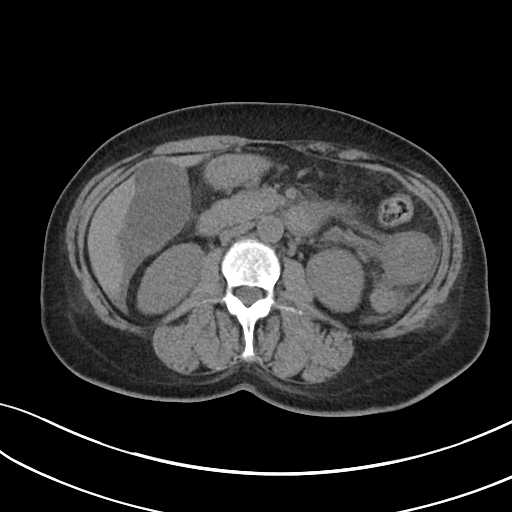
[im 62/87  soft-tissue]
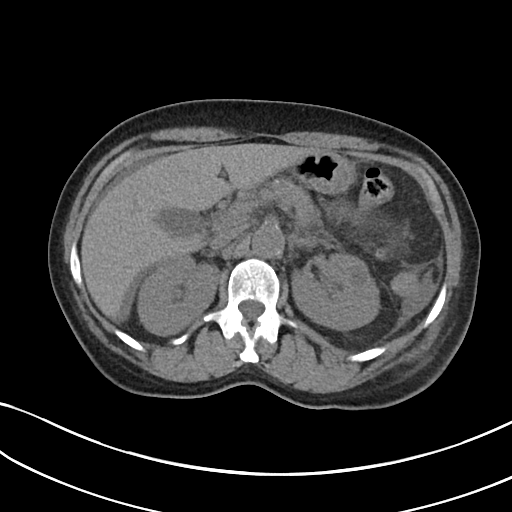
[im 62/87  bone]
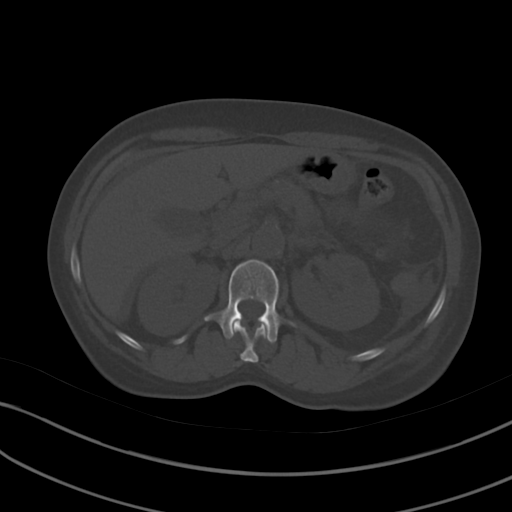
[im 66/87  soft-tissue]
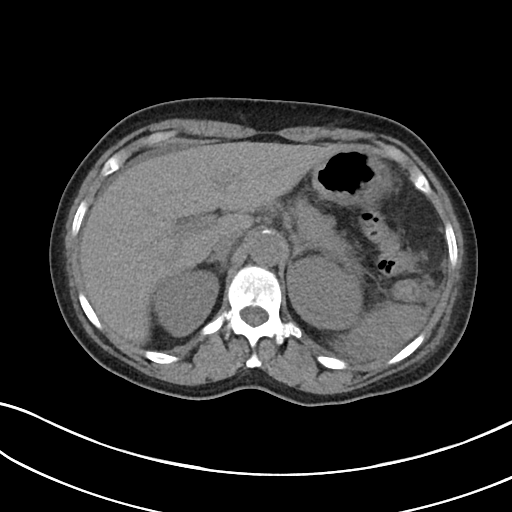
[im 74/87  soft-tissue]
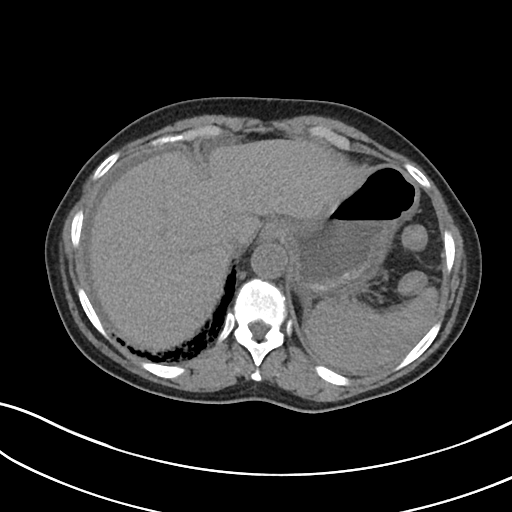
[im 82/87  soft-tissue]
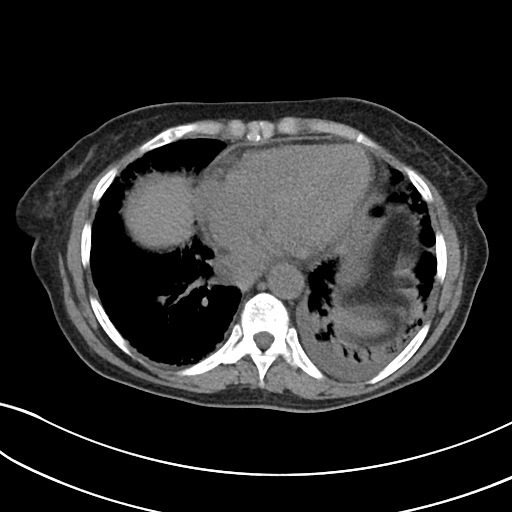

[Series 6: cor · coronal · 0.59mm/px · 3 of 73 slices shown]
[im 25/73  soft-tissue]
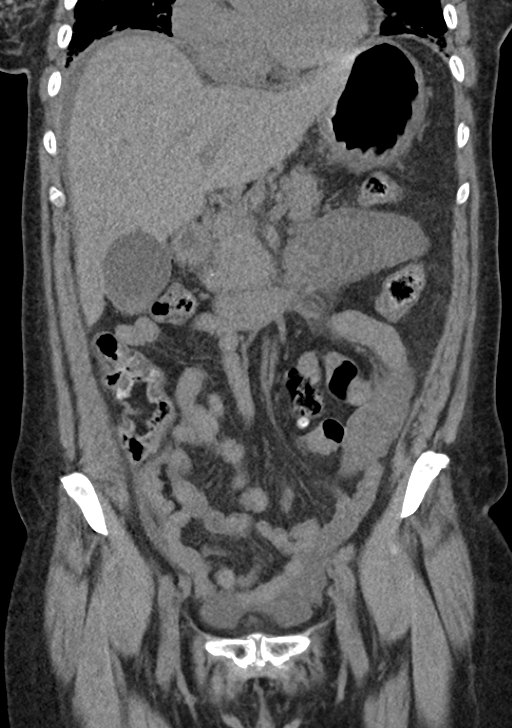
[im 33/73  soft-tissue]
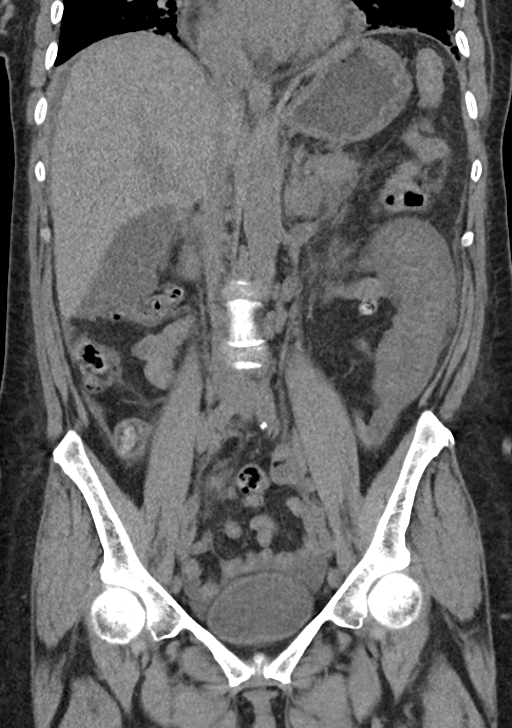
[im 41/73  soft-tissue]
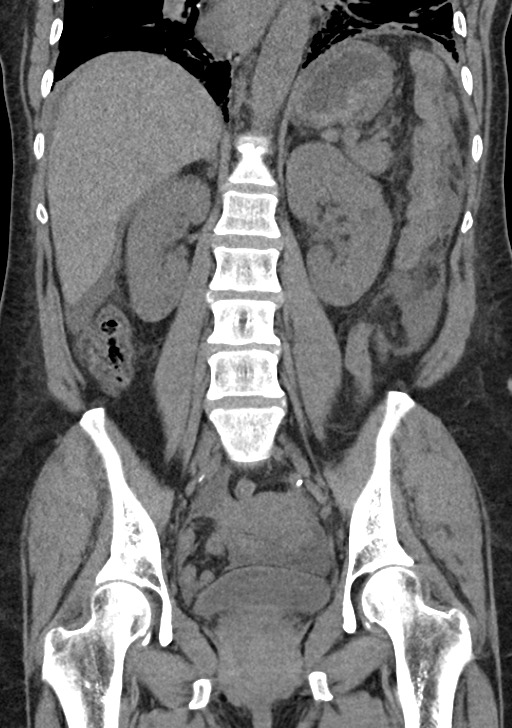

[15 of 46 positions shown; findings below may reference images not displayed]

FINDINGS: Lower chest: There is a new left effusion. There is opacity
associated with the effusion which could represent atelectasis or
pneumonia. Fibrotic changes in the bases. No other abnormalities in
the lower chest.

Hepatobiliary: The liver is normal. The gallbladder is distended but
this is a stable finding.

Pancreas: Unremarkable. No pancreatic ductal dilatation or
surrounding inflammatory changes.

Spleen: The splenic infarcts described on the comparison study are
much less conspicuous on this unenhanced study but do persist.

Adrenals/Urinary Tract: Adrenal glands are normal. No hydronephrosis
or perinephric stranding. A cyst is seen in the left kidney. No
ureterectasis or ureteral stones. The bladder is normal.

Stomach/Bowel: The stomach is normal. There is a markedly abnormal
loop of proximal jejunum as seen on series 3, image 30 which appears
edematous and thick walled. This abnormal loop extends into the left
lower quadrant before returning to normal caliber. The remainder of
the small bowel is normal. The colon is unremarkable. The appendix
is normal in appearance.

Vascular/Lymphatic: Atherosclerotic changes in the nonaneurysmal
aorta. No change in lymph nodes.

Reproductive: Uterus and bilateral adnexa are unremarkable.

Other: New ascites in the abdomen.  No free air.

Musculoskeletal: No acute or significant osseous findings.
IMPRESSION: 1. Markedly abnormal loop of proximal jejunum extending into the mid
jejunum with an edematous appearance. The wall is markedly
thickened. Findings are consistent with indeterminate enteritis
which could be ischemic, infectious, or inflammatory. Given the
possibility of embolic disease on the recent comparison, ischemic
enteritis should be considered.
2. New left effusion.
3. The opacity underlying the new left effusion could represent
atelectasis or pneumonia.
4. Gallbladder is distended but stable. Ultrasound could better
evaluate if warranted.
5. Atherosclerosis in the nonaneurysmal aorta.
6. Suspected splenic infarcts again noted.

Findings being called to the referring clinical team.

ADDENDUM:
Findings discussed with Zivko Andrej Basara, NP.

*** End of Addendum ***
